# Patient Record
Sex: Male | Born: 1945 | Race: White | Hispanic: No | Marital: Married | Smoking: Never smoker
Health system: Southern US, Community
[De-identification: ages and names within clinical notes are randomized; demographics above are authoritative.]

## PROBLEM LIST (undated history)

## (undated) DIAGNOSIS — N189 Chronic kidney disease, unspecified: Secondary | ICD-10-CM

## (undated) DIAGNOSIS — I82409 Acute embolism and thrombosis of unspecified deep veins of unspecified lower extremity: Secondary | ICD-10-CM

## (undated) DIAGNOSIS — I10 Essential (primary) hypertension: Secondary | ICD-10-CM

## (undated) DIAGNOSIS — I4891 Unspecified atrial fibrillation: Secondary | ICD-10-CM

## (undated) DIAGNOSIS — C801 Malignant (primary) neoplasm, unspecified: Secondary | ICD-10-CM

## (undated) DIAGNOSIS — R809 Proteinuria, unspecified: Secondary | ICD-10-CM

## (undated) DIAGNOSIS — Z86711 Personal history of pulmonary embolism: Secondary | ICD-10-CM

## (undated) DIAGNOSIS — M1990 Unspecified osteoarthritis, unspecified site: Secondary | ICD-10-CM

## (undated) DIAGNOSIS — G4730 Sleep apnea, unspecified: Secondary | ICD-10-CM

## (undated) DIAGNOSIS — J189 Pneumonia, unspecified organism: Secondary | ICD-10-CM

## (undated) HISTORY — PX: MENISCUS REPAIR: SHX5179

## (undated) HISTORY — DX: Chronic kidney disease, unspecified: N18.9

## (undated) HISTORY — DX: Proteinuria, unspecified: R80.9

## (undated) HISTORY — DX: Personal history of pulmonary embolism: Z86.711

## (undated) HISTORY — DX: Unspecified atrial fibrillation: I48.91

## (undated) HISTORY — PX: CATARACT EXTRACTION: SUR2

## (undated) HISTORY — DX: Acute embolism and thrombosis of unspecified deep veins of unspecified lower extremity: I82.409

## (undated) HISTORY — PX: OTHER SURGICAL HISTORY: SHX169

## (undated) HISTORY — PX: ROTATOR CUFF REPAIR: SHX139

---

## 2007-01-20 ENCOUNTER — Encounter: Admission: RE | Admit: 2007-01-20 | Discharge: 2007-01-20 | Payer: Self-pay | Admitting: Family Medicine

## 2007-01-25 ENCOUNTER — Encounter: Admission: RE | Admit: 2007-01-25 | Discharge: 2007-01-25 | Payer: Self-pay | Admitting: Family Medicine

## 2007-03-01 ENCOUNTER — Encounter: Admission: RE | Admit: 2007-03-01 | Discharge: 2007-03-01 | Payer: Self-pay | Admitting: Family Medicine

## 2007-12-17 ENCOUNTER — Encounter: Admission: RE | Admit: 2007-12-17 | Discharge: 2007-12-17 | Payer: Self-pay | Admitting: Family Medicine

## 2014-08-28 DIAGNOSIS — B356 Tinea cruris: Secondary | ICD-10-CM | POA: Diagnosis not present

## 2014-08-28 DIAGNOSIS — Z6835 Body mass index (BMI) 35.0-35.9, adult: Secondary | ICD-10-CM | POA: Diagnosis not present

## 2014-08-28 DIAGNOSIS — R1310 Dysphagia, unspecified: Secondary | ICD-10-CM | POA: Diagnosis not present

## 2014-08-28 DIAGNOSIS — Z0001 Encounter for general adult medical examination with abnormal findings: Secondary | ICD-10-CM | POA: Diagnosis not present

## 2014-08-28 DIAGNOSIS — E669 Obesity, unspecified: Secondary | ICD-10-CM | POA: Diagnosis not present

## 2014-08-28 DIAGNOSIS — M109 Gout, unspecified: Secondary | ICD-10-CM | POA: Diagnosis not present

## 2014-08-28 DIAGNOSIS — L989 Disorder of the skin and subcutaneous tissue, unspecified: Secondary | ICD-10-CM | POA: Diagnosis not present

## 2014-08-28 DIAGNOSIS — R7301 Impaired fasting glucose: Secondary | ICD-10-CM | POA: Diagnosis not present

## 2014-08-29 ENCOUNTER — Other Ambulatory Visit: Payer: Self-pay | Admitting: Family Medicine

## 2014-08-29 DIAGNOSIS — R1310 Dysphagia, unspecified: Secondary | ICD-10-CM

## 2014-08-31 ENCOUNTER — Ambulatory Visit
Admission: RE | Admit: 2014-08-31 | Discharge: 2014-08-31 | Disposition: A | Discharge status: Home / Self Care | Payer: Commercial Managed Care - HMO | Source: Ambulatory Visit | Attending: Family Medicine | Admitting: Family Medicine

## 2014-08-31 DIAGNOSIS — R1310 Dysphagia, unspecified: Secondary | ICD-10-CM | POA: Diagnosis not present

## 2014-09-28 DIAGNOSIS — I10 Essential (primary) hypertension: Secondary | ICD-10-CM | POA: Diagnosis not present

## 2014-09-28 DIAGNOSIS — C4441 Basal cell carcinoma of skin of scalp and neck: Secondary | ICD-10-CM | POA: Diagnosis not present

## 2015-02-05 DIAGNOSIS — R7301 Impaired fasting glucose: Secondary | ICD-10-CM | POA: Diagnosis not present

## 2015-02-05 DIAGNOSIS — Z6834 Body mass index (BMI) 34.0-34.9, adult: Secondary | ICD-10-CM | POA: Diagnosis not present

## 2015-02-05 DIAGNOSIS — I10 Essential (primary) hypertension: Secondary | ICD-10-CM | POA: Diagnosis not present

## 2015-02-05 DIAGNOSIS — E669 Obesity, unspecified: Secondary | ICD-10-CM | POA: Diagnosis not present

## 2015-02-05 DIAGNOSIS — M109 Gout, unspecified: Secondary | ICD-10-CM | POA: Diagnosis not present

## 2015-04-22 DIAGNOSIS — H1033 Unspecified acute conjunctivitis, bilateral: Secondary | ICD-10-CM | POA: Diagnosis not present

## 2015-10-03 DIAGNOSIS — E669 Obesity, unspecified: Secondary | ICD-10-CM | POA: Diagnosis not present

## 2015-10-03 DIAGNOSIS — G4700 Insomnia, unspecified: Secondary | ICD-10-CM | POA: Diagnosis not present

## 2015-10-03 DIAGNOSIS — Z0000 Encounter for general adult medical examination without abnormal findings: Secondary | ICD-10-CM | POA: Diagnosis not present

## 2015-10-03 DIAGNOSIS — R7301 Impaired fasting glucose: Secondary | ICD-10-CM | POA: Diagnosis not present

## 2015-10-03 DIAGNOSIS — M109 Gout, unspecified: Secondary | ICD-10-CM | POA: Diagnosis not present

## 2015-10-03 DIAGNOSIS — Z6834 Body mass index (BMI) 34.0-34.9, adult: Secondary | ICD-10-CM | POA: Diagnosis not present

## 2015-10-03 DIAGNOSIS — Z125 Encounter for screening for malignant neoplasm of prostate: Secondary | ICD-10-CM | POA: Diagnosis not present

## 2015-10-03 DIAGNOSIS — Z1211 Encounter for screening for malignant neoplasm of colon: Secondary | ICD-10-CM | POA: Diagnosis not present

## 2015-10-03 DIAGNOSIS — I10 Essential (primary) hypertension: Secondary | ICD-10-CM | POA: Diagnosis not present

## 2015-12-04 DIAGNOSIS — Z125 Encounter for screening for malignant neoplasm of prostate: Secondary | ICD-10-CM | POA: Diagnosis not present

## 2016-05-06 DIAGNOSIS — G4700 Insomnia, unspecified: Secondary | ICD-10-CM | POA: Diagnosis not present

## 2016-05-06 DIAGNOSIS — R7301 Impaired fasting glucose: Secondary | ICD-10-CM | POA: Diagnosis not present

## 2016-05-06 DIAGNOSIS — R0681 Apnea, not elsewhere classified: Secondary | ICD-10-CM | POA: Diagnosis not present

## 2016-05-06 DIAGNOSIS — E7800 Pure hypercholesterolemia, unspecified: Secondary | ICD-10-CM | POA: Diagnosis not present

## 2016-05-06 DIAGNOSIS — M109 Gout, unspecified: Secondary | ICD-10-CM | POA: Diagnosis not present

## 2016-05-06 DIAGNOSIS — I10 Essential (primary) hypertension: Secondary | ICD-10-CM | POA: Diagnosis not present

## 2016-05-06 DIAGNOSIS — E669 Obesity, unspecified: Secondary | ICD-10-CM | POA: Diagnosis not present

## 2016-05-06 DIAGNOSIS — Z6834 Body mass index (BMI) 34.0-34.9, adult: Secondary | ICD-10-CM | POA: Diagnosis not present

## 2016-07-22 DIAGNOSIS — G4700 Insomnia, unspecified: Secondary | ICD-10-CM | POA: Diagnosis not present

## 2016-07-22 DIAGNOSIS — R0681 Apnea, not elsewhere classified: Secondary | ICD-10-CM | POA: Diagnosis not present

## 2016-08-04 DIAGNOSIS — G4733 Obstructive sleep apnea (adult) (pediatric): Secondary | ICD-10-CM | POA: Diagnosis not present

## 2016-08-05 DIAGNOSIS — G4733 Obstructive sleep apnea (adult) (pediatric): Secondary | ICD-10-CM | POA: Diagnosis not present

## 2016-08-27 DIAGNOSIS — G4733 Obstructive sleep apnea (adult) (pediatric): Secondary | ICD-10-CM | POA: Diagnosis not present

## 2016-09-27 DIAGNOSIS — G4733 Obstructive sleep apnea (adult) (pediatric): Secondary | ICD-10-CM | POA: Diagnosis not present

## 2016-10-25 DIAGNOSIS — G4733 Obstructive sleep apnea (adult) (pediatric): Secondary | ICD-10-CM | POA: Diagnosis not present

## 2016-11-17 DIAGNOSIS — R0902 Hypoxemia: Secondary | ICD-10-CM | POA: Diagnosis not present

## 2016-11-17 DIAGNOSIS — G4733 Obstructive sleep apnea (adult) (pediatric): Secondary | ICD-10-CM | POA: Diagnosis not present

## 2016-11-25 DIAGNOSIS — G4733 Obstructive sleep apnea (adult) (pediatric): Secondary | ICD-10-CM | POA: Diagnosis not present

## 2016-12-08 DIAGNOSIS — Z1159 Encounter for screening for other viral diseases: Secondary | ICD-10-CM | POA: Diagnosis not present

## 2016-12-08 DIAGNOSIS — Z0000 Encounter for general adult medical examination without abnormal findings: Secondary | ICD-10-CM | POA: Diagnosis not present

## 2016-12-08 DIAGNOSIS — G4700 Insomnia, unspecified: Secondary | ICD-10-CM | POA: Diagnosis not present

## 2016-12-08 DIAGNOSIS — Z125 Encounter for screening for malignant neoplasm of prostate: Secondary | ICD-10-CM | POA: Diagnosis not present

## 2016-12-08 DIAGNOSIS — M109 Gout, unspecified: Secondary | ICD-10-CM | POA: Diagnosis not present

## 2016-12-08 DIAGNOSIS — R7301 Impaired fasting glucose: Secondary | ICD-10-CM | POA: Diagnosis not present

## 2016-12-08 DIAGNOSIS — I10 Essential (primary) hypertension: Secondary | ICD-10-CM | POA: Diagnosis not present

## 2016-12-08 DIAGNOSIS — G4733 Obstructive sleep apnea (adult) (pediatric): Secondary | ICD-10-CM | POA: Diagnosis not present

## 2016-12-08 DIAGNOSIS — E669 Obesity, unspecified: Secondary | ICD-10-CM | POA: Diagnosis not present

## 2016-12-08 DIAGNOSIS — L570 Actinic keratosis: Secondary | ICD-10-CM | POA: Diagnosis not present

## 2016-12-25 DIAGNOSIS — G4733 Obstructive sleep apnea (adult) (pediatric): Secondary | ICD-10-CM | POA: Diagnosis not present

## 2017-01-25 DIAGNOSIS — G4733 Obstructive sleep apnea (adult) (pediatric): Secondary | ICD-10-CM | POA: Diagnosis not present

## 2017-02-02 DIAGNOSIS — K5730 Diverticulosis of large intestine without perforation or abscess without bleeding: Secondary | ICD-10-CM | POA: Diagnosis not present

## 2017-02-02 DIAGNOSIS — D175 Benign lipomatous neoplasm of intra-abdominal organs: Secondary | ICD-10-CM | POA: Diagnosis not present

## 2017-02-02 DIAGNOSIS — Z1211 Encounter for screening for malignant neoplasm of colon: Secondary | ICD-10-CM | POA: Diagnosis not present

## 2017-02-24 DIAGNOSIS — G4733 Obstructive sleep apnea (adult) (pediatric): Secondary | ICD-10-CM | POA: Diagnosis not present

## 2017-02-27 DIAGNOSIS — G4733 Obstructive sleep apnea (adult) (pediatric): Secondary | ICD-10-CM | POA: Diagnosis not present

## 2017-03-27 DIAGNOSIS — G4733 Obstructive sleep apnea (adult) (pediatric): Secondary | ICD-10-CM | POA: Diagnosis not present

## 2017-04-27 DIAGNOSIS — G4733 Obstructive sleep apnea (adult) (pediatric): Secondary | ICD-10-CM | POA: Diagnosis not present

## 2017-05-05 DIAGNOSIS — H04123 Dry eye syndrome of bilateral lacrimal glands: Secondary | ICD-10-CM | POA: Diagnosis not present

## 2017-05-05 DIAGNOSIS — H25013 Cortical age-related cataract, bilateral: Secondary | ICD-10-CM | POA: Diagnosis not present

## 2017-05-05 DIAGNOSIS — H47092 Other disorders of optic nerve, not elsewhere classified, left eye: Secondary | ICD-10-CM | POA: Diagnosis not present

## 2017-05-05 DIAGNOSIS — H11423 Conjunctival edema, bilateral: Secondary | ICD-10-CM | POA: Diagnosis not present

## 2017-05-05 DIAGNOSIS — H353131 Nonexudative age-related macular degeneration, bilateral, early dry stage: Secondary | ICD-10-CM | POA: Diagnosis not present

## 2017-05-05 DIAGNOSIS — H18413 Arcus senilis, bilateral: Secondary | ICD-10-CM | POA: Diagnosis not present

## 2017-05-05 DIAGNOSIS — H11041 Peripheral pterygium, stationary, right eye: Secondary | ICD-10-CM | POA: Diagnosis not present

## 2017-05-05 DIAGNOSIS — H40013 Open angle with borderline findings, low risk, bilateral: Secondary | ICD-10-CM | POA: Diagnosis not present

## 2017-05-05 DIAGNOSIS — H11823 Conjunctivochalasis, bilateral: Secondary | ICD-10-CM | POA: Diagnosis not present

## 2017-05-27 DIAGNOSIS — G4733 Obstructive sleep apnea (adult) (pediatric): Secondary | ICD-10-CM | POA: Diagnosis not present

## 2017-06-09 DIAGNOSIS — H401424 Capsular glaucoma with pseudoexfoliation of lens, left eye, indeterminate stage: Secondary | ICD-10-CM | POA: Diagnosis not present

## 2017-06-09 DIAGNOSIS — H43812 Vitreous degeneration, left eye: Secondary | ICD-10-CM | POA: Diagnosis not present

## 2017-06-09 DIAGNOSIS — H2512 Age-related nuclear cataract, left eye: Secondary | ICD-10-CM | POA: Diagnosis not present

## 2017-06-09 DIAGNOSIS — H353121 Nonexudative age-related macular degeneration, left eye, early dry stage: Secondary | ICD-10-CM | POA: Diagnosis not present

## 2017-06-09 DIAGNOSIS — H2513 Age-related nuclear cataract, bilateral: Secondary | ICD-10-CM | POA: Diagnosis not present

## 2017-06-09 DIAGNOSIS — H2511 Age-related nuclear cataract, right eye: Secondary | ICD-10-CM | POA: Diagnosis not present

## 2017-06-15 DIAGNOSIS — R7301 Impaired fasting glucose: Secondary | ICD-10-CM | POA: Diagnosis not present

## 2017-06-15 DIAGNOSIS — I10 Essential (primary) hypertension: Secondary | ICD-10-CM | POA: Diagnosis not present

## 2017-06-15 DIAGNOSIS — G4733 Obstructive sleep apnea (adult) (pediatric): Secondary | ICD-10-CM | POA: Diagnosis not present

## 2017-06-15 DIAGNOSIS — M109 Gout, unspecified: Secondary | ICD-10-CM | POA: Diagnosis not present

## 2017-06-15 DIAGNOSIS — Z6835 Body mass index (BMI) 35.0-35.9, adult: Secondary | ICD-10-CM | POA: Diagnosis not present

## 2017-06-18 DIAGNOSIS — H2512 Age-related nuclear cataract, left eye: Secondary | ICD-10-CM | POA: Diagnosis not present

## 2017-06-27 DIAGNOSIS — G4733 Obstructive sleep apnea (adult) (pediatric): Secondary | ICD-10-CM | POA: Diagnosis not present

## 2017-08-25 DIAGNOSIS — H2511 Age-related nuclear cataract, right eye: Secondary | ICD-10-CM | POA: Diagnosis not present

## 2017-08-27 DIAGNOSIS — H2511 Age-related nuclear cataract, right eye: Secondary | ICD-10-CM | POA: Diagnosis not present

## 2017-08-28 DIAGNOSIS — G4733 Obstructive sleep apnea (adult) (pediatric): Secondary | ICD-10-CM | POA: Diagnosis not present

## 2017-11-19 DIAGNOSIS — M549 Dorsalgia, unspecified: Secondary | ICD-10-CM | POA: Diagnosis not present

## 2017-11-23 DIAGNOSIS — G4733 Obstructive sleep apnea (adult) (pediatric): Secondary | ICD-10-CM | POA: Diagnosis not present

## 2017-12-01 DIAGNOSIS — Z961 Presence of intraocular lens: Secondary | ICD-10-CM | POA: Diagnosis not present

## 2017-12-04 DIAGNOSIS — G4733 Obstructive sleep apnea (adult) (pediatric): Secondary | ICD-10-CM | POA: Diagnosis not present

## 2018-01-04 DIAGNOSIS — M109 Gout, unspecified: Secondary | ICD-10-CM | POA: Diagnosis not present

## 2018-01-04 DIAGNOSIS — Z0000 Encounter for general adult medical examination without abnormal findings: Secondary | ICD-10-CM | POA: Diagnosis not present

## 2018-01-04 DIAGNOSIS — G4733 Obstructive sleep apnea (adult) (pediatric): Secondary | ICD-10-CM | POA: Diagnosis not present

## 2018-01-04 DIAGNOSIS — E669 Obesity, unspecified: Secondary | ICD-10-CM | POA: Diagnosis not present

## 2018-01-04 DIAGNOSIS — Z125 Encounter for screening for malignant neoplasm of prostate: Secondary | ICD-10-CM | POA: Diagnosis not present

## 2018-01-04 DIAGNOSIS — Z6834 Body mass index (BMI) 34.0-34.9, adult: Secondary | ICD-10-CM | POA: Diagnosis not present

## 2018-01-04 DIAGNOSIS — R7301 Impaired fasting glucose: Secondary | ICD-10-CM | POA: Diagnosis not present

## 2018-01-04 DIAGNOSIS — I10 Essential (primary) hypertension: Secondary | ICD-10-CM | POA: Diagnosis not present

## 2018-07-09 DIAGNOSIS — R7301 Impaired fasting glucose: Secondary | ICD-10-CM | POA: Diagnosis not present

## 2018-07-09 DIAGNOSIS — I10 Essential (primary) hypertension: Secondary | ICD-10-CM | POA: Diagnosis not present

## 2018-07-09 DIAGNOSIS — M109 Gout, unspecified: Secondary | ICD-10-CM | POA: Diagnosis not present

## 2018-07-09 DIAGNOSIS — D235 Other benign neoplasm of skin of trunk: Secondary | ICD-10-CM | POA: Diagnosis not present

## 2018-07-09 DIAGNOSIS — E669 Obesity, unspecified: Secondary | ICD-10-CM | POA: Diagnosis not present

## 2018-07-09 DIAGNOSIS — Z6835 Body mass index (BMI) 35.0-35.9, adult: Secondary | ICD-10-CM | POA: Diagnosis not present

## 2018-11-29 DIAGNOSIS — G4733 Obstructive sleep apnea (adult) (pediatric): Secondary | ICD-10-CM | POA: Diagnosis not present

## 2019-01-20 DIAGNOSIS — G4733 Obstructive sleep apnea (adult) (pediatric): Secondary | ICD-10-CM | POA: Diagnosis not present

## 2019-02-03 DIAGNOSIS — M109 Gout, unspecified: Secondary | ICD-10-CM | POA: Diagnosis not present

## 2019-02-03 DIAGNOSIS — I10 Essential (primary) hypertension: Secondary | ICD-10-CM | POA: Diagnosis not present

## 2019-02-03 DIAGNOSIS — R7301 Impaired fasting glucose: Secondary | ICD-10-CM | POA: Diagnosis not present

## 2019-02-03 DIAGNOSIS — Z125 Encounter for screening for malignant neoplasm of prostate: Secondary | ICD-10-CM | POA: Diagnosis not present

## 2019-02-07 DIAGNOSIS — G4733 Obstructive sleep apnea (adult) (pediatric): Secondary | ICD-10-CM | POA: Diagnosis not present

## 2019-02-07 DIAGNOSIS — M549 Dorsalgia, unspecified: Secondary | ICD-10-CM | POA: Diagnosis not present

## 2019-02-07 DIAGNOSIS — M109 Gout, unspecified: Secondary | ICD-10-CM | POA: Diagnosis not present

## 2019-02-07 DIAGNOSIS — I10 Essential (primary) hypertension: Secondary | ICD-10-CM | POA: Diagnosis not present

## 2019-02-07 DIAGNOSIS — N179 Acute kidney failure, unspecified: Secondary | ICD-10-CM | POA: Diagnosis not present

## 2019-02-07 DIAGNOSIS — Z0000 Encounter for general adult medical examination without abnormal findings: Secondary | ICD-10-CM | POA: Diagnosis not present

## 2019-02-07 DIAGNOSIS — Z125 Encounter for screening for malignant neoplasm of prostate: Secondary | ICD-10-CM | POA: Diagnosis not present

## 2019-02-07 DIAGNOSIS — R7301 Impaired fasting glucose: Secondary | ICD-10-CM | POA: Diagnosis not present

## 2019-03-15 DIAGNOSIS — D2262 Melanocytic nevi of left upper limb, including shoulder: Secondary | ICD-10-CM | POA: Diagnosis not present

## 2019-03-15 DIAGNOSIS — D225 Melanocytic nevi of trunk: Secondary | ICD-10-CM | POA: Diagnosis not present

## 2019-03-15 DIAGNOSIS — D2261 Melanocytic nevi of right upper limb, including shoulder: Secondary | ICD-10-CM | POA: Diagnosis not present

## 2019-03-15 DIAGNOSIS — L821 Other seborrheic keratosis: Secondary | ICD-10-CM | POA: Diagnosis not present

## 2019-03-15 DIAGNOSIS — D1801 Hemangioma of skin and subcutaneous tissue: Secondary | ICD-10-CM | POA: Diagnosis not present

## 2019-04-11 DIAGNOSIS — M109 Gout, unspecified: Secondary | ICD-10-CM | POA: Diagnosis not present

## 2019-04-11 DIAGNOSIS — I10 Essential (primary) hypertension: Secondary | ICD-10-CM | POA: Diagnosis not present

## 2019-04-25 DIAGNOSIS — G4733 Obstructive sleep apnea (adult) (pediatric): Secondary | ICD-10-CM | POA: Diagnosis not present

## 2019-07-26 DIAGNOSIS — G4733 Obstructive sleep apnea (adult) (pediatric): Secondary | ICD-10-CM | POA: Diagnosis not present

## 2019-08-04 DIAGNOSIS — R7301 Impaired fasting glucose: Secondary | ICD-10-CM | POA: Diagnosis not present

## 2019-08-04 DIAGNOSIS — M109 Gout, unspecified: Secondary | ICD-10-CM | POA: Diagnosis not present

## 2019-08-08 DIAGNOSIS — R7301 Impaired fasting glucose: Secondary | ICD-10-CM | POA: Diagnosis not present

## 2019-08-08 DIAGNOSIS — Z6834 Body mass index (BMI) 34.0-34.9, adult: Secondary | ICD-10-CM | POA: Diagnosis not present

## 2019-08-08 DIAGNOSIS — M109 Gout, unspecified: Secondary | ICD-10-CM | POA: Diagnosis not present

## 2019-08-08 DIAGNOSIS — I10 Essential (primary) hypertension: Secondary | ICD-10-CM | POA: Diagnosis not present

## 2019-08-08 DIAGNOSIS — E669 Obesity, unspecified: Secondary | ICD-10-CM | POA: Diagnosis not present

## 2019-08-08 DIAGNOSIS — Z7189 Other specified counseling: Secondary | ICD-10-CM | POA: Diagnosis not present

## 2019-10-25 DIAGNOSIS — G4733 Obstructive sleep apnea (adult) (pediatric): Secondary | ICD-10-CM | POA: Diagnosis not present

## 2019-11-30 DIAGNOSIS — G4733 Obstructive sleep apnea (adult) (pediatric): Secondary | ICD-10-CM | POA: Diagnosis not present

## 2019-12-05 DIAGNOSIS — G4733 Obstructive sleep apnea (adult) (pediatric): Secondary | ICD-10-CM | POA: Diagnosis not present

## 2020-02-09 DIAGNOSIS — M109 Gout, unspecified: Secondary | ICD-10-CM | POA: Diagnosis not present

## 2020-02-09 DIAGNOSIS — I10 Essential (primary) hypertension: Secondary | ICD-10-CM | POA: Diagnosis not present

## 2020-02-09 DIAGNOSIS — R7309 Other abnormal glucose: Secondary | ICD-10-CM | POA: Diagnosis not present

## 2020-02-09 DIAGNOSIS — E669 Obesity, unspecified: Secondary | ICD-10-CM | POA: Diagnosis not present

## 2020-02-09 DIAGNOSIS — Z125 Encounter for screening for malignant neoplasm of prostate: Secondary | ICD-10-CM | POA: Diagnosis not present

## 2020-02-09 DIAGNOSIS — N529 Male erectile dysfunction, unspecified: Secondary | ICD-10-CM | POA: Diagnosis not present

## 2020-02-09 DIAGNOSIS — Z0000 Encounter for general adult medical examination without abnormal findings: Secondary | ICD-10-CM | POA: Diagnosis not present

## 2020-02-09 DIAGNOSIS — G4733 Obstructive sleep apnea (adult) (pediatric): Secondary | ICD-10-CM | POA: Diagnosis not present

## 2020-11-29 DIAGNOSIS — G4733 Obstructive sleep apnea (adult) (pediatric): Secondary | ICD-10-CM | POA: Diagnosis not present

## 2020-11-29 DIAGNOSIS — G4700 Insomnia, unspecified: Secondary | ICD-10-CM | POA: Diagnosis not present

## 2020-11-29 DIAGNOSIS — I10 Essential (primary) hypertension: Secondary | ICD-10-CM | POA: Diagnosis not present

## 2020-11-29 DIAGNOSIS — E669 Obesity, unspecified: Secondary | ICD-10-CM | POA: Diagnosis not present

## 2020-11-29 DIAGNOSIS — R7301 Impaired fasting glucose: Secondary | ICD-10-CM | POA: Diagnosis not present

## 2020-12-06 DIAGNOSIS — G4733 Obstructive sleep apnea (adult) (pediatric): Secondary | ICD-10-CM | POA: Diagnosis not present

## 2020-12-06 DIAGNOSIS — C4491 Basal cell carcinoma of skin, unspecified: Secondary | ICD-10-CM | POA: Diagnosis not present

## 2020-12-19 DIAGNOSIS — L84 Corns and callosities: Secondary | ICD-10-CM | POA: Diagnosis not present

## 2020-12-19 DIAGNOSIS — L820 Inflamed seborrheic keratosis: Secondary | ICD-10-CM | POA: Diagnosis not present

## 2020-12-19 DIAGNOSIS — D1801 Hemangioma of skin and subcutaneous tissue: Secondary | ICD-10-CM | POA: Diagnosis not present

## 2020-12-19 DIAGNOSIS — D2261 Melanocytic nevi of right upper limb, including shoulder: Secondary | ICD-10-CM | POA: Diagnosis not present

## 2020-12-19 DIAGNOSIS — L821 Other seborrheic keratosis: Secondary | ICD-10-CM | POA: Diagnosis not present

## 2020-12-19 DIAGNOSIS — C44319 Basal cell carcinoma of skin of other parts of face: Secondary | ICD-10-CM | POA: Diagnosis not present

## 2020-12-19 DIAGNOSIS — D225 Melanocytic nevi of trunk: Secondary | ICD-10-CM | POA: Diagnosis not present

## 2020-12-19 DIAGNOSIS — L814 Other melanin hyperpigmentation: Secondary | ICD-10-CM | POA: Diagnosis not present

## 2020-12-19 DIAGNOSIS — C44311 Basal cell carcinoma of skin of nose: Secondary | ICD-10-CM | POA: Diagnosis not present

## 2020-12-27 DIAGNOSIS — G4733 Obstructive sleep apnea (adult) (pediatric): Secondary | ICD-10-CM | POA: Diagnosis not present

## 2021-01-07 DIAGNOSIS — C44319 Basal cell carcinoma of skin of other parts of face: Secondary | ICD-10-CM | POA: Diagnosis not present

## 2021-01-07 DIAGNOSIS — C44311 Basal cell carcinoma of skin of nose: Secondary | ICD-10-CM | POA: Diagnosis not present

## 2021-01-07 DIAGNOSIS — Z85828 Personal history of other malignant neoplasm of skin: Secondary | ICD-10-CM | POA: Diagnosis not present

## 2021-01-14 DIAGNOSIS — C44319 Basal cell carcinoma of skin of other parts of face: Secondary | ICD-10-CM | POA: Diagnosis not present

## 2021-01-14 DIAGNOSIS — Z85828 Personal history of other malignant neoplasm of skin: Secondary | ICD-10-CM | POA: Diagnosis not present

## 2021-02-01 DIAGNOSIS — G4733 Obstructive sleep apnea (adult) (pediatric): Secondary | ICD-10-CM | POA: Diagnosis not present

## 2021-03-31 DIAGNOSIS — G500 Trigeminal neuralgia: Secondary | ICD-10-CM | POA: Diagnosis not present

## 2021-03-31 DIAGNOSIS — G501 Atypical facial pain: Secondary | ICD-10-CM | POA: Diagnosis not present

## 2021-05-31 DIAGNOSIS — M5136 Other intervertebral disc degeneration, lumbar region: Secondary | ICD-10-CM | POA: Diagnosis not present

## 2021-06-26 DIAGNOSIS — G4733 Obstructive sleep apnea (adult) (pediatric): Secondary | ICD-10-CM | POA: Diagnosis not present

## 2021-06-26 DIAGNOSIS — E669 Obesity, unspecified: Secondary | ICD-10-CM | POA: Diagnosis not present

## 2021-06-26 DIAGNOSIS — N529 Male erectile dysfunction, unspecified: Secondary | ICD-10-CM | POA: Diagnosis not present

## 2021-06-26 DIAGNOSIS — M109 Gout, unspecified: Secondary | ICD-10-CM | POA: Diagnosis not present

## 2021-06-26 DIAGNOSIS — I10 Essential (primary) hypertension: Secondary | ICD-10-CM | POA: Diagnosis not present

## 2021-06-26 DIAGNOSIS — Z0000 Encounter for general adult medical examination without abnormal findings: Secondary | ICD-10-CM | POA: Diagnosis not present

## 2021-06-26 DIAGNOSIS — G4700 Insomnia, unspecified: Secondary | ICD-10-CM | POA: Diagnosis not present

## 2021-06-26 DIAGNOSIS — M5136 Other intervertebral disc degeneration, lumbar region: Secondary | ICD-10-CM | POA: Diagnosis not present

## 2021-06-26 DIAGNOSIS — R7303 Prediabetes: Secondary | ICD-10-CM | POA: Diagnosis not present

## 2021-12-12 DIAGNOSIS — G4733 Obstructive sleep apnea (adult) (pediatric): Secondary | ICD-10-CM | POA: Diagnosis not present

## 2021-12-24 ENCOUNTER — Emergency Department (HOSPITAL_COMMUNITY): Payer: Medicare HMO

## 2021-12-24 ENCOUNTER — Encounter (HOSPITAL_COMMUNITY): Payer: Self-pay

## 2021-12-24 ENCOUNTER — Inpatient Hospital Stay (HOSPITAL_COMMUNITY)
Admission: EM | Admit: 2021-12-24 | Discharge: 2021-12-28 | DRG: 175 | Disposition: A | Discharge status: Home / Self Care | Payer: Medicare HMO | Source: Ambulatory Visit | Attending: Internal Medicine | Admitting: Internal Medicine

## 2021-12-24 ENCOUNTER — Other Ambulatory Visit: Payer: Self-pay

## 2021-12-24 DIAGNOSIS — R55 Syncope and collapse: Secondary | ICD-10-CM | POA: Diagnosis not present

## 2021-12-24 DIAGNOSIS — I248 Other forms of acute ischemic heart disease: Secondary | ICD-10-CM | POA: Diagnosis present

## 2021-12-24 DIAGNOSIS — Z86711 Personal history of pulmonary embolism: Secondary | ICD-10-CM | POA: Diagnosis present

## 2021-12-24 DIAGNOSIS — I10 Essential (primary) hypertension: Secondary | ICD-10-CM | POA: Diagnosis present

## 2021-12-24 DIAGNOSIS — C7931 Secondary malignant neoplasm of brain: Secondary | ICD-10-CM | POA: Diagnosis not present

## 2021-12-24 DIAGNOSIS — K5730 Diverticulosis of large intestine without perforation or abscess without bleeding: Secondary | ICD-10-CM | POA: Diagnosis not present

## 2021-12-24 DIAGNOSIS — I2692 Saddle embolus of pulmonary artery without acute cor pulmonale: Secondary | ICD-10-CM

## 2021-12-24 DIAGNOSIS — I2609 Other pulmonary embolism with acute cor pulmonale: Secondary | ICD-10-CM | POA: Diagnosis not present

## 2021-12-24 DIAGNOSIS — Q630 Accessory kidney: Secondary | ICD-10-CM | POA: Diagnosis not present

## 2021-12-24 DIAGNOSIS — I2602 Saddle embolus of pulmonary artery with acute cor pulmonale: Secondary | ICD-10-CM | POA: Diagnosis not present

## 2021-12-24 DIAGNOSIS — N2889 Other specified disorders of kidney and ureter: Secondary | ICD-10-CM | POA: Diagnosis not present

## 2021-12-24 DIAGNOSIS — Z6834 Body mass index (BMI) 34.0-34.9, adult: Secondary | ICD-10-CM | POA: Diagnosis not present

## 2021-12-24 DIAGNOSIS — I700 Atherosclerosis of aorta: Secondary | ICD-10-CM | POA: Diagnosis not present

## 2021-12-24 DIAGNOSIS — Z20822 Contact with and (suspected) exposure to covid-19: Secondary | ICD-10-CM | POA: Diagnosis present

## 2021-12-24 DIAGNOSIS — M109 Gout, unspecified: Secondary | ICD-10-CM | POA: Diagnosis present

## 2021-12-24 DIAGNOSIS — R0602 Shortness of breath: Secondary | ICD-10-CM

## 2021-12-24 DIAGNOSIS — I517 Cardiomegaly: Secondary | ICD-10-CM | POA: Diagnosis not present

## 2021-12-24 DIAGNOSIS — R001 Bradycardia, unspecified: Secondary | ICD-10-CM | POA: Diagnosis present

## 2021-12-24 DIAGNOSIS — I214 Non-ST elevation (NSTEMI) myocardial infarction: Secondary | ICD-10-CM | POA: Diagnosis not present

## 2021-12-24 DIAGNOSIS — I639 Cerebral infarction, unspecified: Secondary | ICD-10-CM | POA: Diagnosis not present

## 2021-12-24 DIAGNOSIS — G4733 Obstructive sleep apnea (adult) (pediatric): Secondary | ICD-10-CM | POA: Diagnosis present

## 2021-12-24 DIAGNOSIS — E669 Obesity, unspecified: Secondary | ICD-10-CM | POA: Diagnosis present

## 2021-12-24 DIAGNOSIS — R509 Fever, unspecified: Secondary | ICD-10-CM | POA: Diagnosis not present

## 2021-12-24 DIAGNOSIS — Z880 Allergy status to penicillin: Secondary | ICD-10-CM

## 2021-12-24 DIAGNOSIS — I2699 Other pulmonary embolism without acute cor pulmonale: Secondary | ICD-10-CM | POA: Diagnosis not present

## 2021-12-24 HISTORY — DX: Malignant (primary) neoplasm, unspecified: C80.1

## 2021-12-24 HISTORY — DX: Sleep apnea, unspecified: G47.30

## 2021-12-24 LAB — COMPREHENSIVE METABOLIC PANEL
ALT: 29 U/L (ref 0–44)
AST: 31 U/L (ref 15–41)
Albumin: 4 g/dL (ref 3.5–5.0)
Alkaline Phosphatase: 65 U/L (ref 38–126)
Anion gap: 5 (ref 5–15)
BUN: 20 mg/dL (ref 8–23)
CO2: 26 mmol/L (ref 22–32)
Calcium: 9.2 mg/dL (ref 8.9–10.3)
Chloride: 107 mmol/L (ref 98–111)
Creatinine, Ser: 1.33 mg/dL — ABNORMAL HIGH (ref 0.61–1.24)
GFR, Estimated: 56 mL/min — ABNORMAL LOW (ref >60)
Glucose, Bld: 113 mg/dL — ABNORMAL HIGH (ref 70–99)
Potassium: 5.1 mmol/L (ref 3.5–5.1)
Sodium: 138 mmol/L (ref 135–145)
Total Bilirubin: 1.4 mg/dL — ABNORMAL HIGH (ref 0.3–1.2)
Total Protein: 7.5 g/dL (ref 6.5–8.1)

## 2021-12-24 LAB — CBC
HCT: 49.9 % (ref 39.0–52.0)
Hemoglobin: 16.6 g/dL (ref 13.0–17.0)
MCH: 30.8 pg (ref 26.0–34.0)
MCHC: 33.3 g/dL (ref 30.0–36.0)
MCV: 92.6 fL (ref 80.0–100.0)
Platelets: 177 10*3/uL (ref 150–400)
RBC: 5.39 MIL/uL (ref 4.22–5.81)
RDW: 13.4 % (ref 11.5–15.5)
WBC: 8.7 10*3/uL (ref 4.0–10.5)
nRBC: 0 % (ref 0.0–0.2)

## 2021-12-24 LAB — TROPONIN I (HIGH SENSITIVITY)
Troponin I (High Sensitivity): 788 ng/L (ref <18)
Troponin I (High Sensitivity): 670 ng/L (ref <18)

## 2021-12-24 LAB — BRAIN NATRIURETIC PEPTIDE: B Natriuretic Peptide: 350.7 pg/mL — ABNORMAL HIGH (ref 0.0–100.0)

## 2021-12-24 MED ORDER — IOHEXOL 300 MG/ML  SOLN
50.0000 mL | Freq: Once | INTRAMUSCULAR | Status: AC | PRN
  Administered 2021-12-25: 10 mL via INTRAVENOUS

## 2021-12-24 MED ORDER — DOCUSATE SODIUM 100 MG PO CAPS: 100.0000 mg | ORAL_CAPSULE | Freq: Two times a day (BID) | ORAL | Status: DC | PRN

## 2021-12-24 MED ORDER — MIDAZOLAM HCL 2 MG/2ML IJ SOLN
INTRAMUSCULAR | Status: AC
  Filled 2021-12-24: qty 4

## 2021-12-24 MED ORDER — FENTANYL CITRATE (PF) 100 MCG/2ML IJ SOLN
INTRAMUSCULAR | Status: AC
  Filled 2021-12-24: qty 4

## 2021-12-24 MED ORDER — SODIUM CHLORIDE 0.9% FLUSH
3.0000 mL | Freq: Two times a day (BID) | INTRAVENOUS | Status: DC
  Administered 2021-12-25 – 2021-12-28 (×8): 3 mL via INTRAVENOUS

## 2021-12-24 MED ORDER — HEPARIN (PORCINE) 25000 UT/250ML-% IV SOLN
1650.0000 [IU]/h | INTRAVENOUS | Status: DC
  Administered 2021-12-24 – 2021-12-25 (×2): 1600 [IU]/h via INTRAVENOUS
  Administered 2021-12-25 – 2021-12-27 (×4): 1650 [IU]/h via INTRAVENOUS
  Filled 2021-12-24 (×6): qty 250

## 2021-12-24 MED ORDER — ACETAMINOPHEN 325 MG PO TABS: 650.0000 mg | ORAL_TABLET | ORAL | Status: DC | PRN

## 2021-12-24 MED ORDER — LIDOCAINE HCL 1 % IJ SOLN
INTRAMUSCULAR | Status: AC
Start: 2021-12-24 — End: 2021-12-25
  Filled 2021-12-24: qty 20

## 2021-12-24 MED ORDER — POLYETHYLENE GLYCOL 3350 17 G PO PACK: 17.0000 g | PACK | Freq: Every day | ORAL | Status: DC | PRN

## 2021-12-24 MED ORDER — MIDAZOLAM HCL 2 MG/2ML IJ SOLN
INTRAMUSCULAR | Status: DC | PRN
Start: 2021-12-24 — End: 2021-12-26
  Administered 2021-12-24: .5 mg via INTRAVENOUS

## 2021-12-24 MED ORDER — SODIUM CHLORIDE 0.9% FLUSH: 3.0000 mL | INTRAVENOUS | Status: DC | PRN

## 2021-12-24 MED ORDER — HEPARIN BOLUS VIA INFUSION
5000.0000 [IU] | INTRAVENOUS | Status: AC
  Administered 2021-12-24: 5000 [IU] via INTRAVENOUS
  Filled 2021-12-24: qty 5000

## 2021-12-24 MED ORDER — DEXTROSE-NACL 5-0.45 % IV SOLN: INTRAVENOUS | Status: DC

## 2021-12-24 MED ORDER — FENTANYL CITRATE (PF) 100 MCG/2ML IJ SOLN
INTRAMUSCULAR | Status: DC | PRN
  Administered 2021-12-24: 25 ug via INTRAVENOUS

## 2021-12-24 MED ORDER — ONDANSETRON HCL 4 MG/2ML IJ SOLN: 4.0000 mg | Freq: Four times a day (QID) | INTRAMUSCULAR | Status: DC | PRN

## 2021-12-24 MED ORDER — IOHEXOL 350 MG/ML SOLN
100.0000 mL | Freq: Once | INTRAVENOUS | Status: AC | PRN
  Administered 2021-12-24: 100 mL via INTRAVENOUS

## 2021-12-24 MED ORDER — DEXTROSE 5 % AND 0.45 % NACL IV BOLUS
1000.0000 mL | Freq: Once | INTRAVENOUS | Status: AC
  Administered 2021-12-25: 1000 mL via INTRAVENOUS

## 2021-12-24 MED ORDER — SODIUM CHLORIDE 0.9 % IV SOLN: INTRAVENOUS | Status: DC

## 2021-12-24 MED ORDER — SODIUM CHLORIDE 0.9 % IV SOLN: 250.0000 mL | INTRAVENOUS | Status: DC | PRN

## 2021-12-24 MED ORDER — PANTOPRAZOLE SODIUM 40 MG PO TBEC
40.0000 mg | DELAYED_RELEASE_TABLET | Freq: Every day | ORAL | Status: DC
  Administered 2021-12-25 – 2021-12-28 (×4): 40 mg via ORAL
  Filled 2021-12-24 (×4): qty 1

## 2021-12-24 MED ORDER — SODIUM CHLORIDE 0.9 % IV SOLN
12.0000 mg | Freq: Once | INTRAVENOUS | Status: AC
  Administered 2021-12-25: 12 mg via INTRAVENOUS
  Filled 2021-12-24: qty 12

## 2021-12-24 MED ORDER — IOHEXOL 300 MG/ML  SOLN
75.0000 mL | Freq: Once | INTRAMUSCULAR | Status: AC | PRN
  Administered 2021-12-24: 75 mL via INTRAVENOUS

## 2021-12-24 NOTE — ED Triage Notes (Addendum)
Patient states that he has been having SOB last night. Patient states he uses a CPAP at night, but SOB was worse. Patient also reports that he was having dizziness while splitting wood yesterday and that only lasted on for a few minutes. ?Patient went to an UC today and was sent to the ED for frequent PVC's on their EKG that they obtained. ?

## 2021-12-24 NOTE — ED Notes (Signed)
Patient currently in IR. ?

## 2021-12-24 NOTE — Progress Notes (Signed)
ANTICOAGULATION CONSULT NOTE - Initial Consult ? ?Pharmacy Consult for IV heparin ?Indication: pulmonary embolus ? ?Allergies  ?Allergen Reactions  ? Penicillins   ?  Did it involve swelling of the face/tongue/throat, SOB, or low BP? Yes ?Did it involve sudden or severe rash/hives, skin peeling, or any reaction on the inside of your mouth or nose? Yes ?Did you need to seek medical attention at a hospital or doctor's office? No ?When did it last happen?    childhood   ?If all above answers are "NO", may proceed with cephalosporin use. ? ?  ? Prednisone   ?  Constipation  ? ? ?Patient Measurements: ?Height: 5' 10.5" (179.1 cm) ?Weight: 107 kg (236 lb) ?IBW/kg (Calculated) : 74.15 ?Heparin Dosing Weight: 97 kg ? ?Vital Signs: ?Temp: 97.8 ?F (36.6 ?C) (05/02 1434) ?Temp Source: Oral (05/02 1434) ?BP: 133/88 (05/02 1630) ?Pulse Rate: 40 (05/02 1630) ? ?Labs: ?Recent Labs  ?  12/24/21 ?1325  ?HGB 16.6  ?HCT 49.9  ?PLT 177  ?CREATININE 1.33*  ?TROPONINIHS 788*  ? ? ?Estimated Creatinine Clearance: 60.2 mL/min (A) (by C-G formula based on SCr of 1.33 mg/dL (H)). ? ? ?Medical History: ?Past Medical History:  ?Diagnosis Date  ? Cancer [facility])   ? Sleep apnea   ? ? ?Assessment: ?23 y/oM who presented to [facility] ED with acute shortness of breath and abdominal pain. High sensitivity troponin 788 > 670. CTa + for large saddle PE with evidence of right heart strain. Pharmacy consulted for IV heparin dosing for PE. Patient not on anticoagulants PTA. CBC WNL.  ? ?Goal of Therapy:  ?Heparin level 0.3-0.7 units/ml ?Monitor platelets by anticoagulation protocol: Yes ?  ?Plan:  ?Baseline aPTT, PT/INR ?Heparin 5000 units IV bolus x 1, then start heparin infusion at 1600 units/hr ?Heparin level 8 hours after initiation  ?Daily CBC, heparin level ?Monitor closely for s/sx of bleeding ? ? ?Lindell Spar, PharmD, BCPS ?Clinical Pharmacist ?12/24/2021,5:58 PM ? ? ?

## 2021-12-24 NOTE — Consult Note (Signed)
? ?Chief Complaint: ?SOB ? ?Referring Physician(s): ?Pulmonary/CC  ? ?Supervising Physician: Corrie Mckusick ? ?Patient Status: [facility] - ED ? ?History of Present Illness: ?Stanley Juarez is a 76 y.o. male presenting to the [facility] ED today with new onset SOB.  ? ?He tells me that he was chopping wood yesterday for a friend of his, a woman from his church, since a tree fell in the yard and she needed help clearing this.  He report near syncope, but did not pass out.  He denies ever having any episode like this previously.  He denies chest pain.  He says he has had some R>L lower extremity swelling recently, but denies pain or warmth.  He was evaluated in a local urgent care, and because of an arrhythmia, he was referred to the Ed.  ? ?CTA C/A/P was performed.  This demonstrates "saddle" type PE with a RV/LV ratio of 1.1.   ? ?Vital signs: ?BP: 133-176 / 65-89 ?HR: 40-92 ?Temp: afebrile ?RR: 15-21 ?O2: 93-97% RA ? ?Labs: ?Trop: 788 ?BNP: 350 ?H&H: 16.6/49.9 ?Platelet:177 ?Cr: 1.3 ?BUN: 20 ?GFR: 56 ?Tbili: 1.4 ? ?EKG: NSR.  Neg for BBB ? ?CTA CAP also shows right renal mass ~7cm, suspicious for RCC. There is a CL renal mass, 2-3cm.  ?Head CT + contrast: normal ? ?Simplified PESI: +1 (cancer) ? ?Past Medical History:  ?Diagnosis Date  ? Cancer [facility])   ? Sleep apnea   ? ? ?Past Surgical History:  ?Procedure Laterality Date  ? skin cancer removal    ? ? ?Allergies: ?Penicillins and Prednisone ? ?Medications: ?Prior to Admission medications   ?Medication Sig Start Date End Date Taking? Authorizing Provider  ?allopurinol (ZYLOPRIM) 300 MG tablet Take 150 mg by mouth daily. 12/04/21  Yes [provider]  ?aspirin EC 81 MG tablet Take 81 mg by mouth once a week. Swallow whole.   Yes [provider]  ?lisinopril (ZESTRIL) 10 MG tablet Take 10 mg by mouth daily. 12/04/21  Yes [provider]  ?traZODone (DESYREL) 100 MG tablet Take 100 mg by mouth at bedtime as needed for sleep. 12/03/21  Yes [provider]  ?  ? ?History reviewed. No pertinent family history. ? ?Social History  ? ?Socioeconomic History  ? Marital status: Married  ?  Spouse name: Not on file  ? Number of children: Not on file  ? Years of education: Not on file  ? Highest education level: Not on file  ?Occupational History  ? Not on file  ?Tobacco Use  ? Smoking status: Never  ? Smokeless tobacco: Never  ?Vaping Use  ? Vaping Use: Never used  ?Substance and Sexual Activity  ? Alcohol use: Never  ? Drug use: Never  ? Sexual activity: Not on file  ?Other Topics Concern  ? Not on file  ?Social History Narrative  ? Not on file  ? ?Social Determinants of Health  ? ?Financial Resource Strain: Not on file  ?Food Insecurity: Not on file  ?Transportation Needs: Not on file  ?Physical Activity: Not on file  ?Stress: Not on file  ?Social Connections: Not on file  ? ? ? ?Review of Systems: A 12 point ROS discussed and pertinent positives are indicated in the HPI above.  All other systems are negative. ? ?Review of Systems ? ?Vital Signs: ?BP (!) 154/65   Pulse 65   Temp 97.8 ?F (36.6 ?C) (Oral)   Resp 17   Ht 5' 10.5" (1.791 m)   Wt 107  kg   SpO2 94%   BMI 33.38 kg/m?  ? ?Physical Exam ?General: 76 yo male appearing stated age.  Well-developed, well-nourished.  No distress. ?HEENT: Atraumatic, normocephalic.  Conjugate gaze, extra-ocular motor intact. No scleral icterus or scleral injection. No lesions on external ears, nose, lips, or gums.  Oral mucosa moist, pink.  ?Neck: Symmetric with no goiter enlargement.  ?Chest/Lungs:  Symmetric chest with inspiration/expiration.  No labored breathing.  Clear to auscultation with no wheezes, rhonchi, or rales.  ?Heart:  RRR, with no third heart sounds appreciated. No JVD appreciated.  ?Abdomen:  Soft, obese, NT/ND, with + bowel sounds.   ?Genito-urinary: Deferred ?Neurologic: Alert & Oriented to person, place, and time.   Normal affect and insight.  Appropriate questions.  Moving all 4 extremities with  gross sensory intact.  ?Pulse Exam:  palpable right and left radial pulse. Palpable right popliteal artery & left popliteal artery.   ?Extremities: No wound.  Mild telangiectasias.  No pitting edema. No asymmetric warmth or swelling.  ? ?Imaging: ?DG Chest 2 View ? ?Result Date: 12/24/2021 ?CLINICAL DATA:  Shortness of breath.  Near-syncope. EXAM: CHEST - 2 VIEW COMPARISON:  Chest two views 11/21/2010 FINDINGS: Cardiac silhouette and mediastinal contours are within normal limits. The lungs are clear. Resolution of the prior bilateral costophrenic angle mild heterogeneous airspace opacities on prior remote 2012 radiographs. No pleural effusion or pneumothorax. Mild dextrocurvature of the thoracic spine. Mild multilevel degenerative disc changes of the thoracic spine. Severe left acromioclavicular joint space narrowing, unchanged. IMPRESSION: No active cardiopulmonary disease. Electronically Signed   By: Yvonne Kendall M.D.   On: 12/24/2021 12:57  ? ?CT HEAD W & WO CONTRAST (5MM) ? 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 RADIATION DOSE REDUCTION: This exam was performed according to the departmental dose-optimization program which includes automated exposure control, adjustment of the mA and/or kV according to patient size and/or use of iterative reconstruction technique. CONTRAST:  76m OMNIPAQUE IOHEXOL 300 MG/ML  SOLN COMPARISON:  None Available. FINDINGS: Brain: There is no mass, hemorrhage or extra-axial collection. The size and configuration of the ventricles and extra-axial CSF spaces are normal. The brain parenchyma is normal, without acute or chronic infarction. No abnormal enhancement. Vascular: No abnormal hyperdensity of the major intracranial arteries or dural venous sinuses. No intracranial atherosclerosis.  Skull: The visualized skull base, calvarium and extracranial soft tissues are normal. Sinuses/Orbits: No fluid levels or advanced mucosal thickening of the visualized paranasal sinuses. No mastoid or middle ear effusion. The orbits are normal. IMPRESSION: Normal head CT. Electronically Signed   By: KUlyses JarredM.D.   On: 12/24/2021 20:12  ? ?CT Angio Chest/Abd/Pel for Dissection W and/or Wo Contrast ? ?Result Date: 12/24/2021 ?CLINICAL DATA:  Acute chest pain radiating to lower abdomen since yesterday, short of breath, near syncope EXAM: CT ANGIOGRAPHY CHEST, ABDOMEN AND PELVIS TECHNIQUE: Non-contrast CT of the chest was initially obtained. Multidetector CT imaging through the chest, abdomen and pelvis was performed using the standard protocol during bolus administration of intravenous contrast. Multiplanar reconstructed images and MIPs were obtained and reviewed to evaluate the vascular anatomy. RADIATION DOSE REDUCTION: This exam was performed according to the departmental dose-optimization program which includes automated exposure control, adjustment of the mA and/or kV according to patient size and/or use of iterative reconstruction technique. CONTRAST:  1089mOMNIPAQUE  IOHEXOL 350 MG/ML SOLN COMPARISON:  12/17/2007 FINDINGS: CTA CHEST FINDINGS Cardiovascular: The thoracic aorta is unremarkable without aneurysm or dissection. There is adequate opacification of the pulmonary vasculature, with large saddle pulmonary embolus identified. There is significant clot burden, with a dilated RV/LV ratio measuring approximately 1.17 consistent with right heart strain. No pericardial effusion.  No significant atherosclerosis. Mediastinum/Nodes: No enlarged mediastinal, hilar, or axillary lymph nodes. Thyroid gland, trachea, and esophagus demonstrate no significant findings. Lungs/Pleura: No acute airspace disease, effusion, or pneumothorax. Central airways are widely patent. Musculoskeletal: No acute or destructive bony  lesions. Reconstructed images demonstrate no additional findings. Review of the MIP images confirms the above findings. CTA ABDOMEN AND PELVIS FINDINGS VASCULAR Aorta: Normal caliber aorta without aneurysm, dissection

## 2021-12-24 NOTE — Sedation Documentation (Signed)
PAP: 41/14 (24) ?

## 2021-12-24 NOTE — Progress Notes (Incomplete)
Coordination of care note - received c

## 2021-12-24 NOTE — ED Provider Notes (Signed)
?[facility] DEPT ?Provider Note ? ? ?CSN: [id number] ?Arrival date & time: 12/24/21  1159 ? ?  ? ?History ? ?Chief Complaint  ?Patient presents with  ? Shortness of Breath  ? ? ?Stanley Juarez is a 76 y.o. male presenting from home with acute shortness of breath and abdominal pain.  The patient reports that he was "hauling wood yesterday" and that he tossed a heavy log, and immediately afterwards began to feel short of breath.  He reports he was having discomfort in his lower chest that radiates towards his lower abdomen below the umbilicus, like a dull throb.  He had never had this feeling before.  The pain is gone away but he continued to feel short of breath all last night, difficulty sleeping, even with his CPAP in bed at night. ? ?He denies any history of MI or coronary disease or significant family history of CAD/MI (under 84).  He denies history of smoking.  He reports his blood pressure usually well controlled.  He has borderline high cholesterol.  He is not diabetic. ? ?He denies family history of aortic aneurysm. ? ?He denies any personal or familial history of DVT or PE or blood clot disorder. ? ?HPI ? ?  ? ?Home Medications ?Prior to Admission medications   ?Medication Sig Start Date End Date Taking? Authorizing Provider  ?allopurinol (ZYLOPRIM) 300 MG tablet Take 150 mg by mouth daily. 12/04/21  Yes [provider]  ?aspirin EC 81 MG tablet Take 81 mg by mouth once a week. Swallow whole.   Yes [provider]  ?lisinopril (ZESTRIL) 10 MG tablet Take 10 mg by mouth daily. 12/04/21  Yes [provider]  ?traZODone (DESYREL) 100 MG tablet Take 100 mg by mouth at bedtime as needed for sleep. 12/03/21  Yes [provider]  ?   ? ?Allergies    ?Penicillins and Prednisone   ? ?Review of Systems   ?Review of Systems ? ?Physical Exam ?Updated Vital Signs ?BP 135/74   Pulse 73   Temp 97.8 ?F (36.6 ?C) (Oral)   Resp 18   Ht 5' 10.5" (1.791 m)   Wt  107 kg   SpO2 94%   BMI 33.38 kg/m?  ?Physical Exam ?Constitutional:   ?   General: He is not in acute distress. ?HENT:  ?   Head: Normocephalic and atraumatic.  ?Eyes:  ?   Conjunctiva/sclera: Conjunctivae normal.  ?   Pupils: Pupils are equal, round, and reactive to light.  ?Cardiovascular:  ?   Rate and Rhythm: Normal rate and regular rhythm.  ?Pulmonary:  ?   Effort: Pulmonary effort is normal. No respiratory distress.  ?Abdominal:  ?   General: There is no distension.  ?   Tenderness: There is no abdominal tenderness.  ?Skin: ?   General: Skin is warm and dry.  ?Neurological:  ?   General: No focal deficit present.  ?   Mental Status: He is alert. Mental status is at baseline.  ?Psychiatric:     ?   Mood and Affect: Mood normal.     ?   Behavior: Behavior normal.  ? ? ?ED Results / Procedures / Treatments   ?Labs ?(all labs ordered are listed, but only abnormal results are displayed) ?Labs Reviewed  ?COMPREHENSIVE METABOLIC PANEL - Abnormal; Notable for the following components:  ?    Result Value  ? Glucose, Bld 113 (*)   ? Creatinine, Ser 1.33 (*)   ? Total Bilirubin  1.4 (*)   ? GFR, Estimated 56 (*)   ? All other components within normal limits  ?BRAIN NATRIURETIC PEPTIDE - Abnormal; Notable for the following components:  ? B Natriuretic Peptide 350.7 (*)   ? All other components within normal limits  ?TROPONIN I (HIGH SENSITIVITY) - Abnormal; Notable for the following components:  ? Troponin I (High Sensitivity) 788 (*)   ? All other components within normal limits  ?TROPONIN I (HIGH SENSITIVITY) - Abnormal; Notable for the following components:  ? Troponin I (High Sensitivity) 670 (*)   ? All other components within normal limits  ?RESP PANEL BY RT-PCR (FLU A&B, COVID) ARPGX2  ?CBC  ?APTT  ?PROTIME-INR  ?HEPARIN LEVEL (UNFRACTIONATED)  ?CBC  ?HEPARIN LEVEL (UNFRACTIONATED)  ?HEPARIN LEVEL (UNFRACTIONATED)  ?HEPARIN LEVEL (UNFRACTIONATED)  ?HEPARIN LEVEL (UNFRACTIONATED)  ?CBC  ?CBC  ?CBC  ?CBC   ?FIBRINOGEN  ?FIBRINOGEN  ?FIBRINOGEN  ?FIBRINOGEN  ? ? ?EKG ?EKG Interpretation ? ?Date/Time:  Tuesday Dec 24 2021 12:06:59 EDT ?Ventricular Rate:  82 ?PR Interval:  203 ?QRS Duration: 89 ?QT Interval:  352 ?QTC Calculation: 412 ?R Axis:   32 ?Text Interpretation: Sinus rhythm Multiple ventricular premature complexes No previous ECGs available Confirmed by Gareth Morgan [phone]) on 12/24/2021 4:03:59 PM ? ?Radiology ?DG Chest 2 View ? ?Result Date: 12/24/2021 ?CLINICAL DATA:  Shortness of breath.  Near-syncope. EXAM: CHEST - 2 VIEW COMPARISON:  Chest two views 11/21/2010 FINDINGS: Cardiac silhouette and mediastinal contours are within normal limits. The lungs are clear. Resolution of the prior bilateral costophrenic angle mild heterogeneous airspace opacities on prior remote 2012 radiographs. No pleural effusion or pneumothorax. Mild dextrocurvature of the thoracic spine. Mild multilevel degenerative disc changes of the thoracic spine. Severe left acromioclavicular joint space narrowing, unchanged. IMPRESSION: No active cardiopulmonary disease. Electronically Signed   By: Yvonne Kendall M.D.   On: 12/24/2021 12:57  ? ?CT HEAD W & WO CONTRAST (5MM) ? 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 RADIATION DOSE REDUCTION: This exam was performed according to the departmental dose-optimization program which includes automated exposure control, adjustment of the mA and/or kV according to patient size and/or use of iterative reconstruction technique. CONTRAST:  29m OMNIPAQUE IOHEXOL 300 MG/ML  SOLN COMPARISON:  None Available. FINDINGS: Brain: There is no mass, hemorrhage or extra-axial collection. The size and configuration of the ventricles and extra-axial CSF spaces are normal. The brain parenchyma is normal, without acute or  chronic infarction. No abnormal enhancement. Vascular: No abnormal hyperdensity of the major intracranial arteries or dural venous sinuses. No intracranial atherosclerosis. Skull: The visualized skull base, calvarium and extracranial soft tissues are normal. Sinuses/Orbits: No fluid levels or advanced mucosal thickening of the visualized paranasal sinuses. No mastoid or middle ear effusion. The orbits are normal. IMPRESSION: Normal head CT. Electronically Signed   By: KUlyses JarredM.D.   On: 12/24/2021 20:12  ? ?CT Angio Chest/Abd/Pel for Dissection W and/or Wo Contrast ? ?Result Date: 12/24/2021 ?CLINICAL DATA:  Acute chest pain radiating to lower abdomen since yesterday, short of breath, near syncope EXAM: CT ANGIOGRAPHY CHEST, ABDOMEN AND PELVIS TECHNIQUE: Non-contrast CT of the chest was initially obtained. Multidetector CT imaging through the chest, abdomen and pelvis was performed using the standard protocol during bolus administration of intravenous contrast. Multiplanar reconstructed images and MIPs were obtained and reviewed to evaluate  the vascular anatomy. RADIATION DOSE REDUCTION: This exam was performed according to the departmental dose-optimization program which includes automated exposure control, adjustment of the mA and/or kV according to patient size and/or use of iterative reconstruction technique. CONTRAST:  141m OMNIPAQUE IOHEXOL 350 MG/ML SOLN COMPARISON:  12/17/2007 FINDINGS: CTA CHEST FINDINGS Cardiovascular: The thoracic aorta is unremarkable without aneurysm or dissection. There is adequate opacification of the pulmonary vasculature, with large saddle pulmonary embolus identified. There is significant clot burden, with a dilated RV/LV ratio measuring approximately 1.17 consistent with right heart strain. No pericardial effusion.  No significant atherosclerosis. Mediastinum/Nodes: No enlarged mediastinal, hilar, or axillary lymph nodes. Thyroid gland, trachea, and esophagus demonstrate no  significant findings. Lungs/Pleura: No acute airspace disease, effusion, or pneumothorax. Central airways are widely patent. Musculoskeletal: No acute or destructive bony lesions. Reconstructed images demonstrate no add

## 2021-12-24 NOTE — H&P (Signed)
? ?NAME:  Stanley Juarez, MRN:  [id number], DOB:  1946-01-30, LOS: 0 ?ADMISSION DATE:  12/24/2021, CONSULTATION DATE:  12/24/21  ?REFERRING MD:  Langston Masker, CHIEF COMPLAINT:  sob   ? ?History of Present Illness:  ?76 yo man with hx HTN, OSA, gout, here with  ?SOB since 12/23/21 pm.   ?Dizzines ?Was moving wood at the time.  Discomfort in lower chest radiating towards abd.   ? ?Bradycardia to 40s intermittently in the ED, normal bp  ?O2 93-97 on RA  ? ?Started on Heparin in ED  ? ?IR consulted, plan for cath directed TPA given size of PE, R H strain, elevated trop.  ? 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   ?Pertinent  Medical History  ? ?OSA on CPAP ?HTN  ?Gout ?Intervertebral disc degeneration, lumbar ?Insomnia ?ED ? ? ?Significant Hospital Events: ?Including procedures, antibiotic start and stop dates in addition to other pertinent events   ? ? ?Interim History / Subjective:  ? ? ?Objective   ?Blood pressure (!) 154/65, pulse 65, temperature 97.8 ?F (36.6 ?C), temperature source Oral, resp. rate 17, height 5' 10.5" (1.791 m), weight 107 kg, SpO2 94 %. ?   ?   ?No intake or output data in the 24 hours ending 12/24/21 2206 ?Filed Weights  ? 12/24/21 1236  ?Weight: 107 kg  ? ? ?Examination: ?General: NAD pleasant   ?HENT: NCAT  ?Lungs: CTAB ?Cardiovascular: RRR no mgr  ?Abdomen: nt, nd, nbs  ?Extremities: no edema no erythema  ?Neuro: a and o x 3 ?GU:  ? ?Resolved Hospital Problem list   ? ?Assessment & Plan:  ?76 yo man with  ?Acute PE ?PESI score 114, class 4, high risk.   ?Started on heparin.  ?Hemodynamically stable, though had episodes of bradycardia recorded in ED.  ?Now on 2L .  ?Plan for catheter directed TPA.   ?INR/PT pending.  ?CT head negative, no contraindications to TPA>  ? ?2. HTN: hold oral meds for now.  ? ?3. Gout ? ?4. OSA - CPAP at night.  ? ?5. Renal Masses: will need urology consult when stable for work up.   ?  ? ?Best Practice (right click and "Reselect all SmartList Selections" daily)   ? ?Diet/type: clear liquids ?DVT prophylaxis: other ?GI prophylaxis: PPI ?Lines: N/A ?Foley:  N/A ?Code Status:  full code ?Last date of multidisciplinary goals of care discussion '[]'$  ? ?Labs   ?CBC: ?Recent Labs  ?Lab 12/24/21 ?1325  ?WBC 8.7  ?HGB 16.6  ?HCT 49.9  ?MCV 92.6  ?PLT 177  ? ? ?Basic Metabolic Panel: ?Recent Labs  ?Lab 12/24/21 ?1325  ?NA 138  ?K 5.1  ?CL 107  ?CO2 26  ?GLUCOSE 113*  ?BUN 20  ?CREATININE 1.33*  ?CALCIUM 9.2  ? ?GFR: ?Estimated Creatinine Clearance: 60.2 mL/min (A) (by C-G formula based on SCr of 1.33 mg/dL (H)). ?Recent Labs  ?Lab 12/24/21 ?1325  ?WBC 8.7  ? ? ?Liver Function Tests: ?Recent Labs  ?Lab 12/24/21 ?1325  ?AST 31  ?ALT 29  ?ALKPHOS 65  ?BILITOT 1.4*  ?PROT 7.5  ?ALBUMIN 4.0  ? ?No results for input(s): LIPASE, AMYLASE in the last 168 hours. ?No results for input(s): AMMONIA in the last 168 hours. ? ?ABG ?No results found for: PHART, PCO2ART, PO2ART, HCO3, TCO2, ACIDBASEDEF, O2SAT  ? ?Coagulation Profile: ?No results for input(s): INR, PROTIME in the last 168 hours. ? ?Cardiac Enzymes: ?No results for input(s): CKTOTAL, CKMB, CKMBINDEX, TROPONINI in the last 168  hours. ? ?HbA1C: ?No results found for: HGBA1C ? ?CBG: ?No results for input(s): GLUCAP in the last 168 hours. ? ?Review of Systems:   ?Review of Systems  ?Constitutional:  Negative for fever.  ?HENT:  Negative for hearing loss.   ?Eyes:  Negative for blurred vision.  ?Respiratory:  Positive for shortness of breath. Negative for cough and hemoptysis.   ?Cardiovascular:  Positive for chest pain.  ?Gastrointestinal:  Negative for heartburn.  ?Genitourinary:  Negative for dysuria.  ?Musculoskeletal:  Negative for myalgias.  ?Skin:  Negative for rash.  ?Neurological:  Positive for dizziness.  ?Endo/Heme/Allergies:  Does not bruise/bleed easily.  ? ? ?Past Medical History:  ?He,  has a past medical history of Cancer ([place]) and Sleep apnea.  ? ?Surgical History:  ? ?Past Surgical History:  ?Procedure Laterality Date  ? skin  cancer removal    ?  ? ?Social History:  ? reports that he has never smoked. He has never used smokeless tobacco. He reports that he does not drink alcohol and does not use drugs.  ? ?Family History:  ?His family history is not on file.  ? ?Allergies ?Allergies  ?Allergen Reactions  ? Penicillins   ?  Did it involve swelling of the face/tongue/throat, SOB, or low BP? Yes ?Did it involve sudden or severe rash/hives, skin peeling, or any reaction on the inside of your mouth or nose? Yes ?Did you need to seek medical attention at a hospital or doctor's office? No ?When did it last happen?    childhood   ?If all above answers are "NO", may proceed with cephalosporin use. ? ?  ? Prednisone   ?  Constipation  ?  ? ?Home Medications  ?Prior to Admission medications   ?Medication Sig Start Date End Date Taking? Authorizing Provider  ?allopurinol (ZYLOPRIM) 300 MG tablet Take 150 mg by mouth daily. 12/04/21  Yes [provider]  ?aspirin EC 81 MG tablet Take 81 mg by mouth once a week. Swallow whole.   Yes [provider]  ?lisinopril (ZESTRIL) 10 MG tablet Take 10 mg by mouth daily. 12/04/21  Yes [provider]  ?traZODone (DESYREL) 100 MG tablet Take 100 mg by mouth at bedtime as needed for sleep. 12/03/21  Yes [provider]  ?  ? ?Critical care time: 45 min ?  ? ? ? ? ? ?

## 2021-12-24 NOTE — ED Provider Triage Note (Signed)
Emergency Medicine Provider Triage Evaluation Note ? ?Stanley Juarez , a 76 y.o. male  was evaluated in triage.  Pt complains of shortness of breath and near syncope.  Yesterday patient was chopping wood.  He picked up a heavy piece of wood and threw it and felt like he was going to pass out.  He felt extremely short of breath and had to sit down.  He states that this lasted for 10 or 15 minutes.  He came in today because overnight he had severe difficulty breathing despite using his CPAP.  He used 2 pillows which helped increase his ability to breathe however he was still unable to sleep.  He noted bilateral pedal edema over the last couple of weeks.  He denies any weight gain. ? ?Review of Systems  ?Positive: Shortness of breath ?Negative: Loss of consciousness ? ?Physical Exam  ?BP (!) 144/84 (BP Location: Right Arm)   Pulse 84   Temp 97.6 ?F (36.4 ?C) (Oral)   Resp 18   Ht 5' 10.5" (1.791 m)   Wt 107 kg   SpO2 94%   BMI 33.38 kg/m?  ?Gen:   Awake, no distress   ?Resp:  Normal effort  ?MSK:   Moves extremities without difficulty  ?Other:  1+ pedal edema bilaterally ? ?Medical Decision Making  ?Medically screening exam initiated at 12:41 PM.  Appropriate orders placed.  CHIGOZIE BASALDUA was informed that the remainder of the evaluation will be completed by another provider, this initial triage assessment does not replace that evaluation, and the importance of remaining in the ED until their evaluation is complete. ? ?Appropriate orders placed ?  ?Margarita Mail, PA-C ?12/24/21 1245 ? ?

## 2021-12-24 NOTE — Procedures (Signed)
Interventional Radiology Procedure Note ? ?Procedure:  ?US guided access R CFV x 2 ?Placement of bilateral PA infusion catheter ?Measure PA pressure ? ?Findings: ?PA pressure: 41/14 (24) ? ?Complications: None ? ?Recommendations:  ?- Stable to ICU ?- Each catheter will receive '1mg'$  tPA per hour x 12 hrs, for total of 24 mg in 12 hours ?- Each sheath to receive 20cc NS per hour for [facility] ?- Right hip straight overnight ?- Q6 hr H&H ?- Q6 hr fibrinogen ?- Q 6hr heparin level, with hep ggt managed by pharmacy thank you ?- If HA or change in mental status, then page VIR on call ?- If fibrinogen < 150, page VIR on call ?  ? ?Signed, ? ?Dulcy Fanny. Earleen Newport, DO ? ? ?

## 2021-12-24 NOTE — ED Notes (Signed)
Trop 788.  ?

## 2021-12-24 NOTE — ED Notes (Signed)
Patient transported to CT 

## 2021-12-25 ENCOUNTER — Inpatient Hospital Stay (HOSPITAL_COMMUNITY): Payer: Medicare HMO

## 2021-12-25 DIAGNOSIS — I2602 Saddle embolus of pulmonary artery with acute cor pulmonale: Secondary | ICD-10-CM | POA: Diagnosis not present

## 2021-12-25 HISTORY — PX: IR INFUSION THROMBOL ARTERIAL INITIAL (MS): IMG5376

## 2021-12-25 HISTORY — PX: IR ANGIOGRAM PULMONARY BILATERAL SELECTIVE: IMG664

## 2021-12-25 HISTORY — PX: IR US GUIDE VASC ACCESS RIGHT: IMG2390

## 2021-12-25 HISTORY — PX: IR THROMB F/U EVAL ART/VEN FINAL DAY (MS): IMG5379

## 2021-12-25 LAB — FIBRINOGEN
Fibrinogen: 387 mg/dL (ref 210–475)
Fibrinogen: 341 mg/dL (ref 210–475)
Fibrinogen: 329 mg/dL (ref 210–475)
Fibrinogen: 413 mg/dL (ref 210–475)

## 2021-12-25 LAB — MAGNESIUM: Magnesium: 2 mg/dL (ref 1.7–2.4)

## 2021-12-25 LAB — HEPARIN LEVEL (UNFRACTIONATED)
Heparin Unfractionated: 0.32 IU/mL (ref 0.30–0.70)
Heparin Unfractionated: 0.35 IU/mL (ref 0.30–0.70)
Heparin Unfractionated: 0.3 IU/mL (ref 0.30–0.70)
Heparin Unfractionated: 0.3 IU/mL (ref 0.30–0.70)

## 2021-12-25 LAB — CBC
HCT: 46.5 % (ref 39.0–52.0)
HCT: 47.5 % (ref 39.0–52.0)
HCT: 46.3 % (ref 39.0–52.0)
HCT: 47.3 % (ref 39.0–52.0)
HCT: 48.9 % (ref 39.0–52.0)
Hemoglobin: 15.4 g/dL (ref 13.0–17.0)
Hemoglobin: 15.5 g/dL (ref 13.0–17.0)
Hemoglobin: 15.3 g/dL (ref 13.0–17.0)
Hemoglobin: 15.6 g/dL (ref 13.0–17.0)
Hemoglobin: 16.2 g/dL (ref 13.0–17.0)
MCH: 30.6 pg (ref 26.0–34.0)
MCH: 30.9 pg (ref 26.0–34.0)
MCH: 30.8 pg (ref 26.0–34.0)
MCH: 30.5 pg (ref 26.0–34.0)
MCH: 31 pg (ref 26.0–34.0)
MCHC: 32.6 g/dL (ref 30.0–36.0)
MCHC: 33.1 g/dL (ref 30.0–36.0)
MCHC: 33 g/dL (ref 30.0–36.0)
MCHC: 33 g/dL (ref 30.0–36.0)
MCHC: 33.1 g/dL (ref 30.0–36.0)
MCV: 93.2 fL (ref 80.0–100.0)
MCV: 93.7 fL (ref 80.0–100.0)
MCV: 93.2 fL (ref 80.0–100.0)
MCV: 92.4 fL (ref 80.0–100.0)
MCV: 93.5 fL (ref 80.0–100.0)
Platelets: 145 10*3/uL — ABNORMAL LOW (ref 150–400)
Platelets: 149 10*3/uL — ABNORMAL LOW (ref 150–400)
Platelets: 144 10*3/uL — ABNORMAL LOW (ref 150–400)
Platelets: 132 10*3/uL — ABNORMAL LOW (ref 150–400)
Platelets: 134 10*3/uL — ABNORMAL LOW (ref 150–400)
RBC: 4.99 MIL/uL (ref 4.22–5.81)
RBC: 5.07 MIL/uL (ref 4.22–5.81)
RBC: 4.97 MIL/uL (ref 4.22–5.81)
RBC: 5.12 MIL/uL (ref 4.22–5.81)
RBC: 5.23 MIL/uL (ref 4.22–5.81)
RDW: 13.4 % (ref 11.5–15.5)
RDW: 13.5 % (ref 11.5–15.5)
RDW: 13.3 % (ref 11.5–15.5)
RDW: 13.2 % (ref 11.5–15.5)
RDW: 13.2 % (ref 11.5–15.5)
WBC: 8.5 10*3/uL (ref 4.0–10.5)
WBC: 8.7 10*3/uL (ref 4.0–10.5)
WBC: 8.3 10*3/uL (ref 4.0–10.5)
WBC: 8.4 10*3/uL (ref 4.0–10.5)
WBC: 7.4 10*3/uL (ref 4.0–10.5)
nRBC: 0 % (ref 0.0–0.2)
nRBC: 0 % (ref 0.0–0.2)
nRBC: 0 % (ref 0.0–0.2)
nRBC: 0 % (ref 0.0–0.2)
nRBC: 0 % (ref 0.0–0.2)

## 2021-12-25 LAB — POTASSIUM: Potassium: 4.4 mmol/L (ref 3.5–5.1)

## 2021-12-25 LAB — RESP PANEL BY RT-PCR (FLU A&B, COVID) ARPGX2
Influenza A by PCR: NEGATIVE
Influenza B by PCR: NEGATIVE
SARS Coronavirus 2 by RT PCR: NEGATIVE

## 2021-12-25 LAB — APTT: aPTT: 90 seconds — ABNORMAL HIGH (ref 24–36)

## 2021-12-25 LAB — PROTIME-INR
INR: 1.2 (ref 0.8–1.2)
Prothrombin Time: 14.6 seconds (ref 11.4–15.2)

## 2021-12-25 LAB — MRSA NEXT GEN BY PCR, NASAL: MRSA by PCR Next Gen: NOT DETECTED

## 2021-12-25 MED ORDER — AMLODIPINE BESYLATE 5 MG PO TABS
5.0000 mg | ORAL_TABLET | Freq: Every day | ORAL | Status: DC
  Administered 2021-12-25 – 2021-12-28 (×4): 5 mg via ORAL
  Filled 2021-12-25 (×4): qty 1

## 2021-12-25 MED ORDER — CHLORHEXIDINE GLUCONATE CLOTH 2 % EX PADS
6.0000 | MEDICATED_PAD | Freq: Every day | CUTANEOUS | Status: DC
  Administered 2021-12-25 – 2021-12-28 (×4): 6 via TOPICAL

## 2021-12-25 NOTE — Progress Notes (Signed)
This nurse assessed the patient's right femoral bandage and observed that the dressing was had a pink-tinged leakage contained within the transparent dressing. IR was notified and a NP from IR came to see the patient at the bedside to assess. The bandage was still intact and there was no further leaking. This nurse will continue with current plan of care until the removal of this access by IR.  ?

## 2021-12-25 NOTE — Progress Notes (Addendum)
ANTICOAGULATION CONSULT NOTE - follow up ? ?Pharmacy Consult for IV heparin ?Indication: pulmonary embolus ? ?Allergies  ?Allergen Reactions  ? Penicillins   ?  Did it involve swelling of the face/tongue/throat, SOB, or low BP? Yes ?Did it involve sudden or severe rash/hives, skin peeling, or any reaction on the inside of your mouth or nose? Yes ?Did you need to seek medical attention at a hospital or doctor's office? No ?When did it last happen?    childhood   ?If all above answers are "NO", may proceed with cephalosporin use. ? ?  ? Prednisone   ?  Constipation  ? ? ?Patient Measurements: ?Height: '5\' 10"'$  (177.8 cm) ?Weight: 107.9 kg (237 lb 14 oz) ?IBW/kg (Calculated) : 73 ?Heparin Dosing Weight: 97 kg ? ?Vital Signs: ?Temp: 97.9 ?F (36.6 ?C) (05/03 3893) ?Temp Source: Oral (05/03 7342) ?BP: 159/97 (05/03 0700) ?Pulse Rate: 68 (05/03 0700) ? ?Labs: ?Recent Labs  ?  12/24/21 ?1325 12/24/21 ?1443 12/25/21 ?0256 12/25/21 ?0606  ?HGB 16.6  --  15.4  15.5 15.3  ?HCT 49.9  --  46.5  47.5 46.3  ?PLT 177  --  149*  145* 144*  ?APTT  --   --  90*  --   ?LABPROT  --   --  14.6  --   ?INR  --   --  1.2  --   ?HEPARINUNFRC  --   --  0.32 0.35  ?CREATININE 1.33*  --   --   --   ?TROPONINIHS 788* 670*  --   --   ? ? ? ?Estimated Creatinine Clearance: 60 mL/min (A) (by C-G formula based on SCr of 1.33 mg/dL (H)). ? ? ?Medical History: ?Past Medical History:  ?Diagnosis Date  ? Cancer [facility])   ? Sleep apnea   ? ? ?Assessment: ?63 y/oM who presented to [facility] ED with acute shortness of breath and abdominal pain. High sensitivity troponin 788 > 670. CTa + for large saddle PE with evidence of right heart strain. Pharmacy consulted for IV heparin dosing for PE. Patient not on anticoagulants PTA. CBC WNL.  ? ?Significant Events ?- 5/3 @ 0005 --> catheter directed tPA instilled in IR ? ?12/25/2021 ?- 6 hour heparin level therapeutic at 0.35 this AM on heparin 1600 units/hr ?- Hgb 15.4, plts down to 144 ?- Fibrinogen 341 ?- No signs of  bleeding and no issues with heparin infusion per RN ? ?Goal of Therapy:  ?Heparin level 0.3-0.7 units/ml ?Monitor platelets by anticoagulation protocol: Yes ?  ?Plan:  ?Continue heparin infusion at 1600 units/hr ?Monitor heparin level, fibrinogen, and CBC every 6 hours x4 while catheter directed tPA is instilled per IR recs ?Notify MD for fibrinogen < 150 ?Can consider changing to daily HL and CBC monitoring after tPA completion ?Monitor closely for s/sx of bleeding ? ?Dimple Nanas, PharmD ?12/25/2021 8:17 AM ? ?PM UPDATE: ?- Heparin level therapeutic at 0.30, fibrinogen 329 ?- Lytic therapy completed, both R femoral sheaths removed around ~1245. No bleeding noted per RN ?- Continue heparin 1600 units/hr ?- F/u heparin level in 6 hours ? ?Dimple Nanas, PharmD ?12/25/2021 1:32 PM ? ?

## 2021-12-25 NOTE — Progress Notes (Signed)
ANTICOAGULATION CONSULT NOTE - follow up ? ?Pharmacy Consult for IV heparin ?Indication: pulmonary embolus ? ?Allergies  ?Allergen Reactions  ? Penicillins   ?  Did it involve swelling of the face/tongue/throat, SOB, or low BP? Yes ?Did it involve sudden or severe rash/hives, skin peeling, or any reaction on the inside of your mouth or nose? Yes ?Did you need to seek medical attention at a hospital or doctor's office? No ?When did it last happen?    childhood   ?If all above answers are "NO", may proceed with cephalosporin use. ? ?  ? Prednisone   ?  Constipation  ? ? ?Patient Measurements: ?Height: '5\' 10"'$  (177.8 cm) ?Weight: 107.9 kg (237 lb 14 oz) ?IBW/kg (Calculated) : 73 ?Heparin Dosing Weight: 97 kg ? ?Vital Signs: ?Temp: 97.6 ?F (36.4 ?C) (05/03 0100) ?Temp Source: Oral (05/03 0100) ?BP: 161/70 (05/03 0300) ?Pulse Rate: 84 (05/03 0300) ? ?Labs: ?Recent Labs  ?  12/24/21 ?1325 12/24/21 ?1443 12/25/21 ?0256  ?HGB 16.6  --  15.4  15.5  ?HCT 49.9  --  46.5  47.5  ?PLT 177  --  149*  145*  ?HEPARINUNFRC  --   --  0.32  ?CREATININE 1.33*  --   --   ?TROPONINIHS 788* 670*  --   ? ? ? ?Estimated Creatinine Clearance: 60 mL/min (A) (by C-G formula based on SCr of 1.33 mg/dL (H)). ? ? ?Medical History: ?Past Medical History:  ?Diagnosis Date  ? Cancer [facility])   ? Sleep apnea   ? ? ?Assessment: ?12 y/oM who presented to [facility] ED with acute shortness of breath and abdominal pain. High sensitivity troponin 788 > 670. CTa + for large saddle PE with evidence of right heart strain. Pharmacy consulted for IV heparin dosing for PE. Patient not on anticoagulants PTA. CBC WNL.  ? ?PT also received cath directed TPA given size of PE ? ?12/25/2021 ?HL 0.32 therapeutic on 1600 units/hr ?Hgb 15.4, plts down to 149 ?Per RN no bleeding ? ?Goal of Therapy:  ?Heparin level 0.3-0.7 units/ml ?Monitor platelets by anticoagulation protocol: Yes ?  ?Plan:  ?continue heparin infusion at 1600 units/hr ?Heparin level in 8 hours  ?Daily CBC, heparin  level ?Monitor closely for s/sx of bleeding ? ?Dolly Rias RPh ?12/25/2021, 3:48 AM\ ? ? ? ? ?

## 2021-12-25 NOTE — Sedation Documentation (Signed)
As per Dr. Earleen Newport, NS infusing at 42m/hr to the side port of the each sheaths (right and left side), access point right femoral ?

## 2021-12-25 NOTE — Progress Notes (Signed)
eLink Physician-Brief Progress Note ?Patient Name: Stanley Juarez ?DOB: Aug 29, 1945 ?MRN: [id number] ? ? ?Date of Service ? 12/25/2021  ?HPI/Events of Note ? Patient admitted with saddle PE with right heart strain, incidental finding of probable renal cell carcinoma on CT imaging, he had symptomatic bradycardia in the ED but is now hemodynamically stable.  ?eICU Interventions ? New Patient Evaluation.  ? ? ? ?  ? ?Kerry Kass Olevia Westervelt ?12/25/2021, 1:42 AM ?

## 2021-12-25 NOTE — H&P (Signed)
? ?NAME:  Stanley Juarez, MRN:  [id number], DOB:  04/13/1946, LOS: 1 ?ADMISSION DATE:  12/24/2021, CONSULTATION DATE:  12/24/21  ?REFERRING MD:  Langston Masker, CHIEF COMPLAINT:  sob   ? ?History of Present Illness:  ?76 yo man with hx HTN, OSA, gout, here with  ?SOB since 12/23/21 pm.   ?Dizzines ?Was moving wood at the time.  Discomfort in lower chest radiating towards abd.   ? ?Bradycardia to 40s intermittently in the ED, normal bp  ?O2 93-97 on RA  ? ?Started on Heparin in ED  ? ?IR consulted, plan for cath directed TPA given size of PE, R H strain, elevated trop.  ? 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   ?Pertinent  Medical History  ? ?OSA on CPAP ?HTN  ?Gout ?Intervertebral disc degeneration, lumbar ?Insomnia ?ED ? ? has a past medical history of Cancer ([place]) and Sleep apnea. ? ? has a past surgical history that includes skin cancer removal. ? ? ?Significant Hospital Events: ?Including procedures, antibiotic start and stop dates in addition to other pertinent events   ?12/24/2021 - PESI score 114, class 4, high risk.   BOVA 18, Stage 2 intermeidate risk -. EKOS at 11.50pm ? ?Interim History / Subjective:  ? ?12/25/21:  ongoing EKOS tPA x 24h. He is upset that he has to keep hip straight and lie down. Wants it out. Informed him and wife he is getting very high risk medication and infusion is for 24h . He thinks it is 12h. Says he cannnot handle the inconvenience. Wants to eat. Used BiPAP dream station last night. Did not like it ? ?BP 140sbp ->says is home baseline ?On room air ?Normal mental status ?No resp distres ?Normal heart rate ? ?Objective   ?Blood pressure (!) 159/97, pulse 68, temperature 97.9 ?F (36.6 ?C), temperature source Oral, resp. rate (!) 21, height '5\' 10"'$  (1.778 m), weight 107.9 kg, SpO2 95 %. ?   ?   ? ?Intake/Output Summary (Last 24 hours) at 12/25/2021 0757 ?Last data filed at 12/25/2021 0636 ?Gross per 24 hour  ?Intake 677.37 ml  ?Output 250 ml  ?Net 427.37 ml  ? ?Filed Weights  ? 12/24/21 1236  12/25/21 0100  ?Weight: 107 kg 107.9 kg  ? ? ?Examination: ?General Appearance:  Looks stable.  ?Head:  Normocephalic, without obvious abnormality, atraumatic ?Eyes:  PERRL - yes, conjunctiva/corneas - clear. Mallampatti class3-4     ?Ears:  Normal external ear canals, both ears ?Nose:  G tube - no ?Throat:  ETT TUBE - no , OG tube - no ?Neck:  Supple,  No enlargement/tenderness/nodules ?Lungs: Clear to auscultation bilaterally,  ?Heart:  S1 and S2 normal, no murmur, CVP - no.  Pressors - no ?Abdomen:  Soft, no masses, no organomegaly ?Genitalia / Rectal:  Not done ?Extremities:  Extremities- intact. Rt groind with sheath and EKOS ?Skin:  ntact in exposed areas .  ?Neurologic:  Sedation - none -> RASS - +1 . Moves all 4s - yes. CAM-ICU - neg . Orientation - x3+ ? ? ? ? ?Resolved Hospital Problem list   ? ?Assessment & Plan:  ?76 yo man with  ?Acute PE - submassive (CT head negative) ? - Started on heparin. -> EKOS 24h since 23.50 12/24/21 ?  ?12/25/21: BP/HR/resp/mental status stable. Off o2 ? ?Plan ? - continuje EKOS per protocol of IR (I have cautioned him and his wife to follow strict instructions and not to move and violate the integrity of catheter. Or  else he can have fatal bleed) ? ?2. HTN:  - home lisinoprol. Runs 140 sbp at home ? ?5/3 - sbp 140  ? ?Plan ? - no ace inhibitor ? - start norvasc ? ? ?3. Gout ? ?5/3 - not active ? ?Plan ? - home allopurinol ? ?4. OSA - CPAP at night.  ? ?5/3 - continue cPAP /biPPA at night ? ?5. Renal Masses: - new dx present on admit ? ?Plan ?-  will need urology consult when stable for work up.   ?  ? ?Best Practice (right click and "Reselect all SmartList Selections" daily)  ? ?Diet/type: c regular diet ?DVT prophylaxis: other ?GI prophylaxis: PPI ?Lines: N/A ?Foley:  N/A ?Code Status:  full code ?Last date of multidisciplinary goals of care discussion '[]'$  - he and wife updated at bddide ? ? ? ? ? ?[place]  ? ?The patient Stanley Juarez is critically ill with  multiple organ systems failure and requires high complexity decision making for assessment and support, frequent evaluation and titration of therapies, application of advanced monitoring technologies and extensive interpretation of multiple databases.  ? ?Critical Care Time devoted to patient care services described in this note is  35  Minutes. This time reflects time of care of this signee Dr Brand Males. This critical care time does not reflect procedure time, or teaching time or supervisory time of PA/NP/Med student/Med Resident etc but could involve care discussion time  ? ? ? ?Dr. Brand Males, M.D., F.C.C.P ?Pulmonary and Critical Care Medicine ?Medical Director - [facility] ICU ?Staff Physician, Irene ?[facility] ?Pager: [phone], If no answer or between  15:00h - 7:00h: call [phone] ? ?12/25/2021 ?7:57 AM ? ? ?LABS  ? ? ?PULMONARY ?No results for input(s): PHART, PCO2ART, PO2ART, HCO3, TCO2, O2SAT in the last 168 hours. ? ?Invalid input(s): PCO2, PO2 ? ?CBC ?Recent Labs  ?Lab 12/24/21 ?1325 12/25/21 ?0256 12/25/21 ?0606  ?HGB 16.6 15.4  15.5 15.3  ?HCT 49.9 46.5  47.5 46.3  ?WBC 8.7 8.5  8.7 8.3  ?PLT 177 149*  145* 144*  ? ? ?COAGULATION ?Recent Labs  ?Lab 12/25/21 ?0256  ?INR 1.2  ? ? ?CARDIAC ? No results for input(s): TROPONINI in the last 168 hours. ?No results for input(s): PROBNP in the last 168 hours. ? ? ?CHEMISTRY ?Recent Labs  ?Lab 12/24/21 ?1325 12/25/21 ?0256  ?NA 138  --   ?K 5.1 4.4  ?CL 107  --   ?CO2 26  --   ?GLUCOSE 113*  --   ?BUN 20  --   ?CREATININE 1.33*  --   ?CALCIUM 9.2  --   ?MG  --  2.0  ? ?Estimated Creatinine Clearance: 60 mL/min (A) (by C-G formula based on SCr of 1.33 mg/dL (H)). ? ? ?LIVER ?Recent Labs  ?Lab 12/24/21 ?1325 12/25/21 ?0256  ?AST 31  --   ?ALT 29  --   ?ALKPHOS 65  --   ?BILITOT 1.4*  --   ?PROT 7.5  --   ?ALBUMIN 4.0  --   ?INR  --  1.2  ? ? ? ?INFECTIOUS ?No results for input(s):  LATICACIDVEN, PROCALCITON in the last 168 hours. ? ? ?ENDOCRINE ?CBG (last 3)  ?No results for input(s): GLUCAP in the last 72 hours. ? ? ? ? ? ? ?IMAGING x48h  - image(s) personally visualized  -   highlighted in bold ?DG Chest 2  View ? ?Result Date: 12/24/2021 ?CLINICAL DATA:  Shortness of breath.  Near-syncope. EXAM: CHEST - 2 VIEW COMPARISON:  Chest two views 11/21/2010 FINDINGS: Cardiac silhouette and mediastinal contours are within normal limits. The lungs are clear. Resolution of the prior bilateral costophrenic angle mild heterogeneous airspace opacities on prior remote 2012 radiographs. No pleural effusion or pneumothorax. Mild dextrocurvature of the thoracic spine. Mild multilevel degenerative disc changes of the thoracic spine. Severe left acromioclavicular joint space narrowing, unchanged. IMPRESSION: No active cardiopulmonary disease. Electronically Signed   By: Yvonne Kendall M.D.   On: 12/24/2021 12:57  ? ?CT HEAD W & WO CONTRAST (5MM) ? 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 RADIATION DOSE REDUCTION: This exam was performed according to the departmental dose-optimization program which includes automated exposure control, adjustment of the mA and/or kV according to patient size and/or use of iterative reconstruction technique. CONTRAST:  57m OMNIPAQUE IOHEXOL 300 MG/ML  SOLN COMPARISON:  None Available. FINDINGS: Brain: There is no mass, hemorrhage or extra-axial collection. The size and configuration of the ventricles and extra-axial CSF spaces are normal. The brain parenchyma is normal, without acute or chronic infarction. No abnormal enhancement. Vascular: No abnormal hyperdensity of the major intracranial arteries or dural venous sinuses. No intracranial atherosclerosis. Skull: The visualized skull  base, calvarium and extracranial soft tissues are normal. Sinuses/Orbits: No fluid levels or advanced mucosal thickening of the visualized paranasal sinuses. No mastoid or middle ear effusion. The orbits are normal. IMPRES

## 2021-12-25 NOTE — Progress Notes (Signed)
Patient ID: Stanley Juarez, male   DOB: [birth year], 76 y.o.   MRN: [id number] ? ? ? ?Referring Physician(s): ?Ramaswamy,M ? ?Supervising Physician: Aletta Edouard ? ?Patient Status:  [facility] - In-pt ? ?Chief Complaint: ? ?Dyspnea, PE ? ?Subjective: ?Pt doing well; happy to have own CPAP now; denies fever,HA,CP, worsening dyspnea, cough, abd/back pain,N/V, or bleeding; does not like lying still in bed- stressed importance of bedrest while infusion caths in place; spouse in room ? ? ?Allergies: ?Penicillins and Prednisone ? ?Medications: ?Prior to Admission medications   ?Medication Sig Start Date End Date Taking? Authorizing Provider  ?allopurinol (ZYLOPRIM) 300 MG tablet Take 150 mg by mouth daily. 12/04/21  Yes [provider]  ?aspirin EC 81 MG tablet Take 81 mg by mouth once a week. Swallow whole.   Yes [provider]  ?lisinopril (ZESTRIL) 10 MG tablet Take 10 mg by mouth daily. 12/04/21  Yes [provider]  ?traZODone (DESYREL) 100 MG tablet Take 100 mg by mouth at bedtime as needed for sleep. 12/03/21  Yes [provider]  ? ? ? ?Vital Signs: ?BP (!) 148/100   Pulse 68   Temp 97.9 ?F (36.6 ?C) (Oral)   Resp (!) 21   Ht '5\' 10"'$  (1.778 m)   Wt 237 lb 14 oz (107.9 kg)   SpO2 95%   BMI 34.13 kg/m?  ? ?Physical Exam awake/alert; HR nl, occ ectopy, resp unlabored, infusion caths rt CFV intact, dressings clean and dry, site NT, no hematoma ? ?Imaging: ?DG Chest 2 View ? ?Result Date: 12/24/2021 ?CLINICAL DATA:  Shortness of breath.  Near-syncope. EXAM: CHEST - 2 VIEW COMPARISON:  Chest two views 11/21/2010 FINDINGS: Cardiac silhouette and mediastinal contours are within normal limits. The lungs are clear. Resolution of the prior bilateral costophrenic angle mild heterogeneous airspace opacities on prior remote 2012 radiographs. No pleural effusion or pneumothorax. Mild dextrocurvature of the thoracic spine. Mild multilevel degenerative disc changes of the thoracic spine. Severe left  acromioclavicular joint space narrowing, unchanged. IMPRESSION: No active cardiopulmonary disease. Electronically Signed   By: Yvonne Kendall M.D.   On: 12/24/2021 12:57  ? ?CT HEAD W & WO CONTRAST (5MM) ? 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 RADIATION DOSE REDUCTION: This exam was performed according to the departmental dose-optimization program which includes automated exposure control, adjustment of the mA and/or kV according to patient size and/or use of iterative reconstruction technique. CONTRAST:  45m OMNIPAQUE IOHEXOL 300 MG/ML  SOLN COMPARISON:  None Available. FINDINGS: Brain: There is no mass, hemorrhage or extra-axial collection. The size and configuration of the ventricles and extra-axial CSF spaces are normal. The brain parenchyma is normal, without acute or chronic infarction. No abnormal enhancement. Vascular: No abnormal hyperdensity of the major intracranial arteries or dural venous sinuses. No intracranial atherosclerosis. Skull: The visualized skull base, calvarium and extracranial soft tissues are normal. Sinuses/Orbits: No fluid levels or advanced mucosal thickening of the visualized paranasal sinuses. No mastoid or middle ear effusion. The orbits are normal. IMPRESSION: Normal head CT. Electronically Signed   By: KUlyses JarredM.D.   On: 12/24/2021 20:12  ? ?IR Angiogram Pulmonary Bilateral Selective ? ?Result Date: 12/25/2021 ?INDICATION: 76year old male with submassive pulmonary embolism presents for catheter directed thrombolysis EXAM: ULTRASOUND-GUIDED ACCESS RIGHT COMMON FEMORAL VEIN ULTRASOUND-GUIDED ACCESS RIGHT COMMON FEMORAL VEIN PULMONARY ANGIOGRAM  PLACEMENT OF BILATERAL LYTIC CATHETERS FOR INITIATION CATHETER DIRECTED THROMBOLYSIS COMPARISON:  CT 12/24/2021 MEDICATIONS: None.  ANESTHESIA/SEDATION: Moderate (conscious) sedation was employed during this procedure. A total of Versed 0.5 mg and Fentanyl 25 mcg was administered intravenously by the radiology nurse. Total intra-service moderate Sedation Time: 50 minutes. The patient's level of consciousness and vital signs were monitored continuously by radiology nursing throughout the procedure under my direct supervision. FLUOROSCOPY: Radiation Exposure Index (as provided by the fluoroscopic device): 846 mGy Kerma COMPLICATIONS: None TECHNIQUE: Informed consent was obtained from the patient and the patient's family following explanation of the procedure, risks, benefits and alternatives. Specific risks include bleeding, infection, contrast reaction, kidney injury, venous injury, life-threatening hemorrhage including brain hemorrhage, gastrointestinal hemorrhage, epistaxis, need for further surgery, need for further procedure, cardiopulmonary collapse, death. The patient understands, agrees and consents for the procedure. All questions were addressed. Patient is position supine position on the fluoroscopy table. Maximal barrier sterile technique utilized including caps, mask, sterile gowns, sterile gloves, large sterile drape, hand hygiene, and chlorhexidine prep. 1% lidocaine used for local anesthesia. Moderate sedation was provided. Ultrasound survey of the right inguinal region was performed with images stored and sent to PACs, confirming patency of the right common femoral vein. A single wall needle was used access the right common femoral vein under ultrasound. With excellent blood flow returned, 035 wire was passed through the needle, observed to enter the IVC under fluoroscopy. The needle was removed, and a standard 6 [place] vascular sheath was placed. The dilator was removed and the sheath was flushed. Single wall needle was again used access the right common femoral vein under ultrasound, adjacent to the initial puncture. With  excellent blood flow returned, 035 wire was passed through the needle, observed to enter the IVC under fluoroscopy. The needle was removed, and a standard 6 [place] vascular sheath was placed. The dilator was removed and the sheath was flushed. Combination of a angled pigtail catheter and a Bentson wire was then used to navigate to the right heart. Angled catheter used to select the main pulmonary artery. Wire was removed and angiogram was performed. Pressure measurement was obtained. Stiff Glidewire was then advanced through the pigtail catheter, reducing the curve and flipping into the right-sided pulmonary artery. The wire was straightened into the downgoing branches of the right pulmonary artery. Pigtail catheter was then removed and a 105 cm infusion length 90cm working length UniFuse catheter was placed. Small contrast infusion confirmed position. Catheter was flushed. The alternative sheath was then used for selection of the contralateral pulmonary artery. Pigtail catheter was advanced into the right heart and used to select the main pulmonary artery. Again the exchange stiff Glidewire was used to reduce the curved and removed the pigtail catheter. A vertebral shaped 100 cm diagnostic catheter was then used to select the left-sided pulmonary arteries. Once the catheter was positioned into the lower lobar branches, contrast confirmed location. A 15 cm infusion length 90cm working length UniFuse catheter was placed. Wire was removed, small amount of contrast confirmed position and catheter was flushed. The obturator wires were then placed through both the left and right catheters. Sheaths were secured in position.  Final image was stored. The patient tolerated the procedure well and remained hemodynamically stable throughout. No complications were encountered and no significant blood loss was encountered. FINDINGS: Ultrasound survey demonstrates patent right common femoral vein. Main pulmonary artery pressure  measures 41/14 (24). Final image demonstrates right-sided catheter directed into lower lobar branches and left sided  catheter directed into lower lobar branches. The rightward sheath transmits the left-sided catheter. The le

## 2021-12-25 NOTE — Progress Notes (Signed)
ANTICOAGULATION CONSULT NOTE - follow up ? ?Pharmacy Consult for IV heparin ?Indication: pulmonary embolus ? ?Allergies  ?Allergen Reactions  ? Penicillins   ?  Did it involve swelling of the face/tongue/throat, SOB, or low BP? Yes ?Did it involve sudden or severe rash/hives, skin peeling, or any reaction on the inside of your mouth or nose? Yes ?Did you need to seek medical attention at a hospital or doctor's office? No ?When did it last happen?    childhood   ?If all above answers are "NO", may proceed with cephalosporin use. ? ?  ? Prednisone   ?  Constipation  ? ? ?Patient Measurements: ?Height: '5\' 10"'$  (177.8 cm) ?Weight: 107.9 kg (237 lb 14 oz) ?IBW/kg (Calculated) : 73 ?Heparin Dosing Weight: 97 kg ? ?Vital Signs: ?Temp: 97.9 ?F (36.6 ?C) (05/03 1926) ?Temp Source: Oral (05/03 1926) ?BP: 130/78 (05/03 1900) ?Pulse Rate: 62 (05/03 1900) ? ?Labs: ?Recent Labs  ?  12/24/21 ?1325 12/24/21 ?1325 12/24/21 ?1443 12/25/21 ?0256 12/25/21 ?0606 12/25/21 ?1141 12/25/21 ?1738  ?HGB 16.6  --   --  15.4  15.5 15.3 15.6 16.2  ?HCT 49.9  --   --  46.5  47.5 46.3 47.3 48.9  ?PLT 177  --   --  149*  145* 144* 132* 134*  ?APTT  --   --   --  90*  --   --   --   ?LABPROT  --   --   --  14.6  --   --   --   ?INR  --   --   --  1.2  --   --   --   ?HEPARINUNFRC  --    < >  --  0.32 0.35 0.30 0.30  ?CREATININE 1.33*  --   --   --   --   --   --   ?TROPONINIHS 788*  --  670*  --   --   --   --   ? < > = values in this interval not displayed.  ? ? ? ?Estimated Creatinine Clearance: 60 mL/min (A) (by C-G formula based on SCr of 1.33 mg/dL (H)). ? ? ?Medical History: ?Past Medical History:  ?Diagnosis Date  ? Cancer [facility])   ? Sleep apnea   ? ? ?Assessment: ?14 y/oM who presented to [facility] ED with acute shortness of breath and abdominal pain. High sensitivity troponin 788 > 670. CTa + for large saddle PE with evidence of right heart strain. Pharmacy consulted for IV heparin dosing for PE. Patient not on anticoagulants PTA. CBC WNL.   ? ?Significant Events ?- 5/3 @ 0005 --> catheter directed tPA instilled in IR ? ?12/25/2021  PM Update: ?- Heparin level therapeutic at 0.30, fibrinogen 413 ?- Lytic therapy completed, both R femoral sheaths removed around ~1245.  ?- No bleeding noted per RN ? ? ?Goal of Therapy:  ?Heparin level 0.3-0.7 units/ml ?Monitor platelets by anticoagulation protocol: Yes ?  ?Plan:  ?Increase heparin infusion slightly to 1650 units/hr to maintain therapeutic heparin level ?Check heparin level 6 hr after rate increase ?Can consider changing to daily HL and CBC monitoring after tPA completion ?Monitor closely for s/sx of bleeding ? ?Leone Haven, PharmD ?12/25/2021 8:52 PM ? ? ? ?

## 2021-12-25 NOTE — TOC Progression Note (Signed)
Transition of Care (TOC) - Progression Note  ? ? ?Patient Details  ?Name: Stanley Juarez ?MRN: [id number] ?Date of Birth: Sep 03, 1945 ? ?Transition of Care (TOC) CM/SW Contact  ?Purcell Mouton, RN ?Phone Number: ?12/25/2021, 1:30 PM ? ?Clinical Narrative:    ?TOC will continue to follow for discharge needs.  ? ? ?Expected Discharge Plan: [place] ?Barriers to Discharge: No Barriers Identified ? ?Expected Discharge Plan and Services ?Expected Discharge Plan: [place] ?  ?  ?Post Acute Care Choice: Home Health, Nursing Home ?Living arrangements for the past 2 months: [place] ?                ?  ?  ?  ?  ?  ?  ?  ?  ?  ?  ? ? ?Social Determinants of Health (SDOH) Interventions ?  ? ?Readmission Risk Interventions ?   ? View : No data to display.  ?  ?  ?  ? ? ?

## 2021-12-25 NOTE — Procedures (Signed)
Interventional Radiology Procedure Note ? ?Procedure: Pulmonary artery pressure measurements and completion of pulmonary arterial thrombolytic therapy ? ?Complications: None ? ?Findings: ?PA pressures now approximately 36/13 mm Hg, mean 21 (41/14/24 prior to lytic therapy). Despite only modest decrease, patient feels symptomatically significantly improved and is hemodynamically stable. ? ?Lytic therapy discontinued. Both right femoral venous sheaths removed and pressure held. 2 hour bedrest. ? ?Stanley Juarez, M.D ?Pager:  [phone] ? ?  ?

## 2021-12-25 NOTE — Progress Notes (Signed)
PT transported to ICU with IR tech and this RN. Bedside report given to [place], RN and site assessed at bedside. All questions answered prior to this RN's departure from ICU.  ? ?Pt left IR at 00:18 ?

## 2021-12-26 ENCOUNTER — Telehealth: Payer: Self-pay | Admitting: Internal Medicine

## 2021-12-26 ENCOUNTER — Inpatient Hospital Stay (HOSPITAL_COMMUNITY): Payer: Medicare HMO

## 2021-12-26 DIAGNOSIS — I2602 Saddle embolus of pulmonary artery with acute cor pulmonale: Secondary | ICD-10-CM | POA: Diagnosis not present

## 2021-12-26 DIAGNOSIS — I2699 Other pulmonary embolism without acute cor pulmonale: Secondary | ICD-10-CM | POA: Diagnosis not present

## 2021-12-26 LAB — COMPREHENSIVE METABOLIC PANEL
ALT: 21 U/L (ref 0–44)
ALT: 19 U/L (ref 0–44)
AST: 20 U/L (ref 15–41)
AST: 17 U/L (ref 15–41)
Albumin: 3.4 g/dL — ABNORMAL LOW (ref 3.5–5.0)
Albumin: 3.2 g/dL — ABNORMAL LOW (ref 3.5–5.0)
Alkaline Phosphatase: 63 U/L (ref 38–126)
Alkaline Phosphatase: 60 U/L (ref 38–126)
Anion gap: 8 (ref 5–15)
Anion gap: 5 (ref 5–15)
BUN: 18 mg/dL (ref 8–23)
BUN: 16 mg/dL (ref 8–23)
CO2: 23 mmol/L (ref 22–32)
CO2: 22 mmol/L (ref 22–32)
Calcium: 8.6 mg/dL — ABNORMAL LOW (ref 8.9–10.3)
Calcium: 8.3 mg/dL — ABNORMAL LOW (ref 8.9–10.3)
Chloride: 107 mmol/L (ref 98–111)
Chloride: 111 mmol/L (ref 98–111)
Creatinine, Ser: 1.11 mg/dL (ref 0.61–1.24)
Creatinine, Ser: 1.09 mg/dL (ref 0.61–1.24)
GFR, Estimated: >60 mL/min (ref >60)
GFR, Estimated: >60 mL/min (ref >60)
Glucose, Bld: 115 mg/dL — ABNORMAL HIGH (ref 70–99)
Glucose, Bld: 121 mg/dL — ABNORMAL HIGH (ref 70–99)
Potassium: 4 mmol/L (ref 3.5–5.1)
Potassium: 3.8 mmol/L (ref 3.5–5.1)
Sodium: 138 mmol/L (ref 135–145)
Sodium: 138 mmol/L (ref 135–145)
Total Bilirubin: 0.9 mg/dL (ref 0.3–1.2)
Total Bilirubin: 0.7 mg/dL (ref 0.3–1.2)
Total Protein: 6.3 g/dL — ABNORMAL LOW (ref 6.5–8.1)
Total Protein: 6.2 g/dL — ABNORMAL LOW (ref 6.5–8.1)

## 2021-12-26 LAB — CALCIUM, IONIZED: Calcium, Ionized, Serum: 4.7 mg/dL (ref 4.5–5.6)

## 2021-12-26 LAB — CBC
HCT: 48.2 % (ref 39.0–52.0)
HCT: 45.5 % (ref 39.0–52.0)
Hemoglobin: 15.6 g/dL (ref 13.0–17.0)
Hemoglobin: 15.2 g/dL (ref 13.0–17.0)
MCH: 30.4 pg (ref 26.0–34.0)
MCH: 31.3 pg (ref 26.0–34.0)
MCHC: 32.4 g/dL (ref 30.0–36.0)
MCHC: 33.4 g/dL (ref 30.0–36.0)
MCV: 93.8 fL (ref 80.0–100.0)
MCV: 93.6 fL (ref 80.0–100.0)
Platelets: 126 10*3/uL — ABNORMAL LOW (ref 150–400)
Platelets: 127 10*3/uL — ABNORMAL LOW (ref 150–400)
RBC: 5.14 MIL/uL (ref 4.22–5.81)
RBC: 4.86 MIL/uL (ref 4.22–5.81)
RDW: 13.2 % (ref 11.5–15.5)
RDW: 13.2 % (ref 11.5–15.5)
WBC: 6.7 10*3/uL (ref 4.0–10.5)
WBC: 5.9 10*3/uL (ref 4.0–10.5)
nRBC: 0 % (ref 0.0–0.2)
nRBC: 0 % (ref 0.0–0.2)

## 2021-12-26 LAB — HEPARIN LEVEL (UNFRACTIONATED)
Heparin Unfractionated: 0.64 IU/mL (ref 0.30–0.70)
Heparin Unfractionated: 0.6 IU/mL (ref 0.30–0.70)

## 2021-12-26 NOTE — Progress Notes (Signed)
ANTICOAGULATION CONSULT NOTE - follow up ? ?Pharmacy Consult for IV heparin ?Indication: pulmonary embolus ? ?Allergies  ?Allergen Reactions  ? Penicillins   ?  Did it involve swelling of the face/tongue/throat, SOB, or low BP? Yes ?Did it involve sudden or severe rash/hives, skin peeling, or any reaction on the inside of your mouth or nose? Yes ?Did you need to seek medical attention at a hospital or doctor's office? No ?When did it last happen?    childhood   ?If all above answers are "NO", may proceed with cephalosporin use. ? ?  ? Prednisone   ?  Constipation  ? ? ?Patient Measurements: ?Height: '5\' 10"'$  (177.8 cm) ?Weight: 107.9 kg (237 lb 14 oz) ?IBW/kg (Calculated) : 73 ?Heparin Dosing Weight: 97 kg ? ?Vital Signs: ?Temp: 98.9 ?F (37.2 ?C) (05/04 0000) ?Temp Source: Axillary (05/04 0000) ?BP: 148/69 (05/04 0300) ?Pulse Rate: 68 (05/04 0300) ? ?Labs: ?Recent Labs  ?  12/24/21 ?1325 12/24/21 ?1443 12/25/21 ?0256 12/25/21 ?0606 12/25/21 ?1141 12/25/21 ?1738 12/26/21 ?0308  ?HGB 16.6  --  15.4  15.5   < > 15.6 16.2 15.6  ?HCT 49.9  --  46.5  47.5   < > 47.3 48.9 48.2  ?PLT 177  --  149*  145*   < > 132* 134* 126*  ?APTT  --   --  90*  --   --   --   --   ?LABPROT  --   --  14.6  --   --   --   --   ?INR  --   --  1.2  --   --   --   --   ?HEPARINUNFRC  --   --  0.32   < > 0.30 0.30 0.64  ?CREATININE 1.33*  --   --   --   --   --  1.11  ?TROPONINIHS 788* 670*  --   --   --   --   --   ? < > = values in this interval not displayed.  ? ? ? ?Estimated Creatinine Clearance: 71.8 mL/min (by C-G formula based on SCr of 1.11 mg/dL). ? ? ?Medical History: ?Past Medical History:  ?Diagnosis Date  ? Cancer [facility])   ? Sleep apnea   ? ? ?Assessment: ?65 y/oM who presented to [facility] ED with acute shortness of breath and abdominal pain. High sensitivity troponin 788 > 670. CTa + for large saddle PE with evidence of right heart strain. Pharmacy consulted for IV heparin dosing for PE. Patient not on anticoagulants PTA. CBC WNL.   ? ?Significant Events ?- 5/3 @ 0005 --> catheter directed tPA instilled in IR ?- Lytic therapy completed, both R femoral sheaths removed around ~1245.  ? ?12/26/2021   ?- Heparin level therapeutic at 0.64 ?- Hgb 15.6, plts down to 126 ?- No bleeding noted per RN ? ? ?Goal of Therapy:  ?Heparin level 0.3-0.7 units/ml ?Monitor platelets by anticoagulation protocol: Yes ?  ?Plan:  ?Continue heparin drip at 1650 units/hr ?Daily heparin level and CBC ?Monitor closely for s/sx of bleeding ? ?Dolly Rias RPh ?12/26/2021, 3:58 AM ? ? ? ?

## 2021-12-26 NOTE — Telephone Encounter (Signed)
Triage ? ?Pls give 2 week pulm followu with APP and 2-3 month fu with Dr Silas Flood for PE ?

## 2021-12-26 NOTE — Progress Notes (Signed)
? ?NAME:  Stanley Juarez, MRN:  [id number], DOB:  [birth year], LOS: 2 ?ADMISSION DATE:  12/24/2021, CONSULTATION DATE:  12/24/21  ?REFERRING MD:  Langston Masker, CHIEF COMPLAINT:  sob   ? ?BRIEF  ?76 yo man with hx HTN, OSA, gout, here with  ?SOB since 12/23/21 pm.   ?Dizzines ?Was moving wood at the time.  Discomfort in lower chest radiating towards abd.   ? ?Bradycardia to 40s intermittently in the ED, normal bp  ?O2 93-97 on RA  ? ?Started on Heparin in ED  ? ?IR consulted, plan for cath directed TPA given size of PE, R H strain, elevated trop.  ? 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   ?Pertinent  Medical History  ? ?OSA on CPAP ?HTN  ?Gout ?Intervertebral disc degeneration, lumbar ?Insomnia ?ED ? ? has a past medical history of Cancer ([place]) and Sleep apnea. ? ? has a past surgical history that includes skin cancer removal; IR US Guide Vasc Access Right (12/25/2021); IR INFUSION THROMBOL ARTERIAL INITIAL (MS) (12/25/2021); IR INFUSION THROMBOL ARTERIAL INITIAL (MS) (12/25/2021); IR US Guide Vasc Access Right (12/25/2021); IR Angiogram Pulmonary Bilateral Selective (12/25/2021); and IR THROMB F/U EVAL ART/VEN FINAL DAY (MS) (12/25/2021). ? ? ?Significant Hospital Events: ?Including procedures, antibiotic start and stop dates in addition to other pertinent events   ?12/24/2021 - PESI score 114, class 4, high risk.   BOVA 18, Stage 2 intermeidate risk -. EKOS at 11.50pm ?12/25/21:  ongoing EKOS tPA x 24h. He is upset that he has to keep hip straight and lie down. Wants it out. Informed him and wife he is getting very high risk medication and infusion is for 24h . He thinks it is 12h. Says he cannnot handle the inconvenience. Wants to eat. Used BiPAP dream station last night. Did not like it. BP 140sbp ->says is home baseline. On room air. Normal mental status. No resp distres. Normal heart rate ? ?Interim History / Subjective:  ? ?12/26/21: On IV heparin. Off EKOS. Feels wll. On room air. Wants to go home. ADvised him of his serious  illness and need to stay ? ? ?Objective   ?Blood pressure 118/72, pulse (!) 50, temperature 97.8 ?F (36.6 ?C), temperature source Axillary, resp. rate 16, height '5\' 10"'$  (1.778 m), weight 105.8 kg, SpO2 99 %. ?   ?   ? ?Intake/Output Summary (Last 24 hours) at 12/26/2021 0905 ?Last data filed at 12/26/2021 0800 ?Gross per 24 hour  ?Intake 416.58 ml  ?Output 950 ml  ?Net -533.42 ml  ? ?Filed Weights  ? 12/24/21 1236 12/25/21 0100 12/26/21 0438  ?Weight: 107 kg 107.9 kg 105.8 kg  ? ?General Appearance:  Looks well. Sitting in chair. Wife at bedside ?Head:  Normocephalic, without obvious abnormality, atraumatic ?Eyes:  PERRL - yes, conjunctiva/corneas - muddy     ?Ears:  Normal external ear canals, both ears ?Nose:  G tube - no ?Throat:  ETT TUBE - no , OG tube - no ?Neck:  Supple,  No enlargement/tenderness/nodules ?Lungs: Clear to auscultation bilaterally,  ?Heart:  S1 and S2 normal, no murmur, CVP - no.  Pressors - no ?Abdomen:  Soft, no masses, no organomegaly ?Genitalia / Rectal:  Not done ?Extremities:  Extremities- intact ?Skin:  ntact in exposed areas . Sacral area - not examined ?Neurologic:  Sedation - none -> RASS - +1 . Moves all 4s - yes. CAM-ICU - neg . Orientation - x3+ ? ? ? ? ?Resolved Hospital Problem list   ? ?  Assessment & Plan:  ?76 yo man with  ?Acute PE - submassive (CT head negative):  EKOS 24h since 23.50 12/24/21 - completed 12/25/21 ? ?12/26/21:  On IV heparin gtt. STable vitals Heparin level theraptucit ? ? ?Plan ? -IV heparin gtt for total 3-4 days -> then DOAC x 12-24h -> then home ? - monitor ?- track trop, bnp, d-dimer, for improvement ? ?2. HTN:  - home lisinoprol. Runs 140 sbp at home ? ?12/26/21 - SBP 127 after starting norvasc yesterday ? ?Plan ? - no ace inhibitor ? - cotninue norvasc ? ? ?3. Gout ? ?5/4 - not active ? ?Plan ? - home allopurinol ? ?4. OSA - CPAP at night.  ? ?543 - continue cPAP /biPPA at night ? ?5. Renal Masses: - new dx present on admit ? ?Plan ?-  will need urology consult  when stable for work up.   ?  ? ?Best Practice (right click and "Reselect all SmartList Selections" daily)  ? ?Diet/type: c regular diet ?DVT prophylaxis: other ?GI prophylaxis: PPI ?Lines: N/A ?Foley:  N/A ?Code Status:  full code ?Last date of multidisciplinary goals of care discussion '[]'$  - he and wife updated at bddide 12/26/21 ? ? ?Move to tele 12/26/2021 and give to [facility] as primary d/w Dr Louanne Belton ?CCM will arrange opd followup ? ? ?[place]  ? ? ? ?Dr. Brand Males, M.D., F.C.C.P ?Pulmonary and Critical Care Medicine ?Medical Director - [facility] ICU ?Staff Physician, [place] ?[place] [facility] ?Pager: [phone], If no answer or between  15:00h - 7:00h: call [phone] ? ?12/26/2021 ?9:05 AM ? ? ?LABS  ? ? ?PULMONARY ?No results for input(s): PHART, PCO2ART, PO2ART, HCO3, TCO2, O2SAT in the last 168 hours. ? ?Invalid input(s): PCO2, PO2 ? ?CBC ?Recent Labs  ?Lab 12/25/21 ?1141 12/25/21 ?1738 12/26/21 ?0308  ?HGB 15.6 16.2 15.6  ?HCT 47.3 48.9 48.2  ?WBC 8.4 7.4 6.7  ?PLT 132* 134* 126*  ? ? ?COAGULATION ?Recent Labs  ?Lab 12/25/21 ?0256  ?INR 1.2  ? ? ?CARDIAC ? No results for input(s): TROPONINI in the last 168 hours. ?No results for input(s): PROBNP in the last 168 hours. ? ? ?CHEMISTRY ?Recent Labs  ?Lab 12/24/21 ?1325 12/25/21 ?0256 12/26/21 ?0308  ?NA 138  --  138  ?K 5.1 4.4 4.0  ?CL 107  --  107  ?CO2 26  --  23  ?GLUCOSE 113*  --  115*  ?BUN 20  --  18  ?CREATININE 1.33*  --  1.11  ?CALCIUM 9.2  --  8.6*  ?MG  --  2.0  --   ? ?Estimated Creatinine Clearance: 71.1 mL/min (by C-G formula based on SCr of 1.11 mg/dL). ? ? ?LIVER ?Recent Labs  ?Lab 12/24/21 ?1325 12/25/21 ?0256 12/26/21 ?0308  ?AST 31  --  20  ?ALT 29  --  21  ?ALKPHOS 65  --  63  ?BILITOT 1.4*  --  0.9  ?PROT 7.5  --  6.3*  ?ALBUMIN 4.0  --  3.4*  ?INR  --  1.2  --   ? ? ? ?INFECTIOUS ?No results for input(s): LATICACIDVEN, PROCALCITON in the last 168 hours. ? ? ?ENDOCRINE ?CBG  (last 3)  ?No results for input(s): GLUCAP in the last 72 hours. ? ? ? ? ? ? ?IMAGING x48h  - image(s) personally visualized  -   highlighted in bold ?DG Chest 2 View ? ?Result Date:  12/24/2021 ?CLINICAL DATA:  Shortness of breath.  Near-syncope. EXAM: CHEST - 2 VIEW COMPARISON:  Chest two views 11/21/2010 FINDINGS: Cardiac silhouette and mediastinal contours are within normal limits. The lungs are clear. Resolution of the prior bilateral costophrenic angle mild heterogeneous airspace opacities on prior remote 2012 radiographs. No pleural effusion or pneumothorax. Mild dextrocurvature of the thoracic spine. Mild multilevel degenerative disc changes of the thoracic spine. Severe left acromioclavicular joint space narrowing, unchanged. IMPRESSION: No active cardiopulmonary disease. Electronically Signed   By: Yvonne Kendall M.D.   On: 12/24/2021 12:57  ? ?CT HEAD W & WO CONTRAST (5MM) ? 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 RADIATION DOSE REDUCTION: This exam was performed according to the departmental dose-optimization program which includes automated exposure control, adjustment of the mA and/or kV according to patient size and/or use of iterative reconstruction technique. CONTRAST:  77m OMNIPAQUE IOHEXOL 300 MG/ML  SOLN COMPARISON:  None Available. FINDINGS: Brain: There is no mass, hemorrhage or extra-axial collection. The size and configuration of the ventricles and extra-axial CSF spaces are normal. The brain parenchyma is normal, without acute or chronic infarction. No abnormal enhancement. Vascular: No abnormal hyperdensity of the major intracranial arteries or dural venous sinuses. No intracranial atherosclerosis. Skull: The visualized skull base, calvarium and extracranial soft tissues are normal.  Sinuses/Orbits: No fluid levels or advanced mucosal thickening of the visualized paranasal sinuses. No mastoid or middle ear effusion. The orbits are normal. IMPRESSION: Normal head CT. Electronically Signed   By: KLennette Bihari

## 2021-12-26 NOTE — Progress Notes (Signed)
Patient's wife found a tick on the right, upper, lateral back. Tick placed in specimen cup, redness noted, marked, and measured at 9cm x 2cm x 0cm. MD notified.  ? ?Annitta Jersey, RN ?12/26/2021 ?2:47 PM ? ?

## 2021-12-26 NOTE — Progress Notes (Signed)
ANTICOAGULATION CONSULT NOTE - follow up ? ?Pharmacy Consult for IV heparin ?Indication: pulmonary embolus ? ?Allergies  ?Allergen Reactions  ? Penicillins   ?  Did it involve swelling of the face/tongue/throat, SOB, or low BP? Yes ?Did it involve sudden or severe rash/hives, skin peeling, or any reaction on the inside of your mouth or nose? Yes ?Did you need to seek medical attention at a hospital or doctor's office? No ?When did it last happen?    childhood   ?If all above answers are "NO", may proceed with cephalosporin use. ? ?  ? Prednisone   ?  Constipation  ? ? ?Patient Measurements: ?Height: '5\' 10"'$  (177.8 cm) ?Weight: 105.8 kg (233 lb 4 oz) ?IBW/kg (Calculated) : 73 ?Heparin Dosing Weight: 97 kg ? ?Vital Signs: ?Temp: 98.7 ?F (37.1 ?C) (05/04 1225) ?Temp Source: Oral (05/04 1225) ?BP: 132/77 (05/04 1200) ?Pulse Rate: 61 (05/04 1200) ? ?Labs: ?Recent Labs  ?  12/24/21 ?1325 12/24/21 ?1443 12/25/21 ?0256 12/25/21 ?0606 12/25/21 ?1738 12/26/21 ?0308 12/26/21 ?1036 12/26/21 ?1343  ?HGB 16.6  --  15.4  15.5   < > 16.2 15.6 15.2  --   ?HCT 49.9  --  46.5  47.5   < > 48.9 48.2 45.5  --   ?PLT 177  --  149*  145*   < > 134* 126* 127*  --   ?APTT  --   --  90*  --   --   --   --   --   ?LABPROT  --   --  14.6  --   --   --   --   --   ?INR  --   --  1.2  --   --   --   --   --   ?HEPARINUNFRC  --   --  0.32   < > 0.30 0.64  --  0.60  ?CREATININE 1.33*  --   --   --   --  1.11 1.09  --   ?TROPONINIHS 788* 670*  --   --   --   --   --   --   ? < > = values in this interval not displayed.  ? ? ? ?Estimated Creatinine Clearance: 72.4 mL/min (by C-G formula based on SCr of 1.09 mg/dL). ? ? ?Medical History: ?Past Medical History:  ?Diagnosis Date  ? Cancer [facility])   ? Sleep apnea   ? ? ?Assessment: ?48 y/oM who presented to [facility] ED with acute shortness of breath and abdominal pain. High sensitivity troponin 788 > 670. CTa + for large saddle PE with evidence of right heart strain. Pharmacy consulted for IV heparin dosing  for PE. Patient not on anticoagulants PTA. CBC WNL.  ? ?Significant Events ?- 5/3 @ 0005 --> catheter directed tPA instilled in IR ?- Lytic therapy completed, both R femoral sheaths removed around ~1245.  ? ?12/26/2021   ?- Heparin level remains therapeutic at 0.6 on heparin at 1650 units/hr ?- Hgb 15.2, plts 127 ?- No bleeding noted per RN.  Bruising around femoral sites, but no oozing or bleeding.   ? ? ?Goal of Therapy:  ?Heparin level 0.3-0.7 units/ml ?Monitor platelets by anticoagulation protocol: Yes ?  ?Plan:  ?Continue heparin drip at 1650 units/hr ?Daily heparin level and CBC ?Monitor closely for s/sx of bleeding ? ?Gretta Arab PharmD, BCPS ?Clinical Pharmacist ?[facility] [phone] ?12/26/2021 2:38 PM ? ? ? ?

## 2021-12-26 NOTE — Progress Notes (Signed)
BLE venous duplex has been completed.  Preliminary findings given to Dr. Chase Caller via secure chat. ? ? ?Results can be found under chart review under CV PROC. ?12/26/2021 11:17 AM ?Deserae Jennings RVT, RDMS ? ?

## 2021-12-26 NOTE — Progress Notes (Signed)
Patient arrived to 5 East unit from ICU at 1730, patient alert and oriented x 4, vital signs within normal limits.  ?

## 2021-12-27 DIAGNOSIS — N2889 Other specified disorders of kidney and ureter: Secondary | ICD-10-CM

## 2021-12-27 DIAGNOSIS — R0602 Shortness of breath: Secondary | ICD-10-CM | POA: Diagnosis not present

## 2021-12-27 DIAGNOSIS — I2602 Saddle embolus of pulmonary artery with acute cor pulmonale: Secondary | ICD-10-CM | POA: Diagnosis not present

## 2021-12-27 LAB — MAGNESIUM: Magnesium: 2.1 mg/dL (ref 1.7–2.4)

## 2021-12-27 LAB — CBC
HCT: 44.1 % (ref 39.0–52.0)
Hemoglobin: 14.4 g/dL (ref 13.0–17.0)
MCH: 30.7 pg (ref 26.0–34.0)
MCHC: 32.7 g/dL (ref 30.0–36.0)
MCV: 94 fL (ref 80.0–100.0)
Platelets: 141 10*3/uL — ABNORMAL LOW (ref 150–400)
RBC: 4.69 MIL/uL (ref 4.22–5.81)
RDW: 13.2 % (ref 11.5–15.5)
WBC: 6 10*3/uL (ref 4.0–10.5)
nRBC: 0 % (ref 0.0–0.2)

## 2021-12-27 LAB — TROPONIN I (HIGH SENSITIVITY): Troponin I (High Sensitivity): 41 ng/L — ABNORMAL HIGH (ref <18)

## 2021-12-27 LAB — BRAIN NATRIURETIC PEPTIDE: B Natriuretic Peptide: 72.3 pg/mL (ref 0.0–100.0)

## 2021-12-27 LAB — HEPARIN LEVEL (UNFRACTIONATED): Heparin Unfractionated: 0.58 IU/mL (ref 0.30–0.70)

## 2021-12-27 LAB — D-DIMER, QUANTITATIVE: D-Dimer, Quant: 6.03 ug/mL-FEU — ABNORMAL HIGH (ref 0.00–0.50)

## 2021-12-27 NOTE — Telephone Encounter (Signed)
Patient is schedule for HFU on 01/09/2022 at 12pm with [facility]. Scheduled for new patient with [place] on 02/28/2022- appointment reminder mailed to patient. Nothing further needed.  ?

## 2021-12-27 NOTE — Progress Notes (Signed)
[facility] ?PROGRESS NOTE ? ? ? ?Progress Note  ?AMED DATTA  GEX:[id number] DOB: 19-Nov-1945 DOA: 12/24/2021 ?PCP: Mayra Neer, MD  ? ? ? ?Brief Narrative:  ? ?Stanley Juarez is an 76 y.o. male past medical history of hypertension obstructive sleep apnea comes in on 12/23/2021 for dizziness CT angio of the chest showed PE and bilateral renal masses about 7 cm on the right 2.5 cm on the left started on IV heparin pulmonary was consulted as well as IR patient received catheter directed tPA given the size of PE and right heart strain. ? ? ? ? ?Assessment/Plan:  ? ?Acute Pulmonary embolism ([place]) ?Submassive status post tPA. ?Continue IV heparin for an additional 24 hours. ?Can probably transition him to oral DOAC tomorrow morning. ? ?Essential hypertension: ?Blood pressure is trending about 119/75 to 144/87.  Continue to monitor closely. ?Continue to hold antihypertensive medication. ? ?History of gout: ?Continue home dose of apparently normal. ? ?Incidental bilateral renal mass: ?Need neurology follow-up as an outpatient for further work-up. ? ?Obstructive sleep apnea: ?continue CPAP at night. ? ?DVT prophylaxis: heparin ?Family Communication:none ?Status is: Inpatient ?Remains inpatient appropriate because: Transition him to an oral DOAC tomorrow. ? ? ? ?Code Status:  ? ?  ?Code Status Orders  ?(From admission, onward)  ?  ? ? ?  ? ?  Start     Ordered  ? 12/24/21 2222  Full code  Continuous       ? 12/24/21 2227  ? ?  ?  ? ?  ? ?Code Status History   ? ? This patient has a current code status but no historical code status.  ? ?  ? ? ? ? ?IV Access:  ? ?Peripheral IV ? ? ?Procedures and diagnostic studies:  ? ?VAS [place] LOWER EXTREMITY VENOUS (DVT) ? ?Result Date: 12/26/2021 ? Lower Venous DVT Study Patient Name:  Stanley Juarez  Date of Exam:   12/26/2021 Medical Rec #: [id number]       Accession #:    [id number] Date of Birth: 1946/06/12        Patient Gender: M Patient Age:   85 years Exam Location:  [facility] Procedure:      VAS [place] LOWER EXTREMITY VENOUS (DVT) Referring Phys: Brand Males --------------------------------------------------------------------------------  Indications: Pulmonary embolism.  Comparison Study: No previous exams Performing Technologist: Jody Hill RVT, RDMS  Examination Guidelines: A complete evaluation includes B-mode imaging, spectral Doppler, color Doppler, and power Doppler as needed of all accessible portions of each vessel. Bilateral testing is considered an integral part of a complete examination. Limited examinations for reoccurring indications may be performed as noted. The reflux portion of the exam is performed with the patient in reverse Trendelenburg.  +---------+---------------+---------+-----------+----------+-------------------+ RIGHT    CompressibilityPhasicitySpontaneityPropertiesThrombus Aging      +---------+---------------+---------+-----------+----------+-------------------+ CFV      Full           Yes      Yes                                      +---------+---------------+---------+-----------+----------+-------------------+ SFJ      Full                                                             +---------+---------------+---------+-----------+----------+-------------------+  FV Prox  Full           Yes      Yes                                      +---------+---------------+---------+-----------+----------+-------------------+ FV Mid   Partial        Yes      Yes                  Acute               +---------+---------------+---------+-----------+----------+-------------------+ FV DistalPartial        Yes      Yes                  Age Indeterminate   +---------+---------------+---------+-----------+----------+-------------------+ PFV      Full                                                             +---------+---------------+---------+-----------+----------+-------------------+ POP      Partial         Yes      Yes                  Acute               +---------+---------------+---------+-----------+----------+-------------------+ PTV      Full                                                             +---------+---------------+---------+-----------+----------+-------------------+ PERO     Full                                                             +---------+---------------+---------+-----------+----------+-------------------+ Gastroc  Partial        No       No                   Acute               +---------+---------------+---------+-----------+----------+-------------------+ SSV      Partial        No       No                   Acute @ origin Age                                                        Indeterminate @  prox/mid            +---------+---------------+---------+-----------+----------+-------------------+ Proximal CFV not visualized due to pressure dressing from procedure.  +---------+---------------+---------+-----------+----------+-------------------+ LEFT     CompressibilityPhasicitySpontaneityPropertiesThrombus Aging      +---------+---------------+---------+-----------+----------+-------------------+ CFV      Full           Yes      Yes                                      +---------+---------------+---------+-----------+----------+-------------------+ SFJ      Full                                                             +---------+---------------+---------+-----------+----------+-------------------+ FV Prox  Full           Yes      Yes                                      +---------+---------------+---------+-----------+----------+-------------------+ FV Mid   Full           Yes      Yes                                      +---------+---------------+---------+-----------+----------+-------------------+ FV DistalFull           Yes      Yes                                       +---------+---------------+---------+-----------+----------+-------------------+ PFV      Full                                                             +---------+---------------+---------+-----------+----------+-------------------+ POP      Full           Yes      Yes                                      +---------+---------------+---------+-----------+----------+-------------------+ PTV      Full                                         Not well visualized +---------+---------------+---------+-----------+----------+-------------------+ PERO     Full                                         Not well visualized +---------+---------------+---------+-----------+----------+-------------------+     Summary: BILATERAL: -No evidence of popliteal cyst, bilaterally. RIGHT: - Findings consistent with acute deep vein thrombosis involving the right  femoral vein, right popliteal vein, and right gastrocnemius veins. - Findings consistent with acute superficial vein thrombosis involving the right small saphenous vein. - Findings consistent with age indeterminate deep vein thrombosis involving the right femoral vein. - Findings consistent with age indeterminate superficial vein thrombosis involving the right small saphenous vein.  LEFT: - There is no evidence of deep vein thrombosis in the lower extremity. - There is no evidence of superficial venous thrombosis.  *See table(s) above for measurements and observations. Electronically signed by Monica Martinez MD on 12/26/2021 at 5:01:24 PM.    Final   ? ?IR THROMB F/U EVAL ART/VEN FINAL DAY (MS) ? ?Result Date: 12/25/2021 ?INDICATION: Status post initiation of bilateral pulmonary arterial catheter directed thrombolytic therapy for submassive bilateral pulmonary embolism with infusion beginning around midnight earlier today. EXAM: COMPLETION OF BILATERAL PULMONARY ARTERIAL THROMBOLYTIC THERAPY WITH PULMONARY ARTERY  PRESSURE MEASUREMENT COMPARISON:  Imaging earlier today. MEDICATIONS: None. ANESTHESIA/SEDATION: None FLUOROSCOPY: None COMPLICATIONS: None immediate. TECHNIQUE: Pressure measurements were obtained at the level of bilateral pulmonary arterial thrombolytic infusion catheters after removal of the central occlusion wires. The fusion catheters were then removed. Both right comm

## 2021-12-27 NOTE — Plan of Care (Signed)
  Problem: Education: Goal: Knowledge of General Education information will improve Description: Including pain rating scale, medication(s)/side effects and non-pharmacologic comfort measures Outcome: Progressing   Problem: Clinical Measurements: Goal: Ability to maintain clinical measurements within normal limits will improve Outcome: Progressing   

## 2021-12-27 NOTE — Progress Notes (Signed)
Pt has his home unit and prefer self placement.  ?

## 2021-12-27 NOTE — Progress Notes (Signed)
ANTICOAGULATION CONSULT NOTE - follow up ? ?Pharmacy Consult for IV heparin ?Indication: pulmonary embolus ? ?Allergies  ?Allergen Reactions  ? Penicillins   ?  Did it involve swelling of the face/tongue/throat, SOB, or low BP? Yes ?Did it involve sudden or severe rash/hives, skin peeling, or any reaction on the inside of your mouth or nose? Yes ?Did you need to seek medical attention at a hospital or doctor's office? No ?When did it last happen?    childhood   ?If all above answers are "NO", may proceed with cephalosporin use. ? ?  ? Prednisone   ?  Constipation  ? ? ?Patient Measurements: ?Height: '5\' 10"'$  (177.8 cm) ?Weight: 105.8 kg (233 lb 4 oz) ?IBW/kg (Calculated) : 73 ?Heparin Dosing Weight: 97 kg ? ?Vital Signs: ?Temp: 98.4 ?F (36.9 ?C) (05/04 2216) ?Temp Source: Oral (05/04 2216) ?BP: 144/87 (05/04 2216) ?Pulse Rate: 64 (05/04 2216) ? ?Labs: ?Recent Labs  ?  12/24/21 ?1325 12/24/21 ?1443 12/25/21 ?0256 12/25/21 ?0606 12/26/21 ?0308 12/26/21 ?1036 12/26/21 ?1343 12/27/21 ?6503  ?HGB 16.6  --  15.4  15.5   < > 15.6 15.2  --  14.4  ?HCT 49.9  --  46.5  47.5   < > 48.2 45.5  --  44.1  ?PLT 177  --  149*  145*   < > 126* 127*  --  141*  ?APTT  --   --  90*  --   --   --   --   --   ?LABPROT  --   --  14.6  --   --   --   --   --   ?INR  --   --  1.2  --   --   --   --   --   ?HEPARINUNFRC  --   --  0.32   < > 0.64  --  0.60 0.58  ?CREATININE 1.33*  --   --   --  1.11 1.09  --   --   ?TROPONINIHS 788* 670*  --   --   --   --   --  41*  ? < > = values in this interval not displayed.  ? ? ? ?Estimated Creatinine Clearance: 72.4 mL/min (by C-G formula based on SCr of 1.09 mg/dL). ? ? ?Medical History: ?Past Medical History:  ?Diagnosis Date  ? Cancer [facility])   ? Sleep apnea   ? ? ?Assessment: ?80 y/oM who presented to [facility] ED with acute shortness of breath and abdominal pain. High sensitivity troponin 788 > 670. CTa + for large saddle PE with evidence of right heart strain. Pharmacy consulted for IV heparin dosing for  PE. Patient not on anticoagulants PTA. CBC WNL.  ? ?Significant Events ?- 5/3 @ 0005 --> catheter directed tPA instilled in IR ?- Lytic therapy completed, both R femoral sheaths removed around ~1245.  ? ?12/27/2021   ?- Heparin level remains therapeutic at 0.58 on heparin at 1650 units/hr ?- Hgb 14.4, plts 141 ?- No bleeding noted per RN.  Bruising around femoral sites, but no oozing or bleeding.   ?5/4 Dopplers + for RLE acute DVT ? ?Goal of Therapy:  ?Heparin level 0.3-0.7 units/ml ?Monitor platelets by anticoagulation protocol: Yes ?  ?Plan:  ?Continue heparin drip at 1650 units/hr ?Daily heparin level and CBC ?Monitor closely for s/sx of bleeding ?F/u for transition to [place] ? ? ?Eudelia Bunch, Pharm.D ?12/27/2021 7:34 AM ? ? ? ?

## 2021-12-28 DIAGNOSIS — I2609 Other pulmonary embolism with acute cor pulmonale: Secondary | ICD-10-CM

## 2021-12-28 DIAGNOSIS — I2602 Saddle embolus of pulmonary artery with acute cor pulmonale: Secondary | ICD-10-CM | POA: Diagnosis not present

## 2021-12-28 DIAGNOSIS — N2889 Other specified disorders of kidney and ureter: Secondary | ICD-10-CM | POA: Diagnosis not present

## 2021-12-28 LAB — CBC
HCT: 44.5 % (ref 39.0–52.0)
Hemoglobin: 14.7 g/dL (ref 13.0–17.0)
MCH: 30.4 pg (ref 26.0–34.0)
MCHC: 33 g/dL (ref 30.0–36.0)
MCV: 91.9 fL (ref 80.0–100.0)
Platelets: 165 10*3/uL (ref 150–400)
RBC: 4.84 MIL/uL (ref 4.22–5.81)
RDW: 13.2 % (ref 11.5–15.5)
WBC: 6 10*3/uL (ref 4.0–10.5)
nRBC: 0 % (ref 0.0–0.2)

## 2021-12-28 LAB — HEPARIN LEVEL (UNFRACTIONATED): Heparin Unfractionated: 1.04 IU/mL — ABNORMAL HIGH (ref 0.30–0.70)

## 2021-12-28 MED ORDER — APIXABAN 5 MG PO TABS: 10.0000 mg | ORAL_TABLET | Freq: Two times a day (BID) | ORAL | Status: DC

## 2021-12-28 MED ORDER — RIVAROXABAN 20 MG PO TABS: 20.0000 mg | ORAL_TABLET | Freq: Every day | ORAL | 0 refills | Status: AC

## 2021-12-28 MED ORDER — APIXABAN 5 MG PO TABS: 5.0000 mg | ORAL_TABLET | Freq: Two times a day (BID) | ORAL | Status: DC

## 2021-12-28 MED ORDER — HEPARIN (PORCINE) 25000 UT/250ML-% IV SOLN
1400.0000 [IU]/h | INTRAVENOUS | Status: DC
  Filled 2021-12-28: qty 250

## 2021-12-28 MED ORDER — RIVAROXABAN 15 MG PO TABS
15.0000 mg | ORAL_TABLET | Freq: Two times a day (BID) | ORAL | Status: DC
  Administered 2021-12-28: 15 mg via ORAL
  Filled 2021-12-28: qty 1

## 2021-12-28 MED ORDER — RIVAROXABAN 15 MG PO TABS: 15.0000 mg | ORAL_TABLET | Freq: Two times a day (BID) | ORAL | 0 refills | Status: DC

## 2021-12-28 MED ORDER — RIVAROXABAN 20 MG PO TABS: 20.0000 mg | ORAL_TABLET | Freq: Every day | ORAL | Status: DC

## 2021-12-28 MED ORDER — AMLODIPINE BESYLATE 5 MG PO TABS: 5.0000 mg | ORAL_TABLET | Freq: Every day | ORAL | 0 refills | Status: DC

## 2021-12-28 NOTE — Discharge Summary (Addendum)
Physician Discharge Summary  ?LARRY ALCOCK MEQ:[id number] DOB: Jun 09, 1946 DOA: 12/24/2021 ? ?PCP: Mayra Neer, MD ? ?Admit date: 12/24/2021 ?Discharge date: 12/28/2021 ? ?Admitted From: Home ?Disposition:  Home ? ?Recommendations for Outpatient Follow-up:  ?Follow up with Urology in 8 weeks ?Please obtain BMP/CBC in one week ? ? ?Home Health:Yes ?Equipment/Devices:None ? ?Discharge Condition:Stable ?CODE STATUS:Full ?Diet recommendation: Heart Healthy ? ?Brief/Interim Summary: ?76 y.o. male past medical history of hypertension obstructive sleep apnea comes in on 12/23/2021 for dizziness CT angio of the chest showed PE and bilateral renal masses about 7 cm on the right 2.5 cm on the left started on IV heparin pulmonary was consulted as well as IR patient received catheter directed tPA given the size of PE and right heart strain. ? ?Discharge Diagnoses:  ?Principal Problem: ?  Pulmonary embolism ([place]) ?Active Problems: ?  Bilateral renal masses ? ?Acute pulmonary embolism: ?CT angio of the chest showed massive PE IR was consulted to perform diuretic thrombolytic therapy. ?PCCM admitted the patient started him on a heparin he was kept on heparin for 48 hours and remained stable he was changed to oral Xarelto which she will continue as an outpatient. ? ?Incidental bilateral renal mass: ?Seen on CT angio of the chest to rule out a PE and was an incidental finding. ?Patient will follow up with urology for further work-up. ? ?History of gout: ?No changes made to his medication. ? ?Results of sleep apnea: ?Continue CPAP at night. ? ?Elevated troponins: ?Likely demand ischemia in the setting of PE. ? ?Obesity  : ?counseling ? ?Discharge Instructions ? ?Discharge Instructions   ? ? Diet - low sodium heart healthy   Complete by: As directed ?  ? Increase activity slowly   Complete by: As directed ?  ? ?  ? ?Allergies as of 12/28/2021   ? ?   Reactions  ? Penicillins   ? Did it involve swelling of the face/tongue/throat, SOB, or low  BP? Yes ?Did it involve sudden or severe rash/hives, skin peeling, or any reaction on the inside of your mouth or nose? Yes ?Did you need to seek medical attention at a hospital or doctor's office? No ?When did it last happen?    childhood   ?If all above answers are "NO", may proceed with cephalosporin use.  ? Prednisone   ? Constipation  ? ?  ? ?  ?Medication List  ?  ? ?TAKE these medications   ? ?allopurinol 300 MG tablet ?Commonly known as: ZYLOPRIM ?Take 150 mg by mouth daily. ?  ?amLODipine 5 MG tablet ?Commonly known as: NORVASC ?Take 1 tablet (5 mg total) by mouth daily. ?  ?aspirin EC 81 MG tablet ?Take 81 mg by mouth once a week. Swallow whole. ?  ?lisinopril 10 MG tablet ?Commonly known as: ZESTRIL ?Take 10 mg by mouth daily. ?  ?Rivaroxaban 15 MG Tabs tablet ?Commonly known as: XARELTO ?Take 1 tablet (15 mg total) by mouth 2 (two) times daily with a meal. ?  ?rivaroxaban 20 MG Tabs tablet ?Commonly known as: XARELTO ?Take 1 tablet (20 mg total) by mouth daily with supper. ?Start taking on: Jan 18, 2022 ?  ?traZODone 100 MG tablet ?Commonly known as: DESYREL ?Take 100 mg by mouth at bedtime as needed for sleep. ?  ? ?  ? ? ?Allergies  ?Allergen Reactions  ? Penicillins   ?  Did it involve swelling of the face/tongue/throat, SOB, or low BP? Yes ?Did it involve sudden or severe rash/hives, skin  peeling, or any reaction on the inside of your mouth or nose? Yes ?Did you need to seek medical attention at a hospital or doctor's office? No ?When did it last happen?    childhood   ?If all above answers are "NO", may proceed with cephalosporin use. ? ?  ? Prednisone   ?  Constipation  ? ? ?Consultations: ?Pulmonary and critical care ?Interventional radiology ? ? ?Procedures/Studies: ?DG Chest 2 View ? ?Result Date: 12/24/2021 ?CLINICAL DATA:  Shortness of breath.  Near-syncope. EXAM: CHEST - 2 VIEW COMPARISON:  Chest two views 11/21/2010 FINDINGS: Cardiac silhouette and mediastinal contours are within normal  limits. The lungs are clear. Resolution of the prior bilateral costophrenic angle mild heterogeneous airspace opacities on prior remote 2012 radiographs. No pleural effusion or pneumothorax. Mild dextrocurvature of the thoracic spine. Mild multilevel degenerative disc changes of the thoracic spine. Severe left acromioclavicular joint space narrowing, unchanged. IMPRESSION: No active cardiopulmonary disease. Electronically Signed   By: Yvonne Kendall M.D.   On: 12/24/2021 12:57  ? ?CT HEAD W & WO CONTRAST (5MM) ? 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 RADIATION DOSE REDUCTION: This exam was performed according to the departmental dose-optimization program which includes automated exposure control, adjustment of the mA and/or kV according to patient size and/or use of iterative reconstruction technique. CONTRAST:  52m OMNIPAQUE IOHEXOL 300 MG/ML  SOLN COMPARISON:  None Available. FINDINGS: Brain: There is no mass, hemorrhage or extra-axial collection. The size and configuration of the ventricles and extra-axial CSF spaces are normal. The brain parenchyma is normal, without acute or chronic infarction. No abnormal enhancement. Vascular: No abnormal hyperdensity of the major intracranial arteries or dural venous sinuses. No intracranial atherosclerosis. Skull: The visualized skull base, calvarium and extracranial soft tissues are normal. Sinuses/Orbits: No fluid levels or advanced mucosal thickening of the visualized paranasal sinuses. No mastoid or middle ear effusion. The orbits are normal. IMPRESSION: Normal head CT. Electronically Signed   By: KUlyses JarredM.D.   On: 12/24/2021 20:12  ? ?IR Angiogram Pulmonary Bilateral Selective ? ?Result Date: 12/25/2021 ?INDICATION: 76year old male with submassive pulmonary  embolism presents for catheter directed thrombolysis EXAM: ULTRASOUND-GUIDED ACCESS RIGHT COMMON FEMORAL VEIN ULTRASOUND-GUIDED ACCESS RIGHT COMMON FEMORAL VEIN PULMONARY ANGIOGRAM PLACEMENT OF BILATERAL LYTIC CATHETERS FOR INITIATION CATHETER DIRECTED THROMBOLYSIS COMPARISON:  CT 12/24/2021 MEDICATIONS: None. ANESTHESIA/SEDATION: Moderate (conscious) sedation was employed during this procedure. A total of Versed 0.5 mg and Fentanyl 25 mcg was administered intravenously by the radiology nurse. Total intra-service moderate Sedation Time: 50 minutes. The patient's level of consciousness and vital signs were monitored continuously by radiology nursing throughout the procedure under my direct supervision. FLUOROSCOPY: Radiation Exposure Index (as provided by the fluoroscopic device): 1300mGy K[place] COMPLICATIONS: None TECHNIQUE: Informed consent was obtained from the patient and the patient's family following explanation of the procedure, risks, benefits and alternatives. Specific risks include bleeding, infection, contrast reaction, kidney injury, venous injury, life-threatening hemorrhage including brain hemorrhage, gastrointestinal hemorrhage, epistaxis, need for further surgery, need for further procedure, cardiopulmonary collapse, death. The patient understands, agrees and consents for the procedure. All questions were addressed. Patient is position supine position on the fluoroscopy table. Maximal barrier sterile technique utilized including caps, mask, sterile gowns, sterile gloves, large sterile drape, hand hygiene, and chlorhexidine prep. 1% lidocaine used for local anesthesia. Moderate sedation was  provided. Ultrasound survey of the right inguinal region was performed with images stored and sent to PACs, confirming patency of the right common femoral vein. A single wall needle was used access the right common femoral vein under ultrasound. With excellent blood flow returned, 035 wire was passed through the  needle, observed to enter the IVC under fluoroscopy. The needle was removed, and a standard 6 [place] vascular sheath was placed. The dilator was removed and the sheath was flushed. Single wall needle was again used acc

## 2021-12-28 NOTE — TOC CM/SW Note (Signed)
?  Transition of Care (TOC) Screening Note ? ? ?Patient Details  ?Name: Stanley Juarez ?Date of Birth: August 29, 1945 ? ? ?Transition of Care (TOC) CM/SW Contact:    ?Ross Ludwig, LCSW ?Phone Number: ?12/28/2021, 1:28 PM ? ? ? ?Transition of Care Department [facility]) has reviewed patient and no TOC needs have been identified at this time. We will continue to monitor patient advancement through interdisciplinary progression rounds. If new patient transition needs arise, please place a TOC consult. ?  ?

## 2021-12-28 NOTE — Evaluation (Signed)
Physical Therapy Evaluation ?Patient Details ?Name: Stanley Juarez ?MRN: [id number] ?DOB: 09-03-1945 ?Today's Date: 12/28/2021 ? ?History of Present Illness ? 76 y.o. 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  ?Clinical Impression ? Pt scheduled to d/c today.  He was able to ascend/descend flight of stairs and was educated on home safety and easing back into mobility and given HEP for exercises and lap walking at home.  Will sign off.   ?   ? ?Recommendations for follow up therapy are one component of a multi-disciplinary discharge planning process, led by the attending physician.  Recommendations may be updated based on patient status, additional functional criteria and insurance authorization. ? ?Follow Up Recommendations No PT follow up ? ?  ?Assistance Recommended at Discharge PRN  ?Patient can return home with the following ?   ? ?  ?Equipment Recommendations None recommended by PT  ?Recommendations for Other Services ?    ?  ?Functional Status Assessment Patient has had a recent decline in their functional status and demonstrates the ability to make significant improvements in function in a reasonable and predictable amount of time.  ? ?  ?Precautions / Restrictions Precautions ?Precautions: None ?Restrictions ?Weight Bearing Restrictions: No  ? ?  ? ?Mobility ? Bed Mobility ?Overal bed mobility: Independent ?  ?  ?  ?  ?  ?  ?  ?  ? ?Transfers ?Overall transfer level: Modified independent ?  ?  ?  ?  ?  ?  ?  ?  ?  ?  ? ?Ambulation/Gait ?Ambulation/Gait assistance: Supervision ?Gait Distance (Feet): 500 Feet ?Assistive device: None ?Gait Pattern/deviations: Step-through pattern ?Gait velocity: WNL ?  ?  ?General Gait Details: o2 98% on RA.  No LOB with sudden stop or head turns or head nods. ? ?Stairs ?Stairs: Yes ?Stairs assistance: Supervision ?Stair  Management: One rail Right, Step to pattern ?Number of Stairs: 10 ?General stair comments: demonstrated good safety ? ?Wheelchair Mobility ?  ? ?Modified Rankin (Stroke Patients Only) ?  ? ?  ? ?Balance Overall balance assessment: No apparent balance deficits (not formally assessed) ?  ?  ?  ?  ?  ?  ?  ?  ?  ?  ?  ?  ?  ?  ?  ?  ?  ?  ?   ? ? ? ?Pertinent Vitals/Pain Pain Assessment ?Pain Assessment: No/denies pain  ? ? ?Home Living Family/patient expects to be discharged to:: Private residence ?Living Arrangements: Spouse/significant other ?Available Help at Discharge: Family ?Type of Home: House ?Home Access: Stairs to enter ?  ?Entrance Stairs-Number of Steps: 2 ?  ?Home Layout: Two level ?Home Equipment: None ?   ?  ?Prior Function Prior Level of Function : Independent/Modified Independent ?  ?  ?  ?  ?  ?  ?  ?  ?  ? ? ?Hand Dominance  ?   ? ?  ?Extremity/Trunk Assessment  ? Upper Extremity Assessment ?Upper Extremity Assessment: Overall WFL for tasks assessed ?  ? ?Lower Extremity Assessment ?Lower Extremity Assessment: Overall WFL for tasks assessed ?  ? ?   ?Communication  ? Communication: No difficulties  ?Cognition Arousal/Alertness: Awake/alert ?Behavior During Therapy: [facility] for tasks assessed/performed ?Overall Cognitive Status: Within Functional Limits for tasks assessed ?  ?  ?  ?  ?  ?  ?  ?  ?  ?  ?  ?  ?  ?  ?  ?  ?  ?  ?  ? ?  ?  General Comments General comments (skin integrity, edema, etc.): instructed in seated LE HEP and discussed importance of not falling due to being on blood thinner ? ?  ?Exercises    ? ?Assessment/Plan  ?  ?PT Assessment Patient does not need any further PT services  ?PT Problem List   ? ?   ?  ?PT Treatment Interventions     ? ?PT Goals (Current goals can be found in the Care Plan section)  ?Acute Rehab PT Goals ?PT Goal Formulation: All assessment and education complete, DC therapy ? ?  ?Frequency   ?  ? ? ?Co-evaluation   ?  ?  ?  ?  ? ? ?  ?AM-PAC PT "6 Clicks" Mobility   ?Outcome Measure Help needed turning from your back to your side while in a flat bed without using bedrails?: None ?Help needed moving from lying on your back to sitting on the side of a flat bed without using bedrails?: None ?Help needed moving to and from a bed to a chair (including a wheelchair)?: None ?Help needed standing up from a chair using your arms (e.g., wheelchair or bedside chair)?: None ?Help needed to walk in hospital room?: None ?Help needed climbing 3-5 steps with a railing? : None ?6 Click Score: 24 ? ?  ?End of Session Equipment Utilized During Treatment: Gait belt ?Activity Tolerance: Patient tolerated treatment well ?Patient left: in bed;with family/visitor present ?Nurse Communication: Mobility status ?PT Visit Diagnosis: Difficulty in walking, not elsewhere classified (R26.2) ?  ? ?Time: 2376-2831 ?PT Time Calculation (min) (ACUTE ONLY): 17 min ? ? ?Charges:   PT Evaluation ?$PT Eval Low Complexity: 1 Low ?  ?  ?   ? ? ?Santiago Glad L. Tamala Julian, PT ? ?12/28/2021 ? ? ?Galen Manila ?12/28/2021, 1:49 PM ? ?

## 2021-12-28 NOTE — Progress Notes (Signed)
Pt discharged to home with wife. Discharge instructions and medication education provided to pt.  

## 2021-12-28 NOTE — Progress Notes (Addendum)
ANTICOAGULATION CONSULT NOTE - follow up ? ?Pharmacy Consult for IV heparin ?Indication: pulmonary embolus ? ?Allergies  ?Allergen Reactions  ? Penicillins   ?  Did it involve swelling of the face/tongue/throat, SOB, or low BP? Yes ?Did it involve sudden or severe rash/hives, skin peeling, or any reaction on the inside of your mouth or nose? Yes ?Did you need to seek medical attention at a hospital or doctor's office? No ?When did it last happen?    childhood   ?If all above answers are "NO", may proceed with cephalosporin use. ? ?  ? Prednisone   ?  Constipation  ? ? ?Patient Measurements: ?Height: '5\' 10"'$  (177.8 cm) ?Weight: 107.4 kg (236 lb 12.4 oz) ?IBW/kg (Calculated) : 73 ?Heparin Dosing Weight: 97 kg ? ?Vital Signs: ?Temp: 98.1 ?F (36.7 ?C) (05/06 8325) ?Temp Source: Oral (05/06 0504) ?BP: 142/83 (05/06 0504) ?Pulse Rate: 65 (05/06 0504) ? ?Labs: ?Recent Labs  ?  12/26/21 ?0308 12/26/21 ?1036 12/26/21 ?1343 12/27/21 ?4982 12/28/21 ?6415  ?HGB 15.6 15.2  --  14.4 14.7  ?HCT 48.2 45.5  --  44.1 44.5  ?PLT 126* 127*  --  141* 165  ?HEPARINUNFRC 0.64  --  0.60 0.58 1.04*  ?CREATININE 1.11 1.09  --   --   --   ?TROPONINIHS  --   --   --  69*  --   ? ? ? ?Estimated Creatinine Clearance: 73 mL/min (by C-G formula based on SCr of 1.09 mg/dL). ? ? ?Medical History: ?Past Medical History:  ?Diagnosis Date  ? Cancer [facility])   ? Sleep apnea   ? ? ?Assessment: ?51 y/oM who [facility] ED with acute shortness of breath and abdominal pain. High sensitivity troponin 788 > 670. CTa + for large saddle PE with evidence of right heart strain. Pharmacy consulted for IV heparin dosing for PE. Patient not on anticoagulants PTA. CBC WNL.  ? ?Significant Events ?- 5/3 @ 0005 --> catheter directed tPA instilled in IR ?- Lytic therapy completed, both R femoral sheaths removed around ~1245.  ?- 5/4 Dopplers + for RLE acute DVT ? ?12/28/2021   ?- Heparin level supratherapeutic (1.04) 1650 units/hr - confirmed that level was drawn in  opposite hand that heparin is infusing ?- Hgb 14.7, plts 165 stable ?- No bleeding noted per RN.   ? ? ?Goal of Therapy:  ?Heparin level 0.3-0.7 units/ml ?Monitor platelets by anticoagulation protocol: Yes ?  ?Plan:  ?Decrease heparin drip to 1400 units/hr ?Recheck heparin level in 8hr ?Daily heparin level and CBC ?Monitor closely for s/sx of bleeding ?F/u for transition to [place] ? ? ?Peggyann Juba, PharmD, BCPS ?Pharmacy: [phone] ?12/28/2021 7:10 AM ? ?Addendum: ?D/C heparin ?Begin Eliquis '10mg'$  BID x 7 days, then '5mg'$  BID thereafter ?Will provide 30 day free coupon voucher prior to discharge. ? ?Peggyann Juba, PharmD, BCPS ?12/28/2021 9:00 AM ? ?Addendum #2: ?Change to Xarelto instead of Eliquis ?Xarelto '15mg'$  BID x 21 days, then '20mg'$  daily with supper thereafter ?Will provide 30 day free coupon voucher prior to discharge. ? ?Peggyann Juba, PharmD, BCPS ?12/28/2021 9:38 AM ? ? ? ? ?

## 2021-12-28 NOTE — Discharge Instructions (Signed)
Information on my medicine - XARELTO? (rivaroxaban) ? ?This medication education was reviewed with me or my healthcare representative as part of my discharge preparation.  ? ?WHY WAS XARELTO? PRESCRIBED FOR YOU? ?Xarelto? was prescribed to treat blood clots that may have been found in the veins of your legs (deep vein thrombosis) or in your lungs (pulmonary embolism) and to reduce the risk of them occurring again. ? ?What do you need to know about Xarelto?? ?The starting dose is one 15 mg tablet taken TWICE daily with food for the FIRST 21 DAYS then on 01/18/2022  the dose is changed to one 20 mg tablet taken ONCE A DAY with your evening meal. ? ?DO NOT stop taking Xarelto? without talking to the health care provider who prescribed the medication.  Refill your prescription for 20 mg tablets before you run out. ? ?After discharge, you should have regular check-up appointments with your healthcare provider that is prescribing your Xarelto?Marland Kitchen  In the future your dose may need to be changed if your kidney function changes by a significant amount. ? ?What do you do if you miss a dose? ?If you are taking Xarelto? TWICE DAILY and you miss a dose, take it as soon as you remember. You may take two 15 mg tablets (total 30 mg) at the same time then resume your regularly scheduled 15 mg twice daily the next day. ? ?If you are taking Xarelto? ONCE DAILY and you miss a dose, take it as soon as you remember on the same day then continue your regularly scheduled once daily regimen the next day. Do not take two doses of Xarelto? at the same time.  ? ?Important Safety Information ?Xarelto? is a blood thinner medicine that can cause bleeding. You should call your healthcare provider right away if you experience any of the following: ?Bleeding from an injury or your nose that does not stop. ?Unusual colored urine (red or dark brown) or unusual colored stools (red or black). ?Unusual bruising for unknown reasons. ?A serious fall or if you  hit your head (even if there is no bleeding). ? ?Some medicines may interact with Xarelto? and might increase your risk of bleeding while on Xarelto?Marland Kitchen To help avoid this, consult your healthcare provider or pharmacist prior to using any new prescription or non-prescription medications, including herbals, vitamins, non-steroidal anti-inflammatory drugs (NSAIDs) and supplements. ? ?This website has more information on Xarelto?: [email or web address].[email or web address]/.  ?

## 2021-12-30 ENCOUNTER — Other Ambulatory Visit: Payer: Medicare HMO

## 2021-12-30 ENCOUNTER — Ambulatory Visit (INDEPENDENT_AMBULATORY_CARE_PROVIDER_SITE_OTHER)
Admission: RE | Admit: 2021-12-30 | Discharge: 2021-12-30 | Disposition: A | Discharge status: Home / Self Care | Payer: Medicare HMO | Source: Ambulatory Visit | Attending: Primary Care | Admitting: Primary Care

## 2021-12-30 ENCOUNTER — Encounter: Payer: Self-pay | Admitting: Primary Care

## 2021-12-30 ENCOUNTER — Ambulatory Visit: Payer: Medicare HMO

## 2021-12-30 ENCOUNTER — Ambulatory Visit: Payer: Medicare HMO | Admitting: Primary Care

## 2021-12-30 ENCOUNTER — Telehealth: Payer: Self-pay | Admitting: Primary Care

## 2021-12-30 VITALS — BP 130/80 | HR 71 | Temp 97.9°F | Ht 70.0 in | Wt 233.4 lb

## 2021-12-30 DIAGNOSIS — I824Z1 Acute embolism and thrombosis of unspecified deep veins of right distal lower extremity: Secondary | ICD-10-CM | POA: Diagnosis not present

## 2021-12-30 DIAGNOSIS — I2602 Saddle embolus of pulmonary artery with acute cor pulmonale: Secondary | ICD-10-CM

## 2021-12-30 DIAGNOSIS — R0602 Shortness of breath: Secondary | ICD-10-CM

## 2021-12-30 DIAGNOSIS — R0600 Dyspnea, unspecified: Secondary | ICD-10-CM | POA: Diagnosis not present

## 2021-12-30 DIAGNOSIS — N2889 Other specified disorders of kidney and ureter: Secondary | ICD-10-CM | POA: Diagnosis not present

## 2021-12-30 DIAGNOSIS — M542 Cervicalgia: Secondary | ICD-10-CM

## 2021-12-30 MED ORDER — CYCLOBENZAPRINE HCL 5 MG PO TABS: 5.0000 mg | ORAL_TABLET | Freq: Three times a day (TID) | ORAL | 0 refills | Status: DC | PRN

## 2021-12-30 NOTE — Patient Instructions (Addendum)
Recommendations: ?- Elevate right leg when sitting, use heating pack and wear compression stockings to help reduce pain and swelling  ?- Try extra strength tylenol every 6 hours as needed for pain (do not take NSAIDS such as aleve, naproxen) ?- Ok to take flexeril (muscle relaxer) every 8 hours as needed for neck pain (do not exceed 5 days). Do not drive while taking and combine with alcohol. Do simple neck range of motion exercises. If pain does not improving please see PCP.  ? ?Orders: ?- Simple walk test today to assess for O2 need ?- CXR and labs (ordered) ? ?Rx: ?- Flexeril '5mg'$  q8 hours  ? ?Follow-up: ?- Keep apt in July with Dr. Silas Flood (ok to cancel ap on 2/26 with me/duplicate) ?

## 2021-12-30 NOTE — Progress Notes (Signed)
$'@Patient'K$  ID: Stanley Juarez, male    DOB: [birth year], 76 y.o.   MRN: [id number]  Chief Complaint  Patient presents with   Hospitalization Follow-up    Pt states he was doing better after recent hospitalization. States he still has some soreness in right leg, has a crick in his neck on left side that he cannot get to go away, is a little SOB, and also has noticed some swelling in right foot.    Referring provider: Mayra Neer, MD  HPI: 76 year old male, never smoked. PMH significant for pulmonary embolism, sleep apnea, renal masses.   12/30/2021 Patient presents today for hospital follow-up. He was admitted from 12/24/21-12/28/21 for acute submassive pulmonary embolism. He developed shortness of breath on 12/23/21 along with dizziness. He was moving wood at the time and felt discomfort in lower chest radiating towards his abdomen. CTA on the chest showed massive PE, IR consulted to perform diuretic thrombolytic therapy. He was started on hep gtt which was changed to oral Xarelto. He also had incidental bilateral renal masses on CT imaging, needs follow-up with urology outpatient. Venous dopplers showed RLE DVT right femoral vein. Complains of right leg swelling/pain and neck pain. He worked with physical therapy 2 days ago right before being discharged, states that he was told to move neck from side to side while walking. His neck started aching the following day. He has been walking more at home and is a litle more active then when he was in the hospital. He does recall hitting the side of his right leg prior to developing lower extremity blood clot. He is compliant with blood thinner. He has been wearing CPAP.   Allergies  Allergen Reactions   Penicillins     Did it involve swelling of the face/tongue/throat, SOB, or low BP? Yes Did it involve sudden or severe rash/hives, skin peeling, or any reaction on the inside of your mouth or nose? Yes Did you need to seek medical attention at a hospital  or doctor's office? No When did it last happen?    childhood   If all above answers are "NO", may proceed with cephalosporin use.     Prednisone     Constipation    Immunization History  Administered Date(s) Administered   Moderna Sars-Covid-2 Vaccination 10/07/2019, 11/02/2019   Pneumococcal Conjugate-13 08/03/2013   Pneumococcal Polysaccharide-23 08/28/2014   Td 08/25/1997   Tdap 07/26/2012   Zoster, Live 07/26/2012    Past Medical History:  Diagnosis Date   Cancer ([place])    Sleep apnea     Tobacco History: Social History   Tobacco Use  Smoking Status Never  Smokeless Tobacco Never   Counseling given: Not Answered   Outpatient Medications Prior to Visit  Medication Sig Dispense Refill   allopurinol (ZYLOPRIM) 300 MG tablet Take 150 mg by mouth daily.     amLODipine (NORVASC) 5 MG tablet Take 1 tablet (5 mg total) by mouth daily. 30 tablet 0   aspirin EC 81 MG tablet Take 81 mg by mouth once a week. Swallow whole.     lisinopril (ZESTRIL) 10 MG tablet Take 10 mg by mouth daily.     Rivaroxaban (XARELTO) 15 MG TABS tablet Take 1 tablet (15 mg total) by mouth 2 (two) times daily with a meal. 42 tablet 0   rivaroxaban (XARELTO) 20 MG TABS tablet Take 1 tablet (20 mg total) by mouth daily with supper. 30 tablet 0   traZODone (DESYREL) 100 MG tablet Take  100 mg by mouth at bedtime as needed for sleep.     No facility-administered medications prior to visit.   Review of Systems  Review of Systems  Constitutional: Negative.   HENT: Negative.    Respiratory: Negative.    Cardiovascular:  Positive for leg swelling.  Musculoskeletal:  Positive for neck pain.    Physical Exam  BP 130/80 (BP Location: Left Arm, Patient Position: Sitting, Cuff Size: Normal)   Pulse 71   Temp 97.9 F (36.6 C) (Oral)   Ht '5\' 10"'$  (1.778 m)   Wt 233 lb 6.4 oz (105.9 kg)   SpO2 97% Comment: RA  BMI 33.49 kg/m  Physical Exam Cardiovascular:     Rate and Rhythm: Regular rhythm.      Comments: +RLE/knee edema. Varicose veins Pulmonary:     Effort: Pulmonary effort is normal. No respiratory distress.     Breath sounds: Wheezing present. No rhonchi.     Comments: Rales at bases Musculoskeletal:        General: Swelling present.     Comments: Decreased lateral range of motion to neck. No ecchymosis or swelling.   Skin:    General: Skin is warm and dry.  Neurological:     General: No focal deficit present.     Mental Status: He is oriented to person, place, and time. Mental status is at baseline.  Psychiatric:        Mood and Affect: Mood normal.        Behavior: Behavior normal.        Thought Content: Thought content normal.        Judgment: Judgment normal.     Lab Results:  CBC    Component Value Date/Time   WBC 6.0 12/28/2021 0525   RBC 4.84 12/28/2021 0525   HGB 14.7 12/28/2021 0525   HCT 44.5 12/28/2021 0525   PLT 165 12/28/2021 0525   MCV 91.9 12/28/2021 0525   MCH 30.4 12/28/2021 0525   MCHC 33.0 12/28/2021 0525   RDW 13.2 12/28/2021 0525    BMET    Component Value Date/Time   NA 138 12/26/2021 1036   K 3.8 12/26/2021 1036   CL 111 12/26/2021 1036   CO2 22 12/26/2021 1036   GLUCOSE 121 (H) 12/26/2021 1036   BUN 16 12/26/2021 1036   CREATININE 1.09 12/26/2021 1036   CALCIUM 8.3 (L) 12/26/2021 1036   GFRNONAA >60 12/26/2021 1036    BNP    Component Value Date/Time   BNP 72.3 12/27/2021 0524    ProBNP No results found for: PROBNP  Imaging: DG Chest 2 View  Result Date: 12/30/2021 CLINICAL DATA:  Recently hospitalized for PE, dyspnea EXAM: CHEST - 2 VIEW COMPARISON:  Dec 24, 2021. FINDINGS: No confluent consolidation. Mild streaky left lateral basilar opacity. No visible pleural effusions or pneumothorax. Cardiomediastinal silhouette is similar to prior. No displaced fracture. IMPRESSION: Mild streaky left lateral basilar opacity, favor atelectasis. Electronically Signed   By: Margaretha Sheffield M.D.   On: 12/30/2021 15:50   DG Chest  2 View  Result Date: 12/24/2021 CLINICAL DATA:  Shortness of breath.  Near-syncope. EXAM: CHEST - 2 VIEW COMPARISON:  Chest two views 11/21/2010 FINDINGS: Cardiac silhouette and mediastinal contours are within normal limits. The lungs are clear. Resolution of the prior bilateral costophrenic angle mild heterogeneous airspace opacities on prior remote 2012 radiographs. No pleural effusion or pneumothorax. Mild dextrocurvature of the thoracic spine. Mild multilevel degenerative disc changes of the thoracic spine. Severe left acromioclavicular  joint space narrowing, unchanged. IMPRESSION: No active cardiopulmonary disease. Electronically Signed   By: Yvonne Kendall M.D.   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 RADIATION DOSE REDUCTION: This exam was performed according to the departmental dose-optimization program which includes automated exposure control, adjustment of the mA and/or kV according to patient size and/or use of iterative reconstruction technique. CONTRAST:  32m OMNIPAQUE IOHEXOL 300 MG/ML  SOLN COMPARISON:  None Available. FINDINGS: Brain: There is no mass, hemorrhage or extra-axial collection. The size and configuration of the ventricles and extra-axial CSF spaces are normal. The brain parenchyma is normal, without acute or chronic infarction. No abnormal enhancement. Vascular: No abnormal hyperdensity of the major intracranial arteries or dural venous sinuses. No intracranial atherosclerosis. Skull: The visualized skull base, calvarium and extracranial soft tissues are normal. Sinuses/Orbits: No fluid levels or advanced mucosal thickening of the visualized paranasal sinuses. No mastoid or middle ear effusion. The orbits are normal. IMPRESSION:  Normal head CT. Electronically Signed   By: KUlyses JarredM.D.   On: 12/24/2021 20:12   IR Angiogram Pulmonary Bilateral Selective  Result Date: 12/25/2021 INDICATION: 76year old male with submassive pulmonary embolism presents for catheter directed thrombolysis EXAM: ULTRASOUND-GUIDED ACCESS RIGHT COMMON FEMORAL VEIN ULTRASOUND-GUIDED ACCESS RIGHT COMMON FEMORAL VEIN PULMONARY ANGIOGRAM PLACEMENT OF BILATERAL LYTIC CATHETERS FOR INITIATION CATHETER DIRECTED THROMBOLYSIS COMPARISON:  CT 12/24/2021 MEDICATIONS: None. ANESTHESIA/SEDATION: Moderate (conscious) sedation was employed during this procedure. A total of Versed 0.5 mg and Fentanyl 25 mcg was administered intravenously by the radiology nurse. Total intra-service moderate Sedation Time: 50 minutes. The patient's level of consciousness and vital signs were monitored continuously by radiology nursing throughout the procedure under my direct supervision. FLUOROSCOPY: Radiation Exposure Index (as provided by the fluoroscopic device): 1175mGy Kerma COMPLICATIONS: None TECHNIQUE: Informed consent was obtained from the patient and the patient's family following explanation of the procedure, risks, benefits and alternatives. Specific risks include bleeding, infection, contrast reaction, kidney injury, venous injury, life-threatening hemorrhage including brain hemorrhage, gastrointestinal hemorrhage, epistaxis, need for further surgery, need for further procedure, cardiopulmonary collapse, death. The patient understands, agrees and consents for the procedure. All questions were addressed. Patient is position supine position on the fluoroscopy table. Maximal barrier sterile technique utilized including caps, mask, sterile gowns, sterile gloves, large sterile drape, hand hygiene, and chlorhexidine prep. 1% lidocaine used for local anesthesia. Moderate sedation was provided. Ultrasound survey of the right inguinal region was performed with images stored and sent to  PACs, confirming patency of the right common femoral vein. A single wall needle was used access the right common femoral vein under ultrasound. With excellent blood flow returned, 035 wire was passed through the needle, observed to enter the IVC under fluoroscopy. The needle was removed, and a standard 6 [place] sheath was placed. The dilator was removed and the sheath was flushed. Single wall needle was again used access the right common femoral vein under ultrasound, adjacent to the initial puncture. With excellent blood flow returned, 035 wire was passed through the needle, observed to enter the IVC under fluoroscopy. The needle was removed, and a standard 6 [place] sheath was placed. The dilator was removed and the sheath was flushed. Combination of a angled pigtail catheter and a Bentson wire was then  used to navigate to the right heart. Angled catheter used to select the main pulmonary artery. Wire was removed and angiogram was performed. Pressure measurement was obtained. Stiff Glidewire was then advanced through the pigtail catheter, reducing the curve and flipping into the right-sided pulmonary artery. The wire was straightened into the downgoing branches of the right pulmonary artery. Pigtail catheter was then removed and a 105 cm infusion length 90cm working length UniFuse catheter was placed. Small contrast infusion confirmed position. Catheter was flushed. The alternative sheath was then used for selection of the contralateral pulmonary artery. Pigtail catheter was advanced into the right heart and used to select the main pulmonary artery. Again the exchange stiff Glidewire was used to reduce the curved and removed the pigtail catheter. A vertebral shaped 100 cm diagnostic catheter was then used to select the left-sided pulmonary arteries. Once the catheter was positioned into the lower lobar branches, contrast confirmed location. A 15 cm infusion length 90cm working length UniFuse  catheter was placed. Wire was removed, small amount of contrast confirmed position and catheter was flushed. The obturator wires were then placed through both the left and right catheters. Sheaths were secured in position.  Final image was stored. The patient tolerated the procedure well and remained hemodynamically stable throughout. No complications were encountered and no significant blood loss was encountered. FINDINGS: Ultrasound survey demonstrates patent right common femoral vein. Main pulmonary artery pressure measures 41/14 (24). Final image demonstrates right-sided catheter directed into lower lobar branches and left sided catheter directed into lower lobar branches. The rightward sheath transmits the left-sided catheter. The leftward sheath transmits the right-sided pulmonary artery catheter. IMPRESSION: Status post US guided right CFV access and placement of left and right pulmonary artery lytic catheters for initiation of catheter directed thrombolysis. Signed, Dulcy Fanny. Earleen Newport, DO [facility] Radiology Electronically Signed   By: Corrie Mckusick D.O.   On: 12/25/2021 08:34   IR US Guide Vasc Access Right  Result Date: 12/25/2021 INDICATION: 76 year old male with submassive pulmonary embolism presents for catheter directed thrombolysis EXAM: ULTRASOUND-GUIDED ACCESS RIGHT COMMON FEMORAL VEIN ULTRASOUND-GUIDED ACCESS RIGHT COMMON FEMORAL VEIN PULMONARY ANGIOGRAM PLACEMENT OF BILATERAL LYTIC CATHETERS FOR INITIATION CATHETER DIRECTED THROMBOLYSIS COMPARISON:  CT 12/24/2021 MEDICATIONS: None. ANESTHESIA/SEDATION: Moderate (conscious) sedation was employed during this procedure. A total of Versed 0.5 mg and Fentanyl 25 mcg was administered intravenously by the radiology nurse. Total intra-service moderate Sedation Time: 50 minutes. The patient's level of consciousness and vital signs were monitored continuously by radiology nursing throughout the procedure  under my direct supervision. FLUOROSCOPY: Radiation Exposure Index (as provided by the fluoroscopic device): 916 mGy Kerma COMPLICATIONS: None TECHNIQUE: Informed consent was obtained from the patient and the patient's family following explanation of the procedure, risks, benefits and alternatives. Specific risks include bleeding, infection, contrast reaction, kidney injury, venous injury, life-threatening hemorrhage including brain hemorrhage, gastrointestinal hemorrhage, epistaxis, need for further surgery, need for further procedure, cardiopulmonary collapse, death. The patient understands, agrees and consents for the procedure. All questions were addressed. Patient is position supine position on the fluoroscopy table. Maximal barrier sterile technique utilized including caps, mask, sterile gowns, sterile gloves, large sterile drape, hand hygiene, and chlorhexidine prep. 1% lidocaine used for local anesthesia. Moderate sedation was provided. Ultrasound survey of the right inguinal region was performed with images stored and sent to PACs, confirming patency of the right common femoral vein. A single wall needle was used access the right common femoral vein under ultrasound. With excellent blood  flow returned, 035 wire was passed through the needle, observed to enter the IVC under fluoroscopy. The needle was removed, and a standard 6 [place] vascular sheath was placed. The dilator was removed and the sheath was flushed. Single wall needle was again used access the right common femoral vein under ultrasound, adjacent to the initial puncture. With excellent blood flow returned, 035 wire was passed through the needle, observed to enter the IVC under fluoroscopy. The needle was removed, and a standard 6 [place] vascular sheath was placed. The dilator was removed and the sheath was flushed. Combination of a angled pigtail catheter and a Bentson wire was then used to navigate to the right heart. Angled catheter used to  select the main pulmonary artery. Wire was removed and angiogram was performed. Pressure measurement was obtained. Stiff Glidewire was then advanced through the pigtail catheter, reducing the curve and flipping into the right-sided pulmonary artery. The wire was straightened into the downgoing branches of the right pulmonary artery. Pigtail catheter was then removed and a 105 cm infusion length 90cm working length UniFuse catheter was placed. Small contrast infusion confirmed position. Catheter was flushed. The alternative sheath was then used for selection of the contralateral pulmonary artery. Pigtail catheter was advanced into the right heart and used to select the main pulmonary artery. Again the exchange stiff Glidewire was used to reduce the curved and removed the pigtail catheter. A vertebral shaped 100 cm diagnostic catheter was then used to select the left-sided pulmonary arteries. Once the catheter was positioned into the lower lobar branches, contrast confirmed location. A 15 cm infusion length 90cm working length UniFuse catheter was placed. Wire was removed, small amount of contrast confirmed position and catheter was flushed. The obturator wires were then placed through both the left and right catheters. Sheaths were secured in position.  Final image was stored. The patient tolerated the procedure well and remained hemodynamically stable throughout. No complications were encountered and no significant blood loss was encountered. FINDINGS: Ultrasound survey demonstrates patent right common femoral vein. Main pulmonary artery pressure measures 41/14 (24). Final image demonstrates right-sided catheter directed into lower lobar branches and left sided catheter directed into lower lobar branches. The rightward sheath transmits the left-sided catheter. The leftward sheath transmits the right-sided pulmonary artery catheter. IMPRESSION: Status post US guided right CFV access and placement of left and right  pulmonary artery lytic catheters for initiation of catheter directed thrombolysis. Signed, Dulcy Fanny. Earleen Newport, DO [facility] Radiology Electronically Signed   By: Corrie Mckusick D.O.   On: 12/25/2021 08:34   IR US Guide Vasc Access Right  Result Date: 12/25/2021 INDICATION: 76 year old male with submassive pulmonary embolism presents for catheter directed thrombolysis EXAM: ULTRASOUND-GUIDED ACCESS RIGHT COMMON FEMORAL VEIN ULTRASOUND-GUIDED ACCESS RIGHT COMMON FEMORAL VEIN PULMONARY ANGIOGRAM PLACEMENT OF BILATERAL LYTIC CATHETERS FOR INITIATION CATHETER DIRECTED THROMBOLYSIS COMPARISON:  CT 12/24/2021 MEDICATIONS: None. ANESTHESIA/SEDATION: Moderate (conscious) sedation was employed during this procedure. A total of Versed 0.5 mg and Fentanyl 25 mcg was administered intravenously by the radiology nurse. Total intra-service moderate Sedation Time: 50 minutes. The patient's level of consciousness and vital signs were monitored continuously by radiology nursing throughout the procedure under my direct supervision. FLUOROSCOPY: Radiation Exposure Index (as provided by the fluoroscopic device): 696 mGy Kerma COMPLICATIONS: None TECHNIQUE: Informed consent was obtained from the patient and the patient's family following explanation of the procedure, risks, benefits and alternatives. Specific risks include bleeding, infection, contrast reaction, kidney injury, venous injury, life-threatening hemorrhage  including brain hemorrhage, gastrointestinal hemorrhage, epistaxis, need for further surgery, need for further procedure, cardiopulmonary collapse, death. The patient understands, agrees and consents for the procedure. All questions were addressed. Patient is position supine position on the fluoroscopy table. Maximal barrier sterile technique utilized including caps, mask, sterile gowns, sterile gloves, large sterile drape, hand hygiene, and chlorhexidine prep. 1% lidocaine  used for local anesthesia. Moderate sedation was provided. Ultrasound survey of the right inguinal region was performed with images stored and sent to PACs, confirming patency of the right common femoral vein. A single wall needle was used access the right common femoral vein under ultrasound. With excellent blood flow returned, 035 wire was passed through the needle, observed to enter the IVC under fluoroscopy. The needle was removed, and a standard 6 [place] vascular sheath was placed. The dilator was removed and the sheath was flushed. Single wall needle was again used access the right common femoral vein under ultrasound, adjacent to the initial puncture. With excellent blood flow returned, 035 wire was passed through the needle, observed to enter the IVC under fluoroscopy. The needle was removed, and a standard 6 [place] vascular sheath was placed. The dilator was removed and the sheath was flushed. Combination of a angled pigtail catheter and a Bentson wire was then used to navigate to the right heart. Angled catheter used to select the main pulmonary artery. Wire was removed and angiogram was performed. Pressure measurement was obtained. Stiff Glidewire was then advanced through the pigtail catheter, reducing the curve and flipping into the right-sided pulmonary artery. The wire was straightened into the downgoing branches of the right pulmonary artery. Pigtail catheter was then removed and a 105 cm infusion length 90cm working length UniFuse catheter was placed. Small contrast infusion confirmed position. Catheter was flushed. The alternative sheath was then used for selection of the contralateral pulmonary artery. Pigtail catheter was advanced into the right heart and used to select the main pulmonary artery. Again the exchange stiff Glidewire was used to reduce the curved and removed the pigtail catheter. A vertebral shaped 100 cm diagnostic catheter was then used to select the left-sided pulmonary arteries.  Once the catheter was positioned into the lower lobar branches, contrast confirmed location. A 15 cm infusion length 90cm working length UniFuse catheter was placed. Wire was removed, small amount of contrast confirmed position and catheter was flushed. The obturator wires were then placed through both the left and right catheters. Sheaths were secured in position.  Final image was stored. The patient tolerated the procedure well and remained hemodynamically stable throughout. No complications were encountered and no significant blood loss was encountered. FINDINGS: Ultrasound survey demonstrates patent right common femoral vein. Main pulmonary artery pressure measures 41/14 (24). Final image demonstrates right-sided catheter directed into lower lobar branches and left sided catheter directed into lower lobar branches. The rightward sheath transmits the left-sided catheter. The leftward sheath transmits the right-sided pulmonary artery catheter. IMPRESSION: Status post US guided right CFV access and placement of left and right pulmonary artery lytic catheters for initiation of catheter directed thrombolysis. Signed, Dulcy Fanny. Earleen Newport, DO [facility] Radiology Electronically Signed   By: Corrie Mckusick D.O.   On: 12/25/2021 08:34   CT Angio Chest/Abd/Pel for Dissection W and/or Wo Contrast  Result Date: 12/24/2021 CLINICAL DATA:  Acute chest pain radiating to lower abdomen since yesterday, short of breath, near syncope EXAM: CT ANGIOGRAPHY CHEST, ABDOMEN AND PELVIS TECHNIQUE: Non-contrast CT of the chest was initially obtained.  Multidetector CT imaging through the chest, abdomen and pelvis was performed using the standard protocol during bolus administration of intravenous contrast. Multiplanar reconstructed images and MIPs were obtained and reviewed to evaluate the vascular anatomy. RADIATION DOSE REDUCTION: This exam was performed according to the departmental  dose-optimization program which includes automated exposure control, adjustment of the mA and/or kV according to patient size and/or use of iterative reconstruction technique. CONTRAST:  120m OMNIPAQUE IOHEXOL 350 MG/ML SOLN COMPARISON:  12/17/2007 FINDINGS: CTA CHEST FINDINGS Cardiovascular: The thoracic aorta is unremarkable without aneurysm or dissection. There is adequate opacification of the pulmonary vasculature, with large saddle pulmonary embolus identified. There is significant clot burden, with a dilated RV/LV ratio measuring approximately 1.17 consistent with right heart strain. No pericardial effusion.  No significant atherosclerosis. Mediastinum/Nodes: No enlarged mediastinal, hilar, or axillary lymph nodes. Thyroid gland, trachea, and esophagus demonstrate no significant findings. Lungs/Pleura: No acute airspace disease, effusion, or pneumothorax. Central airways are widely patent. Musculoskeletal: No acute or destructive bony lesions. Reconstructed images demonstrate no additional findings. Review of the MIP images confirms the above findings. CTA ABDOMEN AND PELVIS FINDINGS VASCULAR Aorta: Normal caliber aorta without aneurysm, dissection, vasculitis or significant stenosis. Minimal atherosclerosis. Celiac: Patent without evidence of aneurysm, dissection, vasculitis or significant stenosis. SMA: Patent without evidence of aneurysm, dissection, vasculitis or significant stenosis. Renals: Duplicated left renal artery. Bilateral renal arteries are patent without evidence of aneurysm, dissection, vasculitis, fibromuscular dysplasia, or significant stenosis. IMA: Patent without evidence of aneurysm, dissection, vasculitis or significant stenosis. Inflow: Patent without evidence of aneurysm, dissection, vasculitis or significant stenosis. Minimal atherosclerosis. Veins: No obvious venous abnormality within the limitations of this arterial phase study. Review of the MIP images confirms the above findings.  NON-VASCULAR Hepatobiliary: No focal liver abnormality is seen. No gallstones, gallbladder wall thickening, or biliary dilatation. Pancreas: Unremarkable. No pancreatic ductal dilatation or surrounding inflammatory changes. Spleen: Normal in size without focal abnormality. Adrenals/Urinary Tract: There is a heterogeneous solid mass arising from the lower pole right kidney measuring 7.0 x 5.9 x 4.7 cm, consistent with renal cell carcinoma. No extension into the right renal pelvis or right renal vasculature. Significant neovascularization supplying the right renal mass from lower lobar branches of the right renal artery. In addition, there is a 2.4 by 2.5 x 2.2 cm solid mass within the mid left kidney, also suspicious for renal cell carcinoma. This does not involve the left renal hilum or left renal vasculature. The adrenals are unremarkable.  Bladder is grossly normal. Stomach/Bowel: No bowel obstruction or ileus. Diverticulosis of the descending and sigmoid colon without diverticulitis. Normal retrocecal appendix. No bowel wall thickening or inflammatory change. Lymphatic: No pathologic adenopathy within the abdomen or pelvis. Reproductive: Prostate is unremarkable. Other: No free fluid or free intraperitoneal gas. No abdominal wall hernia. Musculoskeletal: No acute or destructive bony lesions. Reconstructed images demonstrate no additional findings. Review of the MIP images confirms the above findings. IMPRESSION: Vascular: 1. Large saddle pulmonary embolus with significant clot burden. Positive for acute PE with CT evidence of right heart strain (RV/LV Ratio = 1.17) consistent with at least submassive (intermediate risk) PE. The presence of right heart strain has been associated with an increased risk of morbidity and mortality. Please refer to the "PE Focused" order set in EPIC. 2. No evidence of thoracoabdominal aortic aneurysm or dissection. 3.  Aortic Atherosclerosis (ICD10-I70.0). Nonvascular: 1. Bilateral  renal masses consistent with renal cell carcinoma, measuring up to 7 cm on the right and 2.5 cm on the left.  2. Distal colonic diverticulosis without diverticulitis. Critical Value/emergent results were called by telephone at the time of interpretation on 12/24/2021 at 5:14 pm to provider MATTHEW TRIFAN , who verbally acknowledged these results. Electronically Signed   By: Randa Ngo M.D.   On: 12/24/2021 17:21   VAS [place] LOWER EXTREMITY VENOUS (DVT)  Result Date: 12/26/2021  Lower Venous DVT Study Patient Name:  GRADIE OHM  Date of Exam:   12/26/2021 Medical Rec #: [id number]       Accession #:    [id number] Date of Birth: Jan 01, 1946        Patient Gender: M Patient Age:   32 years Exam Location:  [facility] Procedure:      VAS [place] LOWER EXTREMITY VENOUS (DVT) Referring Phys: Brand Males --------------------------------------------------------------------------------  Indications: Pulmonary embolism.  Comparison Study: No previous exams Performing Technologist: Jody Hill RVT, RDMS  Examination Guidelines: A complete evaluation includes B-mode imaging, spectral Doppler, color Doppler, and power Doppler as needed of all accessible portions of each vessel. Bilateral testing is considered an integral part of a complete examination. Limited examinations for reoccurring indications may be performed as noted. The reflux portion of the exam is performed with the patient in reverse Trendelenburg.  +---------+---------------+---------+-----------+----------+-------------------+ RIGHT    CompressibilityPhasicitySpontaneityPropertiesThrombus Aging      +---------+---------------+---------+-----------+----------+-------------------+ CFV      Full           Yes      Yes                                      +---------+---------------+---------+-----------+----------+-------------------+ SFJ      Full                                                              +---------+---------------+---------+-----------+----------+-------------------+ FV Prox  Full           Yes      Yes                                      +---------+---------------+---------+-----------+----------+-------------------+ FV Mid   Partial        Yes      Yes                  Acute               +---------+---------------+---------+-----------+----------+-------------------+ FV DistalPartial        Yes      Yes                  Age Indeterminate   +---------+---------------+---------+-----------+----------+-------------------+ PFV      Full                                                             +---------+---------------+---------+-----------+----------+-------------------+ POP      Partial        Yes      Yes  Acute               +---------+---------------+---------+-----------+----------+-------------------+ PTV      Full                                                             +---------+---------------+---------+-----------+----------+-------------------+ PERO     Full                                                             +---------+---------------+---------+-----------+----------+-------------------+ Gastroc  Partial        No       No                   Acute               +---------+---------------+---------+-----------+----------+-------------------+ SSV      Partial        No       No                   Acute @ origin Age                                                        Indeterminate @                                                           prox/mid            +---------+---------------+---------+-----------+----------+-------------------+ Proximal CFV not visualized due to pressure dressing from procedure.  +---------+---------------+---------+-----------+----------+-------------------+ LEFT     CompressibilityPhasicitySpontaneityPropertiesThrombus Aging       +---------+---------------+---------+-----------+----------+-------------------+ CFV      Full           Yes      Yes                                      +---------+---------------+---------+-----------+----------+-------------------+ SFJ      Full                                                             +---------+---------------+---------+-----------+----------+-------------------+ FV Prox  Full           Yes      Yes                                      +---------+---------------+---------+-----------+----------+-------------------+ FV Mid   Full  Yes      Yes                                      +---------+---------------+---------+-----------+----------+-------------------+ FV DistalFull           Yes      Yes                                      +---------+---------------+---------+-----------+----------+-------------------+ PFV      Full                                                             +---------+---------------+---------+-----------+----------+-------------------+ POP      Full           Yes      Yes                                      +---------+---------------+---------+-----------+----------+-------------------+ PTV      Full                                         Not well visualized +---------+---------------+---------+-----------+----------+-------------------+ PERO     Full                                         Not well visualized +---------+---------------+---------+-----------+----------+-------------------+     Summary: BILATERAL: -No evidence of popliteal cyst, bilaterally. RIGHT: - Findings consistent with acute deep vein thrombosis involving the right femoral vein, right popliteal vein, and right gastrocnemius veins. - Findings consistent with acute superficial vein thrombosis involving the right small saphenous vein. - Findings consistent with age indeterminate deep vein thrombosis involving the right  femoral vein. - Findings consistent with age indeterminate superficial vein thrombosis involving the right small saphenous vein.  LEFT: - There is no evidence of deep vein thrombosis in the lower extremity. - There is no evidence of superficial venous thrombosis.  *See table(s) above for measurements and observations. Electronically signed by Monica Martinez MD on 12/26/2021 at 5:01:24 PM.    Final    IR INFUSION THROMBOL ARTERIAL INITIAL (MS)  Result Date: 12/25/2021 INDICATION: 76 year old male with submassive pulmonary embolism presents for catheter directed thrombolysis EXAM: ULTRASOUND-GUIDED ACCESS RIGHT COMMON FEMORAL VEIN ULTRASOUND-GUIDED ACCESS RIGHT COMMON FEMORAL VEIN PULMONARY ANGIOGRAM PLACEMENT OF BILATERAL LYTIC CATHETERS FOR INITIATION CATHETER DIRECTED THROMBOLYSIS COMPARISON:  CT 12/24/2021 MEDICATIONS: None. ANESTHESIA/SEDATION: Moderate (conscious) sedation was employed during this procedure. A total of Versed 0.5 mg and Fentanyl 25 mcg was administered intravenously by the radiology nurse. Total intra-service moderate Sedation Time: 50 minutes. The patient's level of consciousness and vital signs were monitored continuously by radiology nursing throughout the procedure under my direct supervision. FLUOROSCOPY: Radiation Exposure Index (as provided by the fluoroscopic device): 034 mGy Kerma COMPLICATIONS: None TECHNIQUE: Informed consent was obtained from the patient and the patient's family following explanation of the procedure, risks,  benefits and alternatives. Specific risks include bleeding, infection, contrast reaction, kidney injury, venous injury, life-threatening hemorrhage including brain hemorrhage, gastrointestinal hemorrhage, epistaxis, need for further surgery, need for further procedure, cardiopulmonary collapse, death. The patient understands, agrees and consents for the procedure. All questions were addressed. Patient is position supine position on the fluoroscopy table.  Maximal barrier sterile technique utilized including caps, mask, sterile gowns, sterile gloves, large sterile drape, hand hygiene, and chlorhexidine prep. 1% lidocaine used for local anesthesia. Moderate sedation was provided. Ultrasound survey of the right inguinal region was performed with images stored and sent to PACs, confirming patency of the right common femoral vein. A single wall needle was used access the right common femoral vein under ultrasound. With excellent blood flow returned, 035 wire was passed through the needle, observed to enter the IVC under fluoroscopy. The needle was removed, and a standard 6 [place] vascular sheath was placed. The dilator was removed and the sheath was flushed. Single wall needle was again used access the right common femoral vein under ultrasound, adjacent to the initial puncture. With excellent blood flow returned, 035 wire was passed through the needle, observed to enter the IVC under fluoroscopy. The needle was removed, and a standard 6 [place] vascular sheath was placed. The dilator was removed and the sheath was flushed. Combination of a angled pigtail catheter and a Bentson wire was then used to navigate to the right heart. Angled catheter used to select the main pulmonary artery. Wire was removed and angiogram was performed. Pressure measurement was obtained. Stiff Glidewire was then advanced through the pigtail catheter, reducing the curve and flipping into the right-sided pulmonary artery. The wire was straightened into the downgoing branches of the right pulmonary artery. Pigtail catheter was then removed and a 105 cm infusion length 90cm working length UniFuse catheter was placed. Small contrast infusion confirmed position. Catheter was flushed. The alternative sheath was then used for selection of the contralateral pulmonary artery. Pigtail catheter was advanced into the right heart and used to select the main pulmonary artery. Again the exchange stiff Glidewire  was used to reduce the curved and removed the pigtail catheter. A vertebral shaped 100 cm diagnostic catheter was then used to select the left-sided pulmonary arteries. Once the catheter was positioned into the lower lobar branches, contrast confirmed location. A 15 cm infusion length 90cm working length UniFuse catheter was placed. Wire was removed, small amount of contrast confirmed position and catheter was flushed. The obturator wires were then placed through both the left and right catheters. Sheaths were secured in position.  Final image was stored. The patient tolerated the procedure well and remained hemodynamically stable throughout. No complications were encountered and no significant blood loss was encountered. FINDINGS: Ultrasound survey demonstrates patent right common femoral vein. Main pulmonary artery pressure measures 41/14 (24). Final image demonstrates right-sided catheter directed into lower lobar branches and left sided catheter directed into lower lobar branches. The rightward sheath transmits the left-sided catheter. The leftward sheath transmits the right-sided pulmonary artery catheter. IMPRESSION: Status post US guided right CFV access and placement of left and right pulmonary artery lytic catheters for initiation of catheter directed thrombolysis. Signed, Dulcy Fanny. Earleen Newport, DO [facility] Radiology Electronically Signed   By: Corrie Mckusick D.O.   On: 12/25/2021 08:34   IR INFUSION THROMBOL ARTERIAL INITIAL (MS)  Result Date: 12/25/2021 INDICATION: 76 year old male with submassive pulmonary embolism presents for catheter directed thrombolysis EXAM: ULTRASOUND-GUIDED ACCESS RIGHT COMMON FEMORAL VEIN ULTRASOUND-GUIDED  ACCESS RIGHT COMMON FEMORAL VEIN PULMONARY ANGIOGRAM PLACEMENT OF BILATERAL LYTIC CATHETERS FOR INITIATION CATHETER DIRECTED THROMBOLYSIS COMPARISON:  CT 12/24/2021 MEDICATIONS: None. ANESTHESIA/SEDATION: Moderate (conscious)  sedation was employed during this procedure. A total of Versed 0.5 mg and Fentanyl 25 mcg was administered intravenously by the radiology nurse. Total intra-service moderate Sedation Time: 50 minutes. The patient's level of consciousness and vital signs were monitored continuously by radiology nursing throughout the procedure under my direct supervision. FLUOROSCOPY: Radiation Exposure Index (as provided by the fluoroscopic device): 578 mGy Kerma COMPLICATIONS: None TECHNIQUE: Informed consent was obtained from the patient and the patient's family following explanation of the procedure, risks, benefits and alternatives. Specific risks include bleeding, infection, contrast reaction, kidney injury, venous injury, life-threatening hemorrhage including brain hemorrhage, gastrointestinal hemorrhage, epistaxis, need for further surgery, need for further procedure, cardiopulmonary collapse, death. The patient understands, agrees and consents for the procedure. All questions were addressed. Patient is position supine position on the fluoroscopy table. Maximal barrier sterile technique utilized including caps, mask, sterile gowns, sterile gloves, large sterile drape, hand hygiene, and chlorhexidine prep. 1% lidocaine used for local anesthesia. Moderate sedation was provided. Ultrasound survey of the right inguinal region was performed with images stored and sent to PACs, confirming patency of the right common femoral vein. A single wall needle was used access the right common femoral vein under ultrasound. With excellent blood flow returned, 035 wire was passed through the needle, observed to enter the IVC under fluoroscopy. The needle was removed, and a standard 6 [place] vascular sheath was placed. The dilator was removed and the sheath was flushed. Single wall needle was again used access the right common femoral vein under ultrasound, adjacent to the initial puncture. With excellent blood flow returned, 035 wire was  passed through the needle, observed to enter the IVC under fluoroscopy. The needle was removed, and a standard 6 [place] vascular sheath was placed. The dilator was removed and the sheath was flushed. Combination of a angled pigtail catheter and a Bentson wire was then used to navigate to the right heart. Angled catheter used to select the main pulmonary artery. Wire was removed and angiogram was performed. Pressure measurement was obtained. Stiff Glidewire was then advanced through the pigtail catheter, reducing the curve and flipping into the right-sided pulmonary artery. The wire was straightened into the downgoing branches of the right pulmonary artery. Pigtail catheter was then removed and a 105 cm infusion length 90cm working length UniFuse catheter was placed. Small contrast infusion confirmed position. Catheter was flushed. The alternative sheath was then used for selection of the contralateral pulmonary artery. Pigtail catheter was advanced into the right heart and used to select the main pulmonary artery. Again the exchange stiff Glidewire was used to reduce the curved and removed the pigtail catheter. A vertebral shaped 100 cm diagnostic catheter was then used to select the left-sided pulmonary arteries. Once the catheter was positioned into the lower lobar branches, contrast confirmed location. A 15 cm infusion length 90cm working length UniFuse catheter was placed. Wire was removed, small amount of contrast confirmed position and catheter was flushed. The obturator wires were then placed through both the left and right catheters. Sheaths were secured in position.  Final image was stored. The patient tolerated the procedure well and remained hemodynamically stable throughout. No complications were encountered and no significant blood loss was encountered. FINDINGS: Ultrasound survey demonstrates patent right common femoral vein. Main pulmonary artery pressure measures 41/14 (24). Final image demonstrates  right-sided catheter directed  into lower lobar branches and left sided catheter directed into lower lobar branches. The rightward sheath transmits the left-sided catheter. The leftward sheath transmits the right-sided pulmonary artery catheter. IMPRESSION: Status post US guided right CFV access and placement of left and right pulmonary artery lytic catheters for initiation of catheter directed thrombolysis. Signed, Dulcy Fanny. Earleen Newport, DO [facility] Radiology Electronically Signed   By: Corrie Mckusick D.O.   On: 12/25/2021 08:34   IR THROMB F/U EVAL ART/VEN FINAL DAY (MS)  Result Date: 12/25/2021 INDICATION: Status post initiation of bilateral pulmonary arterial catheter directed thrombolytic therapy for submassive bilateral pulmonary embolism with infusion beginning around midnight earlier today. EXAM: COMPLETION OF BILATERAL PULMONARY ARTERIAL THROMBOLYTIC THERAPY WITH PULMONARY ARTERY PRESSURE MEASUREMENT COMPARISON:  Imaging earlier today. MEDICATIONS: None. ANESTHESIA/SEDATION: None FLUOROSCOPY: None COMPLICATIONS: None immediate. TECHNIQUE: Pressure measurements were obtained at the level of bilateral pulmonary arterial thrombolytic infusion catheters after removal of the central occlusion wires. The fusion catheters were then removed. Both right common femoral venous sheaths were then removed and hemostasis obtained with manual compression. FINDINGS: The right pulmonary arterial infusion catheter head visibly retracted at the time of the encounter with pressure waveform and measurements consistent with central venous pressure, likely at the level of the right atrium. Pressure measurements via the left pulmonary arterial infusion catheter were 36/13 mm Hg, mean 21 mm Hg. Pressure measurement before treatment was approximately 41/14 mm Hg, mean 24 mm Hg. Due to significant improvement in clinical symptoms, pulmonary arterial thrombolytic infusion was discontinued  after 12 hour bilateral infusion and both infusion catheters and venous sheaths removed. IMPRESSION: Completion of bilateral pulmonary arterial catheter directed thrombolytic therapy for submassive pulmonary embolism. There was decrease in measured pulmonary artery pressure after treatment and significant improvement in clinical symptoms. Bilateral infusion catheters and venous access sheaths were removed on completion. Electronically Signed   By: Aletta Edouard M.D.   On: 12/25/2021 16:20     Assessment & Plan:   Pulmonary embolism ([place]) -Admitted from 12/24/21-12/28/21 for acute submassive pulmonary embolism. Venous dopplers showed RLE DVT right femoral vein.  CTA on the chest showed massive PE, IR consulted to perform diuretic thrombolytic therapy. He was started on hep gtt which was changed to oral Xarelto. Continue Xarelto '15mg'$  twice daily. FU in 2-3 months with Dr. Silas Flood.    Bilateral renal masses - Incidental bilateral renal masses on CT imaging. Needs urology follow-up outpatient   Acute deep vein thrombosis (DVT) of right lower extremity ([facility]) - Venous dopplers showed RLE DVT right femoral vein. Patient has right leg swelling and pain. Encourage he elevate right leg when sitting, use heating pack and wear compression stockings to help reduce pain and swelling   Neck pain - Patient developed neck pain after working with physical therapy in patient. He has decreased lateral range of motion to his cervical spine. No ecchymosis or swelling. No trauma. Recommend he take extra strength tylenol every 6 hours as needed for pain (do not take NSAIDS such as aleve, naproxen). Rx flexeril every 8 hours as needed for neck pain (do not exceed 5 days). Do not drive while taking and combine with alcohol. Do simple neck range of motion exercises. If pain does not improving please see PCP.    Martyn Ehrich, NP 01/22/2022

## 2021-12-30 NOTE — Telephone Encounter (Signed)
Called and spoke with the pt  ?He is c/o swelling and pain rt leg where had DVT  ?He is taking xarelto  ?He states breathing seems worse today  ?Appt scheduled with Beth at 2 pm today  ?Advised to seek emergent care sooner if needed ?

## 2021-12-31 LAB — D-DIMER, QUANTITATIVE: D-Dimer, Quant: 1.38 mcg/mL FEU — ABNORMAL HIGH (ref <0.50)

## 2021-12-31 NOTE — Progress Notes (Signed)
D-dimer has significantly improved from previous. Does not need repeat CTA. Continue blood thinner as directed. CXR showed atelectasis, encourage deep breathing exercises 5-10x/ hour while awake. If symptoms worsen call office or return to ED.

## 2022-01-08 ENCOUNTER — Other Ambulatory Visit: Payer: Self-pay | Admitting: Interventional Radiology

## 2022-01-08 DIAGNOSIS — I2609 Other pulmonary embolism with acute cor pulmonale: Secondary | ICD-10-CM

## 2022-01-09 ENCOUNTER — Inpatient Hospital Stay: Payer: Medicare HMO | Admitting: Primary Care

## 2022-01-22 DIAGNOSIS — I82401 Acute embolism and thrombosis of unspecified deep veins of right lower extremity: Secondary | ICD-10-CM | POA: Insufficient documentation

## 2022-01-22 DIAGNOSIS — M542 Cervicalgia: Secondary | ICD-10-CM | POA: Insufficient documentation

## 2022-01-22 NOTE — Assessment & Plan Note (Signed)
-   Patient developed neck pain after working with physical therapy in patient. He has decreased lateral range of motion to his cervical spine. No ecchymosis or swelling. No trauma. Recommend he take extra strength tylenol every 6 hours as needed for pain (do not take NSAIDS such as aleve, naproxen). Rx flexeril every 8 hours as needed for neck pain (do not exceed 5 days). Do not drive while taking and combine with alcohol. Do simple neck range of motion exercises. If pain does not improving please see PCP.

## 2022-01-22 NOTE — Assessment & Plan Note (Addendum)
-  Admitted from 12/24/21-12/28/21 for acute submassive pulmonary embolism. Venous dopplers showed RLE DVT right femoral vein.  CTA on the chest showed massive PE, IR consulted to perform diuretic thrombolytic therapy. He was started on hep gtt which was changed to oral Xarelto. Continue Xarelto '15mg'$  twice daily. FU in 2-3 months with Dr. Silas Flood.

## 2022-01-22 NOTE — Assessment & Plan Note (Signed)
-   Incidental bilateral renal masses on CT imaging. Needs urology follow-up outpatient

## 2022-01-22 NOTE — Assessment & Plan Note (Signed)
-   Venous dopplers showed RLE DVT right femoral vein. Patient has right leg swelling and pain. Encourage he elevate right leg when sitting, use heating pack and wear compression stockings to help reduce pain and swelling

## 2022-01-29 DIAGNOSIS — G4733 Obstructive sleep apnea (adult) (pediatric): Secondary | ICD-10-CM | POA: Diagnosis not present

## 2022-01-29 DIAGNOSIS — I10 Essential (primary) hypertension: Secondary | ICD-10-CM | POA: Diagnosis not present

## 2022-01-29 DIAGNOSIS — I82401 Acute embolism and thrombosis of unspecified deep veins of right lower extremity: Secondary | ICD-10-CM | POA: Diagnosis not present

## 2022-01-29 DIAGNOSIS — N2889 Other specified disorders of kidney and ureter: Secondary | ICD-10-CM | POA: Diagnosis not present

## 2022-01-29 DIAGNOSIS — R7303 Prediabetes: Secondary | ICD-10-CM | POA: Diagnosis not present

## 2022-01-29 DIAGNOSIS — I2699 Other pulmonary embolism without acute cor pulmonale: Secondary | ICD-10-CM | POA: Diagnosis not present

## 2022-01-29 DIAGNOSIS — E669 Obesity, unspecified: Secondary | ICD-10-CM | POA: Diagnosis not present

## 2022-01-29 DIAGNOSIS — D6869 Other thrombophilia: Secondary | ICD-10-CM | POA: Diagnosis not present

## 2022-01-29 DIAGNOSIS — D692 Other nonthrombocytopenic purpura: Secondary | ICD-10-CM | POA: Diagnosis not present

## 2022-01-30 ENCOUNTER — Ambulatory Visit
Admission: RE | Admit: 2022-01-30 | Discharge: 2022-01-30 | Disposition: A | Discharge status: Home / Self Care | Payer: Medicare HMO | Source: Ambulatory Visit | Attending: Radiology | Admitting: Radiology

## 2022-01-30 ENCOUNTER — Encounter: Payer: Self-pay | Admitting: *Deleted

## 2022-01-30 ENCOUNTER — Ambulatory Visit
Admission: RE | Admit: 2022-01-30 | Discharge: 2022-01-30 | Disposition: A | Discharge status: Home / Self Care | Payer: Medicare HMO | Source: Ambulatory Visit | Attending: Interventional Radiology | Admitting: Interventional Radiology

## 2022-01-30 DIAGNOSIS — I82431 Acute embolism and thrombosis of right popliteal vein: Secondary | ICD-10-CM | POA: Diagnosis not present

## 2022-01-30 DIAGNOSIS — I2609 Other pulmonary embolism with acute cor pulmonale: Secondary | ICD-10-CM

## 2022-01-30 DIAGNOSIS — I2699 Other pulmonary embolism without acute cor pulmonale: Secondary | ICD-10-CM | POA: Diagnosis not present

## 2022-01-30 DIAGNOSIS — I872 Venous insufficiency (chronic) (peripheral): Secondary | ICD-10-CM | POA: Diagnosis not present

## 2022-01-30 DIAGNOSIS — D49512 Neoplasm of unspecified behavior of left kidney: Secondary | ICD-10-CM | POA: Diagnosis not present

## 2022-01-30 DIAGNOSIS — D49511 Neoplasm of unspecified behavior of right kidney: Secondary | ICD-10-CM | POA: Diagnosis not present

## 2022-01-30 HISTORY — PX: IR RADIOLOGIST EVAL & MGMT: IMG5224

## 2022-01-30 NOTE — Progress Notes (Signed)
Chief Complaint: RLE DVT, submassive PE  Referring Physician(s): Allred,Darrell K  History of Present Illness: Stanley Juarez is a 76 y.o. male presenting as a follow up to [place] [facility] today.   Stanley Juarez is here today with his wife for the interview.    He was admitted to [facility] 12/24/2021 through the ED with acute SOB, and diagnosis of PE.  He was chopping wood at the time when he became symptomatic.    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    He was diagnosed with a provoked DVT, submassive PE based on his PE burden, vital signs, and biomarkers.  He underwent catheter directed thrombolysis with bilateral lysis catheters, and was discharged 12/28/2021 having started anticoagulation.   He tells me today that he is taking xarelto, and that he has not had any problems.  Denies any episodes of bleeding, with no hemoptysis, no hematuria, or GI bleeding.    Venous duplex 12/26/21 showed + DVT (non-occlusive) of the right popliteal vein, gastroc vein, in addition to the SSV. Left was negative.   Repeat duplex today 01/30/22 shows: Negative left  Similar right popliteal DVT, occlusive today on the study.  Tibial veins are patent.   He says his breathing is improving and he does not feel limited with any activities.  He is walking several times daily at his home, and does not feel limited.  He did start a new blood pressure medication at the time of hospitalization (amlodipine).    He reports bilateral leg swelling, right > left.  He describes some heaviness, but no pain, no itching.  The swelling is better in the morning.  He does sleep on a bed flat, 1 pillow.  When he is watching TV, he elevates his legs on a recliner.  He has never had  a wound of the lower extremity.   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  Between the right iliac artery and  a spur from the lumbar spine.  In fact this was difficult to navigate during the placement of the catheters.    Past Medical History:  Diagnosis Date   Cancer [facility])    Sleep apnea     Past Surgical History:  Procedure Laterality Date   IR ANGIOGRAM PULMONARY BILATERAL SELECTIVE  12/25/2021   IR INFUSION THROMBOL ARTERIAL INITIAL (MS)  12/25/2021   IR INFUSION THROMBOL ARTERIAL INITIAL (MS)  12/25/2021   IR THROMB F/U EVAL ART/VEN FINAL DAY (MS)  12/25/2021   IR US GUIDE VASC ACCESS RIGHT  12/25/2021   IR US GUIDE VASC ACCESS RIGHT  12/25/2021   skin cancer removal      Allergies: Penicillins and Prednisone  Medications: Prior to Admission medications   Medication Sig Start Date End Date Taking? Authorizing Provider  allopurinol (ZYLOPRIM) 300 MG tablet Take 150 mg by mouth daily. 12/04/21   [provider]  amLODipine (NORVASC) 5 MG tablet Take 1 tablet (5 mg total) by mouth daily. 12/28/21   Charlynne Cousins, MD  aspirin EC 81 MG tablet Take 81 mg by mouth once a week. Swallow whole.    [provider]  cyclobenzaprine (FLEXERIL) 5 MG tablet Take 1 tablet (5 mg total) by mouth 3 (three) times daily as needed for muscle spasms. 12/30/21   Martyn Ehrich, NP  lisinopril (ZESTRIL)  10 MG tablet Take 10 mg by mouth daily. 12/04/21   [provider]  Rivaroxaban (XARELTO) 15 MG TABS tablet Take 1 tablet (15 mg total) by mouth 2 (two) times daily with a meal. 12/28/21   Charlynne Cousins, MD  rivaroxaban (XARELTO) 20 MG TABS tablet Take 1 tablet (20 mg total) by mouth daily with supper. 01/18/22   Charlynne Cousins, MD  traZODone (DESYREL) 100 MG tablet Take 100 mg by mouth at bedtime as needed for sleep. 12/03/21   [provider]     No family history on file.  Social History   Socioeconomic History   Marital status: Married    Spouse name: Not on file   Number of children: Not on file   Years of education: Not on file   Highest education level: Not  on file  Occupational History   Not on file  Tobacco Use   Smoking status: Never   Smokeless tobacco: Never  Vaping Use   Vaping Use: Never used  Substance and Sexual Activity   Alcohol use: Never   Drug use: Never   Sexual activity: Not on file  Other Topics Concern   Not on file  Social History Narrative   Not on file   Social Determinants of Health   Financial Resource Strain: Not on file  Food Insecurity: Not on file  Transportation Needs: Not on file  Physical Activity: Not on file  Stress: Not on file  Social Connections: Not on file    ECOG Status: 0 - Asymptomatic  Review of Systems: A 12 point ROS discussed and pertinent positives are indicated in the HPI above.  All other systems are negative.  Review of Systems  Vital Signs: There were no vitals taken for this visit.  Physical Exam General: 76 yo male appearing stated age.  Well-developed, well-nourished.  No distress. HEENT: Atraumatic, normocephalic.  Conjugate gaze, extra-ocular motor intact. No scleral icterus or scleral injection.   Oral mucosa moist, pink.  Neck: Symmetric with no goiter enlargement.  Chest/Lungs:  Symmetric chest with inspiration/expiration.  No labored breathing.  Clear to auscultation with no wheezes, rhonchi, or rales.  Heart:  RRR, with no third heart sounds appreciated. No JVD appreciated.  Abdomen:  Soft, NT/ND, with + bowel sounds.   Genito-urinary: Deferred Neurologic: Alert & Oriented to person, place, and time.   Normal affect and insight.  Appropriate questions.  Moving all 4 extremities with gross sensory intact.  Extremities:  Soft pitting edema of the right ankle > left ankle.  On the right extends slightly up the gaiter area but not the calf.   Bilateral telangiectasia/reticular veins.  - varicose veins.  Bilateral corona phlebectatica.   Circumference of the right calf: 43cm Circ of the left calf: 40cm. No wounds.  Mallampati Score:     Imaging: No results  found.  Labs:  CBC: Recent Labs    12/26/21 0308 12/26/21 1036 12/27/21 0524 12/28/21 0525  WBC 6.7 5.9 6.0 6.0  HGB 15.6 15.2 14.4 14.7  HCT 48.2 45.5 44.1 44.5  PLT 126* 127* 141* 165    COAGS: Recent Labs    12/25/21 0256  INR 1.2  APTT 90*    BMP: Recent Labs    12/24/21 1325 12/25/21 0256 12/26/21 0308 12/26/21 1036  NA 138  --  138 138  K 5.1 4.4 4.0 3.8  CL 107  --  107 111  CO2 26  --  23 22  GLUCOSE 113*  --  115* 121*  BUN 20  --  18 16  CALCIUM 9.2  --  8.6* 8.3*  CREATININE 1.33*  --  1.11 1.09  GFRNONAA 56*  --  >60 >60    LIVER FUNCTION TESTS: Recent Labs    12/24/21 1325 12/26/21 0308 12/26/21 1036  BILITOT 1.4* 0.9 0.7  AST '31 20 17  '$ ALT '29 21 19  '$ ALKPHOS 65 63 60  PROT 7.5 6.3* 6.2*  ALBUMIN 4.0 3.4* 3.2*    TUMOR MARKERS: No results for input(s): "AFPTM", "CEA", "CA199", "CHROMGRNA" in the last 8760 hours.  Assessment and Plan:  Stanley Juarez is a very pleasant 76 yo male SP treatment of submassive PE with catheter directed thrombolysis 12/24/21, also with bilateral chronic venous insufficiency (CVI).  He is doing well, and is back to his baseline activity.    His right CEAP: C-1,3,4c E - secondary A - deep P - occlusive (non-traditional may-thurner, right popliteal DVT)  Left CEAP: C-1,3,4c E-secondary A - n P - n  I had a lengthy discussion with Stanley Juarez and his wife today about VTE, both PE and DVT, and that he is on the correct medical treatment for his provoked (RCC) event.  We discussed the anatomy, pathology/pathophysiology of VTE including the entity of post-thrombotic syndrome and its symptoms and natural history.    At this time, he has fairly mild symptoms that I think can be well-controlled with compression therapy, which he is currently not using.    We discussed calf-high bilateral 46mHg as the best therapy for him.    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   At this time, I think continued medical care including compression stockings is the way to go. He agrees.    Plan: - 1 year follow up with Dr.Shemuel Harkleroad with office visit and venous duplex right lower extremity - Continue medical care - He knows he can call [facility] any concerns questions     Electronically Signed: JCorrie Mckusick6/03/2022, 9:53 AM   I spent a total of    40 Minutes in face to face in clinical consultation, greater than 50% of which was counseling/coordinating care for VTE, prior PE, bilateral CVI, with right May-thurner lesion

## 2022-01-31 ENCOUNTER — Telehealth: Payer: Self-pay | Admitting: Oncology

## 2022-01-31 NOTE — Telephone Encounter (Signed)
Scheduled appt per 6/9 referral. Pt is aware of appt date and time. Pt is aware to arrive 15 mins prior to appt time and to bring and updated insurance card. Pt is aware of appt location.   

## 2022-02-07 ENCOUNTER — Other Ambulatory Visit: Payer: Self-pay

## 2022-02-07 ENCOUNTER — Inpatient Hospital Stay: Payer: Medicare HMO | Attending: Oncology | Admitting: Oncology

## 2022-02-07 VITALS — BP 131/86 | HR 66 | Temp 97.9°F | Resp 17 | Ht 70.0 in | Wt 236.6 lb

## 2022-02-07 DIAGNOSIS — Z7901 Long term (current) use of anticoagulants: Secondary | ICD-10-CM | POA: Diagnosis not present

## 2022-02-07 DIAGNOSIS — I824Z1 Acute embolism and thrombosis of unspecified deep veins of right distal lower extremity: Secondary | ICD-10-CM

## 2022-02-07 NOTE — Progress Notes (Signed)
Reason for the request:    Venous thromboembolism  HPI: I was asked by Stanley Juarez to evaluate Stanley Juarez for the evaluation of fall venous thromboembolism.  He is a 76 year old man with history of hypertension and sleep apnea admitted on Dec 24, 2021 with dizziness and his work-up for included a CT scan of the chest which showed massive pulmonary embolism.  He required catheter directed thrombolysis completed by interventional radiology.  He subsequently was discharged on Dec 28, 2021 on Xarelto.  Lower extremity Doppler showed nonocclusive DVT of the right popliteal vein, gastric vein and the superior saphenous.  No thrombosis noted on the left side.  He also found an incidental findings of bilateral renal masses consistent with neoplasm.  He was noted to have 7 cm on the right and 2.5 cm on the left.  He was evaluated by Stanley Juarez for possible nephrectomy to be planned in the future.  Clinically, he reports feeling reasonably well without any major complaints.  He does report some dyspnea on exertion and fatigue.  No chest pain or palpitation.  He denies any complications related to Xarelto.  He does not report any headaches, blurry vision, syncope or seizures. Does not report any fevers, chills or sweats.  Does not report any cough, wheezing or hemoptysis.  Does not report any chest pain, palpitation, orthopnea or leg edema.  Does not report any nausea, vomiting or abdominal pain.  Does not report any constipation or diarrhea.  Does not report any skeletal complaints.    Does not report frequency, urgency or hematuria.  Does not report any skin rashes or lesions. Does not report any heat or cold intolerance.  Does not report any lymphadenopathy or petechiae.  Does not report any anxiety or depression.  Remaining review of systems is negative.     Past Medical History:  Diagnosis Date   Cancer [facility])    Sleep apnea   :   Past Surgical History:  Procedure Laterality Date   IR ANGIOGRAM PULMONARY  BILATERAL SELECTIVE  12/25/2021   IR INFUSION THROMBOL ARTERIAL INITIAL (MS)  12/25/2021   IR INFUSION THROMBOL ARTERIAL INITIAL (MS)  12/25/2021   IR RADIOLOGIST EVAL & MGMT  01/30/2022   IR THROMB F/U EVAL ART/VEN FINAL DAY (MS)  12/25/2021   IR US GUIDE VASC ACCESS RIGHT  12/25/2021   IR US GUIDE VASC ACCESS RIGHT  12/25/2021   skin cancer removal    :   Current Outpatient Medications:    allopurinol (ZYLOPRIM) 300 MG tablet, Take 150 mg by mouth daily., Disp: , Rfl:    amLODipine (NORVASC) 5 MG tablet, Take 1 tablet (5 mg total) by mouth daily., Disp: 30 tablet, Rfl: 0   aspirin EC 81 MG tablet, Take 81 mg by mouth once a week. Swallow whole., Disp: , Rfl:    cyclobenzaprine (FLEXERIL) 5 MG tablet, Take 1 tablet (5 mg total) by mouth 3 (three) times daily as needed for muscle spasms., Disp: 15 tablet, Rfl: 0   lisinopril (ZESTRIL) 10 MG tablet, Take 10 mg by mouth daily., Disp: , Rfl:    Rivaroxaban (XARELTO) 15 MG TABS tablet, Take 1 tablet (15 mg total) by mouth 2 (two) times daily with a meal., Disp: 42 tablet, Rfl: 0   rivaroxaban (XARELTO) 20 MG TABS tablet, Take 1 tablet (20 mg total) by mouth daily with supper., Disp: 30 tablet, Rfl: 0   traZODone (DESYREL) 100 MG tablet, Take 100 mg by mouth at bedtime as needed for  sleep., Disp: , Rfl: :   Allergies  Allergen Reactions   Penicillins     Did it involve swelling of the face/tongue/throat, SOB, or low BP? Yes Did it involve sudden or severe rash/hives, skin peeling, or any reaction on the inside of your mouth or nose? Yes Did you need to seek medical attention at a hospital or doctor's office? No When did it last happen?    childhood   If all above answers are "NO", may proceed with cephalosporin use.     Prednisone     Constipation  :  No family history on file.:   Social History   Socioeconomic History   Marital status: Married    Spouse name: Not on file   Number of children: Not on file   Years of education: Not on file    Highest education level: Not on file  Occupational History   Not on file  Tobacco Use   Smoking status: Never   Smokeless tobacco: Never  Vaping Use   Vaping Use: Never used  Substance and Sexual Activity   Alcohol use: Never   Drug use: Never   Sexual activity: Not on file  Other Topics Concern   Not on file  Social History Narrative   Not on file   Social Determinants of Health   Financial Resource Strain: Not on file  Food Insecurity: Not on file  Transportation Needs: Not on file  Physical Activity: Not on file  Stress: Not on file  Social Connections: Not on file  Intimate Partner Violence: Not on file  :  Pertinent items are noted in HPI.  Exam: Blood pressure 131/86, pulse 66, temperature 97.9 F (36.6 C), temperature source Temporal, resp. rate 17, height '5\' 10"'$  (1.778 m), weight 236 lb 9.6 oz (107.3 kg), SpO2 96 %. ECOG 1 General appearance: alert and cooperative appeared without distress. Head: atraumatic without any abnormalities. Eyes: conjunctivae/corneas clear. PERRL.  Sclera anicteric. Throat: lips, mucosa, and tongue normal; without oral thrush or ulcers. Resp: clear to auscultation bilaterally without rhonchi, wheezes or dullness to percussion. Cardio: regular rate and rhythm, S1, S2 normal, no murmur, click, rub or gallop GI: soft, non-tender; bowel sounds normal; no masses,  no organomegaly Skin: Skin color, texture, turgor normal. No rashes or lesions Lymph nodes: Cervical, supraclavicular, and axillary nodes normal. Neurologic: Grossly normal without any motor, sensory or deep tendon reflexes. Musculoskeletal: No joint deformity or effusion.    US Venous Img Lower Bilateral (DVT)  Result Date: 01/31/2022 CLINICAL DATA:  76 year old male with a history of VTE, PE EXAM: BILATERAL LOWER EXTREMITY VENOUS DOPPLER ULTRASOUND TECHNIQUE: Gray-scale sonography with graded compression, as well as color Doppler and duplex ultrasound were performed to  evaluate the lower extremity deep venous systems from the level of the common femoral vein and including the common femoral, femoral, profunda femoral, popliteal and calf veins including the posterior tibial, peroneal and gastrocnemius veins when visible. The superficial great saphenous vein was also interrogated. Spectral Doppler was utilized to evaluate flow at rest and with distal augmentation maneuvers in the common femoral, femoral and popliteal veins. COMPARISON:  In-hospital duplex first week of May FINDINGS: RIGHT LOWER EXTREMITY Common Femoral Vein: No evidence of thrombus. Normal compressibility, respiratory phasicity and response to augmentation. Saphenofemoral Junction: No evidence of thrombus. Normal compressibility and flow on color Doppler imaging. Profunda Femoral Vein: No evidence of thrombus. Normal compressibility and flow on color Doppler imaging. Femoral Vein: No evidence of thrombus. Normal compressibility, respiratory phasicity  and response to augmentation. Popliteal Vein: Occlusive DVT of the popliteal vein. Calf Veins: No evidence of thrombus. Normal compressibility and flow on color Doppler imaging. Superficial Great Saphenous Vein: No evidence of thrombus. Normal compressibility and flow on color Doppler imaging. Other Findings:  None. LEFT LOWER EXTREMITY Common Femoral Vein: No evidence of thrombus. Normal compressibility, respiratory phasicity and response to augmentation. Saphenofemoral Junction: No evidence of thrombus. Normal compressibility and flow on color Doppler imaging. Profunda Femoral Vein: No evidence of thrombus. Normal compressibility and flow on color Doppler imaging. Femoral Vein: No evidence of thrombus. Normal compressibility, respiratory phasicity and response to augmentation. Popliteal Vein: No evidence of thrombus. Normal compressibility, respiratory phasicity and response to augmentation. Calf Veins: No evidence of thrombus. Normal compressibility and flow on color  Doppler imaging. Superficial Great Saphenous Vein: No evidence of thrombus. Normal compressibility and flow on color Doppler imaging. Other Findings:  None. IMPRESSION: Directed duplex of the right lower extremity demonstrates persisting right popliteal vein DVT, occlusive on the current and compatible with the patient's history. Directed duplex left lower extremity negative for DVT. Electronically Signed   By: Corrie Mckusick D.O.   On: 01/31/2022 08:12   IR Radiologist Eval & Mgmt  Result Date: 01/30/2022 Please refer to notes tab for details about interventional procedure. (Op Note)   Assessment and Plan:   76 year old man with:  1.  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  He required catheter directed thrombolysis and currently on Xarelto.  The natural course and the treatment options of these findings were discussed at this time.  This episode appears to be provoked by malignancy due to to her compression of the venous structure.  The possibility of inherited or acquired thrombophilia were discussed but appears to be less likely.  He denies any other provoking symptoms at this time.  I recommended proceeding with a hypercoagulable panel to evaluate for possible inherited or acquired thrombophilia only if he has recurrent thrombosis in the future.  The duration of anticoagulation was discussed and given the severity of his pulmonary embolism and the possibility of this being unprovoked I recommend long-term anticoagulation for possible lifetime.  2.  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  Given his high risk pulmonary embolism interruption of anticoagulation is not recommended at this time.  Delaying nephrectomy as well could be detrimental if the delay is too long.  At this time, I have recommend continuing full dose anticoagulation for at least 3 to 6 months prior to  consideration for nephrectomy.  Given the size of the tumor I would favor proceeding with nephrectomy after full 3 months of anticoagulation which would be around August or 2023.  Prior to proceeding with nephrectomy I recommend evaluation and clearance from pulmonary medicine as well as consideration for an IVC filter for extra protection.  3.  Follow-up: I will arrange follow-up after his nephrectomy to discuss any additional treatment for malignancy or thrombosis standpoint.  60  minutes were dedicated to this visit. The time was spent on reviewing laboratory data, imaging studies, discussing treatment options, discussing differential diagnosis and answering questions regarding future plan.     A copy of this consult has been forwarded to the requesting physician.

## 2022-02-18 DIAGNOSIS — N2889 Other specified disorders of kidney and ureter: Secondary | ICD-10-CM | POA: Diagnosis not present

## 2022-02-18 DIAGNOSIS — R0602 Shortness of breath: Secondary | ICD-10-CM | POA: Diagnosis not present

## 2022-02-18 DIAGNOSIS — Z86711 Personal history of pulmonary embolism: Secondary | ICD-10-CM | POA: Diagnosis not present

## 2022-02-18 DIAGNOSIS — R0981 Nasal congestion: Secondary | ICD-10-CM | POA: Diagnosis not present

## 2022-02-28 ENCOUNTER — Ambulatory Visit: Payer: Medicare HMO | Admitting: Pulmonary Disease

## 2022-02-28 ENCOUNTER — Encounter: Payer: Self-pay | Admitting: Pulmonary Disease

## 2022-02-28 VITALS — BP 118/66 | HR 63 | Temp 97.8°F | Ht 70.0 in | Wt 235.0 lb

## 2022-02-28 DIAGNOSIS — I2602 Saddle embolus of pulmonary artery with acute cor pulmonale: Secondary | ICD-10-CM | POA: Diagnosis not present

## 2022-02-28 DIAGNOSIS — I2693 Single subsegmental pulmonary embolism without acute cor pulmonale: Secondary | ICD-10-CM

## 2022-02-28 DIAGNOSIS — N2889 Other specified disorders of kidney and ureter: Secondary | ICD-10-CM | POA: Diagnosis not present

## 2022-02-28 NOTE — Progress Notes (Signed)
$'@Patient'T$  ID: Stanley Juarez, male    DOB: Jan 19, 1946, 76 y.o.   MRN: [id number]  Chief Complaint  Patient presents with   Follow-up    Pt states his breathing is still the same. Pt states he became overheated on the 4th of July and felt drained.    Referring provider: Mayra Neer, MD  HPI:   76 y.o. man whom we are seeing for evaluation of provoked submassive pulmonary embolus.  Discharge summary 12/2021 reviewed.  Most recent pulmonary note Derl Barrow, NP reviewed.  Most recent hematology note reviewed.  Patient was splitting wood.  Toss long toward splitter about 15 feet away.  Had sudden onset of chest pressure/pain.  Substernal.  This seemed to resolve after a few moments.  He thought it was just a muscle strain.  Later that evening he developed significant dyspnea, really shortness of breath at rest.  Cannot sleep due to how short of breath he was.  This prompted presentation to the ED.  There CTA PE protocol was performed on my review interpretation shows significant clot burden, otherwise clear lungs.  RV to LV ratio was mildly increased.  BNP and troponin both elevated.  Consistent with submassive pulmonary embolus.  Unfortunate, no echocardiogram was done.  He was evaluated by IR and underwent catheter directed lytic therapy.  He was on heparin in the hospital and transition to [place].  He completed loading dose.  Continues on Xarelto 20 mg daily.  He denies any issues with bleeding.  No hematochezia or melena.  No hematuria.  No hematemesis or hemoptysis.  Reviewed lower extremity Dopplers 12/2021 that demonstrated significant right-sided DVT.  The Dopplers were repeated 01/2022 by hematology and showed resolution of most clot with residual popliteal clot.  The CTA PE protocol did incidentally find renal mass on the right with nodularity on the left.  This prompted urology referral.  Ongoing work-up.  High suspicion for cancer which has been shared with him.  Likely predisposing  factor to blood clot which was discussed today.  Plan for surgery after 3 months of anticoagulation.  Sounds like you need a right total nephrectomy.  PMH: Hypertension Surgical history: Skin cancer removal Family history:History reviewed. No pertinent family history. Social history: Never smoker, [place]  Questionaires / Pulmonary Flowsheets:   ACT:      No data to display          MMRC:     No data to display          Epworth:      No data to display          Tests:   FENO:  No results found for: "NITRICOXIDE"  PFT:     No data to display          WALK:     12/30/2021    3:17 PM  SIX MIN WALK  Supplimental Oxygen during Test? (L/min) No  Tech Comments: pt walked at an average pace completing all required laps denying any complaints of SOB.    Imaging: Personally reviewed and as per EMR discussion this note US Venous Img Lower Bilateral (DVT)  Result Date: 01/31/2022 CLINICAL DATA:  76 year old male with a history of VTE, PE EXAM: BILATERAL LOWER EXTREMITY VENOUS DOPPLER ULTRASOUND TECHNIQUE: Gray-scale sonography with graded compression, as well as color Doppler and duplex ultrasound were performed to evaluate the lower extremity deep venous systems from the level of the common femoral vein and including the common femoral, femoral,  profunda femoral, popliteal and calf veins including the posterior tibial, peroneal and gastrocnemius veins when visible. The superficial great saphenous vein was also interrogated. Spectral Doppler was utilized to evaluate flow at rest and with distal augmentation maneuvers in the common femoral, femoral and popliteal veins. COMPARISON:  In-hospital duplex first week of May FINDINGS: RIGHT LOWER EXTREMITY Common Femoral Vein: No evidence of thrombus. Normal compressibility, respiratory phasicity and response to augmentation. Saphenofemoral Junction: No evidence of thrombus. Normal compressibility and flow on color Doppler  imaging. Profunda Femoral Vein: No evidence of thrombus. Normal compressibility and flow on color Doppler imaging. Femoral Vein: No evidence of thrombus. Normal compressibility, respiratory phasicity and response to augmentation. Popliteal Vein: Occlusive DVT of the popliteal vein. Calf Veins: No evidence of thrombus. Normal compressibility and flow on color Doppler imaging. Superficial Great Saphenous Vein: No evidence of thrombus. Normal compressibility and flow on color Doppler imaging. Other Findings:  None. LEFT LOWER EXTREMITY Common Femoral Vein: No evidence of thrombus. Normal compressibility, respiratory phasicity and response to augmentation. Saphenofemoral Junction: No evidence of thrombus. Normal compressibility and flow on color Doppler imaging. Profunda Femoral Vein: No evidence of thrombus. Normal compressibility and flow on color Doppler imaging. Femoral Vein: No evidence of thrombus. Normal compressibility, respiratory phasicity and response to augmentation. Popliteal Vein: No evidence of thrombus. Normal compressibility, respiratory phasicity and response to augmentation. Calf Veins: No evidence of thrombus. Normal compressibility and flow on color Doppler imaging. Superficial Great Saphenous Vein: No evidence of thrombus. Normal compressibility and flow on color Doppler imaging. Other Findings:  None. IMPRESSION: Directed duplex of the right lower extremity demonstrates persisting right popliteal vein DVT, occlusive on the current and compatible with the patient's history. Directed duplex left lower extremity negative for DVT. Electronically Signed   By: Corrie Mckusick D.O.   On: 01/31/2022 08:12   IR Radiologist Eval & Mgmt  Result Date: 01/30/2022 Please refer to notes tab for details about interventional procedure. (Op Note)   Lab Results: Personally reviewed CBC    Component Value Date/Time   WBC 6.0 12/28/2021 0525   RBC 4.84 12/28/2021 0525   HGB 14.7 12/28/2021 0525   HCT 44.5  12/28/2021 0525   PLT 165 12/28/2021 0525   MCV 91.9 12/28/2021 0525   MCH 30.4 12/28/2021 0525   MCHC 33.0 12/28/2021 0525   RDW 13.2 12/28/2021 0525    BMET    Component Value Date/Time   NA 138 12/26/2021 1036   K 3.8 12/26/2021 1036   CL 111 12/26/2021 1036   CO2 22 12/26/2021 1036   GLUCOSE 121 (H) 12/26/2021 1036   BUN 16 12/26/2021 1036   CREATININE 1.09 12/26/2021 1036   CALCIUM 8.3 (L) 12/26/2021 1036   GFRNONAA >60 12/26/2021 1036    BNP    Component Value Date/Time   BNP 72.3 12/27/2021 0524    ProBNP No results found for: "PROBNP"  Specialty Problems   None   Allergies  Allergen Reactions   Penicillins     Did it involve swelling of the face/tongue/throat, SOB, or low BP? Yes Did it involve sudden or severe rash/hives, skin peeling, or any reaction on the inside of your mouth or nose? Yes Did you need to seek medical attention at a hospital or doctor's office? No When did it last happen?    childhood   If all above answers are "NO", may proceed with cephalosporin use.     Prednisone     Constipation    Immunization History  Administered Date(s) Administered   Moderna Sars-Covid-2 Vaccination 10/07/2019, 11/02/2019   Pneumococcal Conjugate-13 08/03/2013   Pneumococcal Polysaccharide-23 08/28/2014   Td 08/25/1997   Tdap 07/26/2012   Zoster, Live 07/26/2012    Past Medical History:  Diagnosis Date   Cancer ([place])    Sleep apnea     Tobacco History: Social History   Tobacco Use  Smoking Status Never  Smokeless Tobacco Never   Counseling given: Not Answered   Continue to not smoke  Outpatient Encounter Medications as of 02/28/2022  Medication Sig   allopurinol (ZYLOPRIM) 300 MG tablet Take 150 mg by mouth daily.   amLODipine (NORVASC) 5 MG tablet Take 1 tablet (5 mg total) by mouth daily.   lisinopril (ZESTRIL) 10 MG tablet Take 10 mg by mouth daily.   rivaroxaban (XARELTO) 20 MG TABS tablet Take 1 tablet (20 mg total) by mouth  daily with supper.   traZODone (DESYREL) 100 MG tablet Take 100 mg by mouth at bedtime as needed for sleep.   [DISCONTINUED] aspirin EC 81 MG tablet Take 81 mg by mouth once a week. Swallow whole.   [DISCONTINUED] cyclobenzaprine (FLEXERIL) 5 MG tablet Take 1 tablet (5 mg total) by mouth 3 (three) times daily as needed for muscle spasms.   [DISCONTINUED] Rivaroxaban (XARELTO) 15 MG TABS tablet Take 1 tablet (15 mg total) by mouth 2 (two) times daily with a meal.   No facility-administered encounter medications on file as of 02/28/2022.     Review of Systems  Review of Systems  No chest pain titration.  No orthopnea or PND.  Comprehensive review of systems otherwise negative. Physical Exam  BP 118/66 (BP Location: Right Arm, Patient Position: Sitting, Cuff Size: Normal)   Pulse 63   Temp 97.8 F (36.6 C) (Oral)   Ht '5\' 10"'$  (1.778 m)   Wt 235 lb (106.6 kg)   SpO2 97%   BMI 33.72 kg/m   Wt Readings from Last 5 Encounters:  02/28/22 235 lb (106.6 kg)  02/07/22 236 lb 9.6 oz (107.3 kg)  12/30/21 233 lb 6.4 oz (105.9 kg)  12/28/21 236 lb 12.4 oz (107.4 kg)    BMI Readings from Last 5 Encounters:  02/28/22 33.72 kg/m  02/07/22 33.95 kg/m  12/30/21 33.49 kg/m  12/28/21 33.97 kg/m     Physical Exam General: Well-appearing, no acute distress Eyes: EOMI, icterus Neck: Supple, no JVP Pulmonary: Clear, normal work of breathing Cardiovascular: Regular rhythm, warm Abdomen: Nondistended, bowel sounds present MSK: No synovitis, joint effusion Neuro: Normal gait, no weakness Psych: Normal mood, full affect   Assessment & Plan:   Provoked submassive pulmonary embolism: Provoked in the setting of likely malignancy undiagnosed.  Bilateral renal masses.  Troponin and BNP elevated.  Slightly increased RV to LV ratio on CT scan.  Unfortunately no echocardiogram was performed.  Underwent catheter directed lytics.  Symptoms improved.  Continue on Xarelto 20 mg daily.  Recommend  indefinite anticoagulation.  He expresses understanding.  We will obtain TTE next month to evaluate for RV dysfunction.  He is at risk for RV dysfunction in setting of known sleep apnea, unfortunately no preceding echocardiogram to compare to be more definitive in terms of ongoing PE that could be contributing if RV is found to be abnormal.  If any signs of RV dysfunction elevated pulmonary pressures, would pursue VQ scan.  Given improvement in symptoms, to the optimistic this will look okay.  Renal masses: Undergoing work-up by urology.  Plan for surgery sounds like September.  Wanted 3 months of anticoagulation for PE.  Really I am happy to assist with anticoagulation plan if needed via urology team.   Return in about 6 weeks (around 04/11/2022).   Lanier Clam, MD 02/28/2022   This appointment required 60 minutes of patient care (this includes precharting, chart review, review of results, face-to-face care, etc.).

## 2022-02-28 NOTE — Patient Instructions (Signed)
Nice to meet you  I agree we should continue the Xarelto indefinitely  I am glad you are feeling better  We will get echocardiogram (heart ultrasound) sometime next month with follow-up with me shortly thereafter to make sure that the heart is not being stressed.  If it is, we may need to make additional plans or additional images to make sure that the blood clot in the lungs have gotten better.  Most the times when your symptoms are better, the heart is somewhat better, and there is no need for additional testing.  But we will figure it out once we get the results.  Do not change travel plans for surgery plans for the ultrasound and follow-up with me.  Let us know if we need to reschedule or change and we will arrange it around these events.  Return to clinic in 6 weeks or sooner as needed with Dr. Silas Flood following echocardiogram to discuss results.

## 2022-03-06 DIAGNOSIS — D49511 Neoplasm of unspecified behavior of right kidney: Secondary | ICD-10-CM | POA: Diagnosis not present

## 2022-03-06 DIAGNOSIS — D49512 Neoplasm of unspecified behavior of left kidney: Secondary | ICD-10-CM | POA: Diagnosis not present

## 2022-03-18 ENCOUNTER — Other Ambulatory Visit: Payer: Self-pay | Admitting: Urology

## 2022-03-19 ENCOUNTER — Other Ambulatory Visit: Payer: Self-pay | Admitting: Urology

## 2022-03-28 DIAGNOSIS — K5732 Diverticulitis of large intestine without perforation or abscess without bleeding: Secondary | ICD-10-CM | POA: Diagnosis not present

## 2022-03-28 DIAGNOSIS — R1030 Lower abdominal pain, unspecified: Secondary | ICD-10-CM | POA: Diagnosis not present

## 2022-03-28 DIAGNOSIS — M79639 Pain in unspecified forearm: Secondary | ICD-10-CM | POA: Diagnosis not present

## 2022-03-28 DIAGNOSIS — R142 Eructation: Secondary | ICD-10-CM | POA: Diagnosis not present

## 2022-04-09 ENCOUNTER — Ambulatory Visit (HOSPITAL_COMMUNITY): Payer: Medicare HMO | Attending: Cardiology

## 2022-04-09 DIAGNOSIS — I2602 Saddle embolus of pulmonary artery with acute cor pulmonale: Secondary | ICD-10-CM

## 2022-04-09 LAB — ECHOCARDIOGRAM COMPLETE
Area-P 1/2: 3.77 cm2
S' Lateral: 3.4 cm

## 2022-04-17 ENCOUNTER — Encounter: Payer: Self-pay | Admitting: Pulmonary Disease

## 2022-04-17 ENCOUNTER — Ambulatory Visit: Payer: Medicare HMO | Admitting: Pulmonary Disease

## 2022-04-17 VITALS — BP 136/72 | HR 68 | Temp 98.4°F | Ht 70.0 in | Wt 236.4 lb

## 2022-04-17 DIAGNOSIS — N2889 Other specified disorders of kidney and ureter: Secondary | ICD-10-CM | POA: Diagnosis not present

## 2022-04-17 DIAGNOSIS — I2602 Saddle embolus of pulmonary artery with acute cor pulmonale: Secondary | ICD-10-CM | POA: Diagnosis not present

## 2022-04-17 NOTE — Progress Notes (Signed)
$'@Patient'Z$  ID: Stanley Juarez, male    DOB: 05/02/1946, 76 y.o.   MRN: [id number]  Chief Complaint  Patient presents with   Follow-up    Follow up for pulm embolism. Pt is on xarelto daily. Pt states no side effects noted thus far. Pt did have a echocardiogram on 8/16.     Referring provider: Mayra Neer, MD  HPI:   76 y.o. man whom we are seeing in follow up for evaluation of provoked submassive pulmonary embolus.    Doing well. Reports good adherence to Xarelto. Recent TTE 03/2022 shows normal RV size, function, estimated RVSP/PASP.  Tolerating Xarelto well.  Uses a bit when he scratches himself.  No issues with hematemesis, melena, hematochezia.  He has several questions about blood thinners and upcoming surgery.  Answered to the best my ability.  HPI at initial visit: Patient was splitting wood.  Toss long toward splitter about 15 feet away.  Had sudden onset of chest pressure/pain.  Substernal.  This seemed to resolve after a few moments.  He thought it was just a muscle strain.  Later that evening he developed significant dyspnea, really shortness of breath at rest.  Cannot sleep due to how short of breath he was.  This prompted presentation to the ED.  There CTA PE protocol was performed on my review interpretation shows significant clot burden, otherwise clear lungs.  RV to LV ratio was mildly increased.  BNP and troponin both elevated.  Consistent with submassive pulmonary embolus.  Unfortunate, no echocardiogram was done.  He was evaluated by IR and underwent catheter directed lytic therapy.  He was on heparin in the hospital and transition to [place].  He completed loading dose.  Continues on Xarelto 20 mg daily.  He denies any issues with bleeding.  No hematochezia or melena.  No hematuria.  No hematemesis or hemoptysis.  Reviewed lower extremity Dopplers 12/2021 that demonstrated significant right-sided DVT.  The Dopplers were repeated 01/2022 by hematology and showed resolution of  most clot with residual popliteal clot.  The CTA PE protocol did incidentally find renal mass on the right with nodularity on the left.  This prompted urology referral.  Ongoing work-up.  High suspicion for cancer which has been shared with him.  Likely predisposing factor to blood clot which was discussed today.  Plan for surgery after 3 months of anticoagulation.  Sounds like you need a right total nephrectomy.  PMH: Hypertension Surgical history: Skin cancer removal Family history:History reviewed. No pertinent family history. Social history: Never smoker, [place] in [place]  Questionaires / Pulmonary Flowsheets:   ACT:      No data to display          MMRC:     No data to display          Epworth:      No data to display          Tests:   FENO:  No results found for: "NITRICOXIDE"  PFT:     No data to display          WALK:     12/30/2021    3:17 PM  SIX MIN WALK  Supplimental Oxygen during Test? (L/min) No  Tech Comments: pt walked at an average pace completing all required laps denying any complaints of SOB.    Imaging: Personally reviewed and as per EMR discussion this note ECHOCARDIOGRAM COMPLETE  Result Date: 04/09/2022    ECHOCARDIOGRAM REPORT   Patient Name:  Stanley Juarez Date of Exam: 04/09/2022 Medical Rec #:  [id number]      Height:       70.0 in Accession #:    [id number]     Weight:       235.0 lb Date of Birth:  [birth year]       BSA:          2.235 m Patient Age:    62 years       BP:           130/82 mmHg Patient Gender: M              HR:           75 bpm. Exam Location:  [facility] Procedure: 2D Echo, 3D Echo, Cardiac Doppler, Color Doppler and Strain Analysis Indications:    I25.02 Pulmonary embolus  History:        Patient has no prior history of Echocardiogram examinations.                 Pulmonary embolus. DVT.  Sonographer:    Basilia Jumbo BS, RDCS Referring Phys: [place]  1. Left ventricular ejection  fraction, by estimation, is 60 to 65%. The left ventricle has normal function. The left ventricle has no regional wall motion abnormalities. Left ventricular diastolic parameters are consistent with Grade II diastolic dysfunction (pseudonormalization). The average left ventricular global longitudinal strain is -23.4 %. The global longitudinal strain is normal.  2. Right ventricular systolic function is normal. The right ventricular size is normal. Tricuspid regurgitation signal is inadequate for assessing PA pressure.  3. Right atrial size was mildly dilated.  4. The mitral valve is normal in structure. Trivial mitral valve regurgitation. No evidence of mitral stenosis.  5. The aortic valve is tricuspid. Aortic valve regurgitation is not visualized. No aortic stenosis is present.  6. Aortic dilatation noted. There is mild dilatation of the ascending aorta, measuring 40 mm.  7. The inferior vena cava is normal in size with greater than 50% respiratory variability, suggesting right atrial pressure of 3 mmHg. FINDINGS  Left Ventricle: Left ventricular ejection fraction, by estimation, is 60 to 65%. The left ventricle has normal function. The left ventricle has no regional wall motion abnormalities. The average left ventricular global longitudinal strain is -23.4 %. The global longitudinal strain is normal. The left ventricular internal cavity size was normal in size. There is no left ventricular hypertrophy. Left ventricular diastolic parameters are consistent with Grade II diastolic dysfunction (pseudonormalization). Right Ventricle: The right ventricular size is normal. No increase in right ventricular wall thickness. Right ventricular systolic function is normal. Tricuspid regurgitation signal is inadequate for assessing PA pressure. Left Atrium: Left atrial size was normal in size. Right Atrium: Right atrial size was mildly dilated. Pericardium: There is no evidence of pericardial effusion. Mitral Valve: The mitral  valve is normal in structure. Trivial mitral valve regurgitation. No evidence of mitral valve stenosis. Tricuspid Valve: The tricuspid valve is normal in structure. Tricuspid valve regurgitation is not demonstrated. Aortic Valve: The aortic valve is tricuspid. Aortic valve regurgitation is not visualized. No aortic stenosis is present. Pulmonic Valve: The pulmonic valve was normal in structure. Pulmonic valve regurgitation is trivial. Aorta: Aortic dilatation noted. There is mild dilatation of the ascending aorta, measuring 40 mm. Venous: The inferior vena cava is normal in size with greater than 50% respiratory variability, suggesting right atrial pressure of 3 mmHg. IAS/Shunts: No atrial level shunt detected by  color flow Doppler.  LEFT VENTRICLE PLAX 2D LVIDd:         5.00 cm   Diastology LVIDs:         3.40 cm   LV e' medial:    7.20 cm/s LV PW:         0.80 cm   LV E/e' medial:  10.2 LV IVS:        0.90 cm   LV e' lateral:   10.50 cm/s LVOT diam:     2.30 cm   LV E/e' lateral: 7.0 LV SV:         102 LV SV Index:   45        2D Longitudinal Strain LVOT Area:     4.15 cm  2D Strain GLS (A2C):   -29.5 %                          2D Strain GLS (A3C):   -20.5 %                          2D Strain GLS (A4C):   -20.4 %                          2D Strain GLS Avg:     -23.4 %                           3D Volume EF:                          3D EF:        67 %                          LV EDV:       163 ml                          LV ESV:       55 ml                          LV SV:        109 ml RIGHT VENTRICLE             IVC RV Basal diam:  3.40 cm     IVC diam: 1.80 cm RV S prime:     14.65 cm/s TAPSE (M-mode): 2.6 cm LEFT ATRIUM             Index        RIGHT ATRIUM           Index LA diam:        4.60 cm 2.06 cm/m   RA Pressure: 3.00 mmHg LA Vol (A2C):   64.0 ml 28.63 ml/m  RA Area:     20.70 cm LA Vol (A4C):   36.3 ml 16.24 ml/m  RA Volume:   60.80 ml  27.20 ml/m LA Biplane Vol: 49.4 ml 22.10 ml/m  AORTIC VALVE  LVOT Vmax:   137.33 cm/s LVOT Vmean:  87.300 cm/s LVOT VTI:    0.245 m  AORTA Ao Root diam: 3.70 cm Ao Asc diam:  4.00 cm MITRAL VALVE  TRICUSPID VALVE                            Estimated RAP:  3.00 mmHg MV Decel Time: 201 msec MV E velocity: 73.50 cm/s  SHUNTS MV A velocity: 47.40 cm/s  Systemic VTI:  0.24 m MV E/A ratio:  1.55        Systemic Diam: 2.30 cm Dalton McleanMD Electronically signed by Franki Monte Signature Date/Time: 04/09/2022/2:43:02 PM    Final     Lab Results: Personally reviewed CBC    Component Value Date/Time   WBC 6.0 12/28/2021 0525   RBC 4.84 12/28/2021 0525   HGB 14.7 12/28/2021 0525   HCT 44.5 12/28/2021 0525   PLT 165 12/28/2021 0525   MCV 91.9 12/28/2021 0525   MCH 30.4 12/28/2021 0525   MCHC 33.0 12/28/2021 0525   RDW 13.2 12/28/2021 0525    BMET    Component Value Date/Time   NA 138 12/26/2021 1036   K 3.8 12/26/2021 1036   CL 111 12/26/2021 1036   CO2 22 12/26/2021 1036   GLUCOSE 121 (H) 12/26/2021 1036   BUN 16 12/26/2021 1036   CREATININE 1.09 12/26/2021 1036   CALCIUM 8.3 (L) 12/26/2021 1036   GFRNONAA >60 12/26/2021 1036    BNP    Component Value Date/Time   BNP 72.3 12/27/2021 0524    ProBNP No results found for: "PROBNP"  Specialty Problems   None   Allergies  Allergen Reactions   Penicillins     Did it involve swelling of the face/tongue/throat, SOB, or low BP? Yes Did it involve sudden or severe rash/hives, skin peeling, or any reaction on the inside of your mouth or nose? Yes Did you need to seek medical attention at a hospital or doctor's office? No When did it last happen?    childhood   If all above answers are "NO", may proceed with cephalosporin use.     Prednisone     Constipation    Immunization History  Administered Date(s) Administered   Moderna Sars-Covid-2 Vaccination 10/07/2019, 11/02/2019   Pneumococcal Conjugate-13 08/03/2013   Pneumococcal Polysaccharide-23 08/28/2014   Td  08/25/1997   Tdap 07/26/2012   Zoster, Live 07/26/2012    Past Medical History:  Diagnosis Date   Cancer ([place])    Sleep apnea     Tobacco History: Social History   Tobacco Use  Smoking Status Never  Smokeless Tobacco Never   Counseling given: Not Answered   Continue to not smoke  Outpatient Encounter Medications as of 04/17/2022  Medication Sig   allopurinol (ZYLOPRIM) 300 MG tablet Take 150 mg by mouth daily.   amLODipine (NORVASC) 5 MG tablet Take 1 tablet (5 mg total) by mouth daily.   lisinopril (ZESTRIL) 10 MG tablet Take 10 mg by mouth daily.   rivaroxaban (XARELTO) 20 MG TABS tablet Take 1 tablet (20 mg total) by mouth daily with supper.   traZODone (DESYREL) 100 MG tablet Take 100 mg by mouth at bedtime as needed for sleep.   No facility-administered encounter medications on file as of 04/17/2022.     Review of Systems  Review of Systems  N/a Physical Exam  BP 136/72 (BP Location: Left Arm, Patient Position: Sitting, Cuff Size: Normal)   Pulse 68   Temp 98.4 F (36.9 C) (Oral)   Ht '5\' 10"'$  (1.778 m)   Wt 236 lb 6.4 oz (107.2 kg)   SpO2 95%   BMI 33.92  kg/m   Wt Readings from Last 5 Encounters:  04/17/22 236 lb 6.4 oz (107.2 kg)  02/28/22 235 lb (106.6 kg)  02/07/22 236 lb 9.6 oz (107.3 kg)  12/30/21 233 lb 6.4 oz (105.9 kg)  12/28/21 236 lb 12.4 oz (107.4 kg)    BMI Readings from Last 5 Encounters:  04/17/22 33.92 kg/m  02/28/22 33.72 kg/m  02/07/22 33.95 kg/m  12/30/21 33.49 kg/m  12/28/21 33.97 kg/m     Physical Exam General: Well-appearing, no acute distress Eyes: EOMI, icterus Neck: Supple, no JVP Pulmonary: Clear, normal work of breathing Cardiovascular: Regular rhythm, warm Abdomen: Nondistended, bowel sounds present MSK: No synovitis, joint effusion Neuro: Normal gait, no weakness Psych: Normal mood, full affect   Assessment & Plan:   Provoked submassive pulmonary embolism: Provoked in the setting of likely malignancy  undiagnosed.  Bilateral renal masses.  Troponin and BNP elevated.  Slightly increased RV to LV ratio on CT scan.  Unfortunately no echocardiogram was performed.  Underwent catheter directed lytics.  Symptoms improved.  Continue on Xarelto 20 mg daily.  Recommend indefinite anticoagulation.  TTE 03/2022, 3 months after event with normal RV size, function, estimated RVSP.  This is encouraging.  Length of anticoagulation pending resolution of underlying risk factor, presumed malignancy.  Renal masses: Undergoing work-up by urology.  Plan for surgery sounds September.  Do not think IVC filter is needed.  Recommend stopping Xarelto 48 hours prior to surgery.  If Lovenox bridge is desired I am happy to assist with this.  Would recommend resuming Xarelto shortly after surgery at urologist discretion, within the first day or 2.   Return in about 3 months (around 07/18/2022).   Lanier Clam, MD 04/17/2022

## 2022-04-17 NOTE — Patient Instructions (Signed)
Nice to see you again  The ultrasound demonstrated the blood clot does not have any significant ongoing stress to the heart.  This is good news.  I would continue the Xarelto for now.  My recommendation would be to stop it 48 hours prior to surgery.  If the surgery is scheduled on September 15, take your last dose the evening of September 12.  I do not think we need an IVC filter.  If needed, we could always bridge a short acting blood thinner in the 2 days prior to surgery.  If your urologist would prefer this please let me know and I can help coordinate.  I would recommend resuming the Xarelto a day or 2 after surgery at your urologist's discretion.  We will follow-up in clinic in a few weeks and discuss results of your surgery and try to help decide the length or amount of time you to stay on the blood thinner.  Return to clinic in 3 months or sooner if needed with Dr. Silas Flood

## 2022-04-23 ENCOUNTER — Other Ambulatory Visit (HOSPITAL_COMMUNITY): Payer: Self-pay

## 2022-04-25 DIAGNOSIS — N390 Urinary tract infection, site not specified: Secondary | ICD-10-CM | POA: Diagnosis not present

## 2022-04-25 DIAGNOSIS — D49511 Neoplasm of unspecified behavior of right kidney: Secondary | ICD-10-CM | POA: Diagnosis not present

## 2022-04-25 NOTE — Progress Notes (Signed)
COVID Vaccine Completed: yes x2  Date of COVID positive in last 90 days:  PCP - Mayra Neer, MD Cardiologist -   Chest x-ray - 12/30/21 Epic EKG - 12/26/21 Epic Stress Test -  ECHO - 04/09/22 Epic Cardiac Cath -  Pacemaker/ICD device last checked: Spinal Cord Stimulator:  Bowel Prep -   Sleep Study -  CPAP -   Fasting Blood Sugar - pre? Checks Blood Sugar _____ times a day  Blood Thinner Instructions: Xarelto, hold 2 days Aspirin Instructions: Last Dose:  Activity level:  Can go up a flight of stairs and perform activities of daily living without stopping and without symptoms of chest pain or shortness of breath.  Able to exercise without symptoms  Unable to go up a flight of stairs without symptoms of     Anesthesia review:   Patient denies shortness of breath, fever, cough and chest pain at PAT appointment  Patient verbalized understanding of instructions that were given to them at the PAT appointment. Patient was also instructed that they will need to review over the PAT instructions again at home before surgery.

## 2022-04-25 NOTE — Patient Instructions (Signed)
SURGICAL WAITING ROOM VISITATION Patients having surgery or a procedure may have no more than 2 support people in the waiting area - these visitors may rotate.   Children under the age of 79 must have an adult with them who is not the patient. If the patient needs to stay at the hospital during part of their recovery, the visitor guidelines for inpatient rooms apply. Pre-op nurse will coordinate an appropriate time for 1 support person to accompany patient in pre-op.  This support person may not rotate.    Please refer to the [facility] website for the visitor guidelines for Inpatients (after your surgery is over and you are in a regular room).    Your procedure is scheduled on: 05/09/22   Report to [facility] Main Entrance    Report to admitting at 10:15 AM   Call this number if you have problems the morning of surgery [phone]   Follow a clear liquid diet the day before surgery.   You may have the following liquids until 9:30 AM DAY OF SURGERY  Water Non-Citrus Juices (without pulp, NO RED) Carbonated Beverages Black Coffee (NO MILK/CREAM OR CREAMERS, sugar ok)  Clear Tea (NO MILK/CREAM OR CREAMERS, sugar ok) regular and decaf                             Plain Jell-O (NO RED)                                           Fruit ices (not with fruit pulp, NO RED)                                     Popsicles (NO RED)                                                               Sports drinks like Gatorade (NO RED)              FOLLOW BOWEL PREP AND ANY ADDITIONAL PRE OP INSTRUCTIONS YOU RECEIVED FROM YOUR SURGEON'S OFFICE!!!     Oral Hygiene is also important to reduce your risk of infection.                                    Remember - BRUSH YOUR TEETH THE MORNING OF SURGERY WITH YOUR REGULAR TOOTHPASTE   Do NOT smoke after Midnight   Take these medicines the morning of surgery with A SIP OF WATER: Amlodipine  These are anesthesia recommendations for holding your  anticoagulants.  Please contact your prescribing physician to confirm IF it is safe to hold your anticoagulants for this length of time.   Eliquis Apixaban   72 hours   Xarelto Rivaroxaban   72 hours  Plavix Clopidogrel   120 hours  Pletal Cilostazol   120 hours     Bring CPAP mask and tubing day of surgery.  You may not have any metal on your body including jewelry, and body piercing             Do not wear lotions, powders, cologne, or deodorant              Men may shave face and neck.   Do not bring valuables to the hospital. [place].   Bring small overnight bag day of surgery.   DO NOT [place]. PHARMACY WILL DISPENSE MEDICATIONS LISTED ON YOUR MEDICATION LIST TO YOU DURING YOUR ADMISSION [place]!              Please read over the following fact sheets you were given: IF YOU HAVE QUESTIONS ABOUT YOUR PRE-OP INSTRUCTIONS PLEASE CALL [facility] - Preparing for Surgery Before surgery, you can play an important role.  Because skin is not sterile, your skin needs to be as free of germs as possible.  You can reduce the number of germs on your skin by washing with CHG (chlorahexidine gluconate) soap before surgery.  CHG is an antiseptic cleaner which kills germs and bonds with the skin to continue killing germs even after washing. Please DO NOT use if you have an allergy to CHG or antibacterial soaps.  If your skin becomes reddened/irritated stop using the CHG and inform your nurse when you arrive at Short Stay. Do not shave (including legs and underarms) for at least 48 hours prior to the first CHG shower.  You may shave your face/neck.  Please follow these instructions carefully:  1.  Shower with CHG Soap the night before surgery and the  morning of surgery.  2.  If you choose to wash your hair, wash your hair first as usual with your  normal  shampoo.  3.  After you shampoo, rinse your hair and body thoroughly to remove the shampoo.                             4.  Use CHG as you would any other liquid soap.  You can apply chg directly to the skin and wash.  Gently with a scrungie or clean washcloth.  5.  Apply the CHG Soap to your body ONLY FROM THE NECK DOWN.   Do   not use on face/ open                           Wound or open sores. Avoid contact with eyes, ears mouth and   genitals (private parts).                       Wash face,  Genitals (private parts) with your normal soap.             6.  Wash thoroughly, paying special attention to the area where your    surgery  will be performed.  7.  Thoroughly rinse your body with warm water from the neck down.  8.  DO NOT shower/wash with your normal soap after using and rinsing off the CHG Soap.                9.  Pat yourself dry with a clean towel.  10.  Wear clean pajamas.            11.  Place clean sheets on your bed the night of your first shower and do not  sleep with pets. Day of Surgery : Do not apply any lotions/deodorants the morning of surgery.  Please wear clean clothes to the hospital/surgery center.  FAILURE TO FOLLOW THESE INSTRUCTIONS MAY RESULT IN THE CANCELLATION OF YOUR SURGERY  PATIENT SIGNATURE_________________________________  NURSE SIGNATURE__________________________________  ________________________________________________________________________  WHAT IS A BLOOD TRANSFUSION? Blood Transfusion Information  A transfusion is the replacement of blood or some of its parts. Blood is made up of multiple cells which provide different functions. Red blood cells carry oxygen and are used for blood loss replacement. White blood cells fight against infection. Platelets control bleeding. Plasma helps clot blood. Other blood products are available for specialized needs, such as hemophilia or other clotting disorders. BEFORE THE TRANSFUSION  Who  gives blood for transfusions?  Healthy volunteers who are fully evaluated to make sure their blood is safe. This is blood bank blood. Transfusion therapy is the safest it has ever been in the practice of medicine. Before blood is taken from a donor, a complete history is taken to make sure that person has no history of diseases nor engages in risky social behavior (examples are intravenous drug use or sexual activity with multiple partners). The donor's travel history is screened to minimize risk of transmitting infections, such as malaria. The donated blood is tested for signs of infectious diseases, such as HIV and hepatitis. The blood is then tested to be sure it is compatible with you in order to minimize the chance of a transfusion reaction. If you or a relative donates blood, this is often done in anticipation of surgery and is not appropriate for emergency situations. It takes many days to process the donated blood. RISKS AND COMPLICATIONS Although transfusion therapy is very safe and saves many lives, the main dangers of transfusion include:  Getting an infectious disease. Developing a transfusion reaction. This is an allergic reaction to something in the blood you were given. Every precaution is taken to prevent this. The decision to have a blood transfusion has been considered carefully by your caregiver before blood is given. Blood is not given unless the benefits outweigh the risks. AFTER THE TRANSFUSION Right after receiving a blood transfusion, you will usually feel much better and more energetic. This is especially true if your red blood cells have gotten low (anemic). The transfusion raises the level of the red blood cells which carry oxygen, and this usually causes an energy increase. The nurse administering the transfusion will monitor you carefully for complications. HOME CARE INSTRUCTIONS  No special instructions are needed after a transfusion. You may find your energy is better.  Speak with your caregiver about any limitations on activity for underlying diseases you may have. SEEK MEDICAL CARE IF:  Your condition is not improving after your transfusion. You develop redness or irritation at the intravenous (IV) site. SEEK IMMEDIATE MEDICAL CARE IF:  Any of the following symptoms occur over the next 12 hours: Shaking chills. You have a temperature by mouth above 102 F (38.9 C), not controlled by medicine. Chest, back, or muscle pain. People around you feel you are not acting correctly or are confused. Shortness of breath or difficulty breathing. Dizziness and fainting. You get a rash or develop hives. You have a decrease in urine output. Your urine turns a dark color or changes to pink,  red, or brown. Any of the following symptoms occur over the next 10 days: You have a temperature by mouth above 102 F (38.9 C), not controlled by medicine. Shortness of breath. Weakness after normal activity. The white part of the eye turns yellow (jaundice). You have a decrease in the amount of urine or are urinating less often. Your urine turns a dark color or changes to pink, red, or brown. Document Released: 08/08/2000 Document Revised: 11/03/2011 Document Reviewed: 03/27/2008 [facility] Patient Information 2014 [place], [place].  _______________________________________________________________________

## 2022-04-29 ENCOUNTER — Encounter (HOSPITAL_COMMUNITY): Payer: Self-pay

## 2022-04-29 ENCOUNTER — Encounter (HOSPITAL_COMMUNITY)
Admission: RE | Admit: 2022-04-29 | Discharge: 2022-04-29 | Disposition: A | Discharge status: Home / Self Care | Payer: Medicare HMO | Source: Ambulatory Visit | Attending: Urology | Admitting: Urology

## 2022-04-29 VITALS — BP 153/91 | HR 66 | Temp 97.9°F | Resp 16 | Ht 70.0 in

## 2022-04-29 DIAGNOSIS — Z86718 Personal history of other venous thrombosis and embolism: Secondary | ICD-10-CM | POA: Insufficient documentation

## 2022-04-29 DIAGNOSIS — G4730 Sleep apnea, unspecified: Secondary | ICD-10-CM | POA: Insufficient documentation

## 2022-04-29 DIAGNOSIS — N2889 Other specified disorders of kidney and ureter: Secondary | ICD-10-CM | POA: Insufficient documentation

## 2022-04-29 DIAGNOSIS — Z01818 Encounter for other preprocedural examination: Secondary | ICD-10-CM | POA: Insufficient documentation

## 2022-04-29 DIAGNOSIS — I2510 Atherosclerotic heart disease of native coronary artery without angina pectoris: Secondary | ICD-10-CM

## 2022-04-29 DIAGNOSIS — I10 Essential (primary) hypertension: Secondary | ICD-10-CM | POA: Diagnosis not present

## 2022-04-29 DIAGNOSIS — R7303 Prediabetes: Secondary | ICD-10-CM

## 2022-04-29 HISTORY — DX: Essential (primary) hypertension: I10

## 2022-04-29 HISTORY — DX: Pneumonia, unspecified organism: J18.9

## 2022-04-29 HISTORY — DX: Unspecified osteoarthritis, unspecified site: M19.90

## 2022-04-29 LAB — CBC
HCT: 49.4 % (ref 39.0–52.0)
Hemoglobin: 16.1 g/dL (ref 13.0–17.0)
MCH: 30.2 pg (ref 26.0–34.0)
MCHC: 32.6 g/dL (ref 30.0–36.0)
MCV: 92.7 fL (ref 80.0–100.0)
Platelets: 197 10*3/uL (ref 150–400)
RBC: 5.33 MIL/uL (ref 4.22–5.81)
RDW: 13.6 % (ref 11.5–15.5)
WBC: 5.5 10*3/uL (ref 4.0–10.5)
nRBC: 0 % (ref 0.0–0.2)

## 2022-04-29 LAB — BASIC METABOLIC PANEL
Anion gap: 5 (ref 5–15)
BUN: 22 mg/dL (ref 8–23)
CO2: 26 mmol/L (ref 22–32)
Calcium: 9.2 mg/dL (ref 8.9–10.3)
Chloride: 109 mmol/L (ref 98–111)
Creatinine, Ser: 1.18 mg/dL (ref 0.61–1.24)
GFR, Estimated: >60 mL/min (ref >60)
Glucose, Bld: 107 mg/dL — ABNORMAL HIGH (ref 70–99)
Potassium: 4.3 mmol/L (ref 3.5–5.1)
Sodium: 140 mmol/L (ref 135–145)

## 2022-04-29 LAB — HEMOGLOBIN A1C
Hgb A1c MFr Bld: 5.5 % (ref 4.8–5.6)
Mean Plasma Glucose: 111.15 mg/dL

## 2022-04-30 NOTE — Anesthesia Preprocedure Evaluation (Addendum)
Anesthesia Evaluation  Patient identified by MRN, date of birth, ID band Patient awake    Reviewed: Allergy & Precautions, NPO status , Patient's Chart, lab work & pertinent test results  Airway Mallampati: II  TM Distance: <3 FB Neck ROM: Full    Dental no notable dental hx.    Pulmonary sleep apnea , PE   Pulmonary exam normal breath sounds clear to auscultation       Cardiovascular hypertension, Pt. on medications Normal cardiovascular exam Rhythm:Regular Rate:Normal     Neuro/Psych negative neurological ROS  negative psych ROS   GI/Hepatic negative GI ROS, Neg liver ROS,   Endo/Other  negative endocrine ROS  Renal/GU negative Renal ROS  negative genitourinary   Musculoskeletal negative musculoskeletal ROS (+)   Abdominal   Peds negative pediatric ROS (+)  Hematology negative hematology ROS (+)   Anesthesia Other Findings   Reproductive/Obstetrics negative OB ROS                           Anesthesia Physical Anesthesia Plan  ASA: 3  Anesthesia Plan: General   Post-op Pain Management: Ofirmev IV (intra-op)*   Induction: Intravenous  PONV Risk Score and Plan: 2 and Ondansetron, Dexamethasone and Treatment may vary due to age or medical condition  Airway Management Planned: Oral ETT  Additional Equipment:   Intra-op Plan:   Post-operative Plan: Extubation in OR  Informed Consent: I have reviewed the patients History and Physical, chart, labs and discussed the procedure including the risks, benefits and alternatives for the proposed anesthesia with the patient or authorized representative who has indicated his/her understanding and acceptance.     Dental advisory given  Plan Discussed with: CRNA and Surgeon  Anesthesia Plan Comments: (See PAT note 04/29/2022)       Anesthesia Quick Evaluation

## 2022-04-30 NOTE — Progress Notes (Signed)
Anesthesia Chart Review   Case: [id number] Date/Time: 05/09/22 1215   Procedure: XI ROBOTIC ASSISTED LAPAROSCOPIC NEPHRECTOMY (Right) - ONLY NEEDS 180 MIN   Anesthesia type: General   Pre-op diagnosis: RIGHT RENAL MASS   Location: [place] 03 / [facility] ORS   Surgeons: Ceasar Mons, MD       DISCUSSION:76 y.o.n ever smoker with h/o sleep apnea, HTN, RLE DVT, PE, right renal mass scheduled for above procedure 05/09/2022 with Dr. Harrell Gave Lovena Neighbours.   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  Followed by pulmonology.  Pt last seen by pulmonology 04/17/2022. Per OV note pt to stop Xarelto 48 hours prior to surgery.   Anticipate pt can proceed with planned procedure barring acute status change.   VS: BP (!) 153/91   Pulse 66   Temp 36.6 C (Oral)   Resp 16   Ht '5\' 10"'$  (1.778 m)   SpO2 99%   BMI 33.92 kg/m   PROVIDERS: Mayra Neer, MD is PCP    LABS: Labs reviewed: Acceptable for surgery. (all labs ordered are listed, but only abnormal results are displayed)  Labs Reviewed  BASIC METABOLIC PANEL - Abnormal; Notable for the following components:      Result Value   Glucose, Bld 107 (*)    All other components within normal limits  HEMOGLOBIN A1C  CBC  TYPE AND SCREEN     IMAGES:   EKG:   CV: Echo 04/09/2022 1. Left ventricular ejection fraction, by estimation, is 60 to 65%. The  left ventricle has normal function. The left ventricle has no regional  wall motion abnormalities. Left ventricular diastolic parameters are  consistent with Grade II diastolic  dysfunction (pseudonormalization). The average left ventricular global  longitudinal strain is -23.4 %. The global longitudinal strain is normal.   2. Right ventricular systolic function is normal. The right ventricular  size is normal. Tricuspid regurgitation signal is inadequate for assessing  PA pressure.   3. Right atrial size was mildly dilated.   4. The mitral valve is normal in structure. Trivial  mitral valve  regurgitation. No evidence of mitral stenosis.   5. The aortic valve is tricuspid. Aortic valve regurgitation is not  visualized. No aortic stenosis is present.   6. Aortic dilatation noted. There is mild dilatation of the ascending  aorta, measuring 40 mm.   7. The inferior vena cava is normal in size with greater than 50%  respiratory variability, suggesting right atrial pressure of 3 mmHg.  Past Medical History:  Diagnosis Date   Arthritis    back   Cancer ([place])    Hypertension    Pneumonia    Sleep apnea     Past Surgical History:  Procedure Laterality Date   CATARACT EXTRACTION     IR ANGIOGRAM PULMONARY BILATERAL SELECTIVE  12/25/2021   IR INFUSION THROMBOL ARTERIAL INITIAL (MS)  12/25/2021   IR INFUSION THROMBOL ARTERIAL INITIAL (MS)  12/25/2021   IR RADIOLOGIST EVAL & MGMT  01/30/2022   IR THROMB F/U EVAL ART/VEN FINAL DAY (MS)  12/25/2021   IR US GUIDE VASC ACCESS RIGHT  12/25/2021   IR US GUIDE VASC ACCESS RIGHT  12/25/2021   MENISCUS REPAIR Right    ROTATOR CUFF REPAIR Right    skin cancer removal      MEDICATIONS:  amLODipine (NORVASC) 5 MG tablet   lisinopril (ZESTRIL) 10 MG tablet   Polyethyl Glycol-Propyl Glycol (LUBRICANT EYE DROPS) 0.4-0.3 % SOLN   rivaroxaban (XARELTO) 20 MG TABS  tablet   sodium chloride (NASAL MOIST) 0.65 % nasal spray   traZODone (DESYREL) 100 MG tablet   No current facility-administered medications for this encounter.     Konrad Felix Ward, PA-C [facility] Pre-Surgical Testing [phone]

## 2022-05-01 DIAGNOSIS — Z86711 Personal history of pulmonary embolism: Secondary | ICD-10-CM | POA: Diagnosis not present

## 2022-05-01 DIAGNOSIS — M109 Gout, unspecified: Secondary | ICD-10-CM | POA: Diagnosis not present

## 2022-05-01 DIAGNOSIS — D6869 Other thrombophilia: Secondary | ICD-10-CM | POA: Diagnosis not present

## 2022-05-01 DIAGNOSIS — N2889 Other specified disorders of kidney and ureter: Secondary | ICD-10-CM | POA: Diagnosis not present

## 2022-05-01 DIAGNOSIS — G4700 Insomnia, unspecified: Secondary | ICD-10-CM | POA: Diagnosis not present

## 2022-05-01 DIAGNOSIS — I10 Essential (primary) hypertension: Secondary | ICD-10-CM | POA: Diagnosis not present

## 2022-05-01 DIAGNOSIS — M5136 Other intervertebral disc degeneration, lumbar region: Secondary | ICD-10-CM | POA: Diagnosis not present

## 2022-05-09 ENCOUNTER — Inpatient Hospital Stay (HOSPITAL_COMMUNITY): Payer: Medicare HMO | Admitting: Physician Assistant

## 2022-05-09 ENCOUNTER — Other Ambulatory Visit: Payer: Self-pay

## 2022-05-09 ENCOUNTER — Encounter (HOSPITAL_COMMUNITY): Payer: Self-pay | Admitting: Urology

## 2022-05-09 ENCOUNTER — Encounter (HOSPITAL_COMMUNITY)
Admission: RE | Disposition: A | Discharge status: Home / Self Care | Payer: Self-pay | Source: Home / Self Care | Attending: Urology

## 2022-05-09 ENCOUNTER — Other Ambulatory Visit (HOSPITAL_COMMUNITY): Payer: Self-pay

## 2022-05-09 ENCOUNTER — Inpatient Hospital Stay (HOSPITAL_COMMUNITY): Payer: Medicare HMO | Admitting: Anesthesiology

## 2022-05-09 ENCOUNTER — Inpatient Hospital Stay (HOSPITAL_COMMUNITY)
Admission: RE | Admit: 2022-05-09 | Discharge: 2022-05-10 | DRG: 658 | Disposition: A | Discharge status: Home / Self Care | Payer: Medicare HMO | Attending: Urology | Admitting: Urology

## 2022-05-09 DIAGNOSIS — D3001 Benign neoplasm of right kidney: Principal | ICD-10-CM | POA: Diagnosis present

## 2022-05-09 DIAGNOSIS — Z7901 Long term (current) use of anticoagulants: Secondary | ICD-10-CM | POA: Diagnosis not present

## 2022-05-09 DIAGNOSIS — I10 Essential (primary) hypertension: Secondary | ICD-10-CM | POA: Diagnosis not present

## 2022-05-09 DIAGNOSIS — M1990 Unspecified osteoarthritis, unspecified site: Secondary | ICD-10-CM | POA: Diagnosis not present

## 2022-05-09 DIAGNOSIS — R140 Abdominal distension (gaseous): Secondary | ICD-10-CM | POA: Diagnosis not present

## 2022-05-09 DIAGNOSIS — N2889 Other specified disorders of kidney and ureter: Principal | ICD-10-CM | POA: Diagnosis present

## 2022-05-09 DIAGNOSIS — R7303 Prediabetes: Secondary | ICD-10-CM

## 2022-05-09 DIAGNOSIS — M109 Gout, unspecified: Secondary | ICD-10-CM | POA: Diagnosis not present

## 2022-05-09 DIAGNOSIS — G4730 Sleep apnea, unspecified: Secondary | ICD-10-CM

## 2022-05-09 DIAGNOSIS — I2699 Other pulmonary embolism without acute cor pulmonale: Secondary | ICD-10-CM | POA: Diagnosis not present

## 2022-05-09 DIAGNOSIS — D4101 Neoplasm of uncertain behavior of right kidney: Secondary | ICD-10-CM | POA: Diagnosis not present

## 2022-05-09 DIAGNOSIS — Z86711 Personal history of pulmonary embolism: Secondary | ICD-10-CM | POA: Diagnosis not present

## 2022-05-09 HISTORY — PX: ROBOT ASSISTED LAPAROSCOPIC NEPHRECTOMY: SHX5140

## 2022-05-09 LAB — HEMOGLOBIN AND HEMATOCRIT, BLOOD
HCT: 46.9 % (ref 39.0–52.0)
Hemoglobin: 15.1 g/dL (ref 13.0–17.0)

## 2022-05-09 LAB — GLUCOSE, CAPILLARY: Glucose-Capillary: 129 mg/dL — ABNORMAL HIGH (ref 70–99)

## 2022-05-09 LAB — TYPE AND SCREEN
ABO/RH(D): A POS
Antibody Screen: NEGATIVE

## 2022-05-09 LAB — ABO/RH: ABO/RH(D): A POS

## 2022-05-09 SURGERY — NEPHRECTOMY, RADICAL, ROBOT-ASSISTED, LAPAROSCOPIC, ADULT
Anesthesia: General | Site: Abdomen | Laterality: Right

## 2022-05-09 MED ORDER — PHENYLEPHRINE HCL-NACL 20-0.9 MG/250ML-% IV SOLN
INTRAVENOUS | Status: AC
  Filled 2022-05-09: qty 250

## 2022-05-09 MED ORDER — OXYCODONE HCL 5 MG/5ML PO SOLN: 5.0000 mg | Freq: Once | ORAL | Status: AC | PRN

## 2022-05-09 MED ORDER — AMLODIPINE BESYLATE 10 MG PO TABS
5.0000 mg | ORAL_TABLET | Freq: Every day | ORAL | Status: DC
  Administered 2022-05-10: 5 mg via ORAL
  Filled 2022-05-09: qty 1

## 2022-05-09 MED ORDER — DOCUSATE SODIUM 100 MG PO CAPS: 100.0000 mg | ORAL_CAPSULE | Freq: Two times a day (BID) | ORAL | Status: DC

## 2022-05-09 MED ORDER — LIDOCAINE HCL (PF) 2 % IJ SOLN
INTRAMUSCULAR | Status: AC
  Filled 2022-05-09: qty 5

## 2022-05-09 MED ORDER — BUPIVACAINE LIPOSOME 1.3 % IJ SUSP
INTRAMUSCULAR | Status: DC | PRN
  Administered 2022-05-09: 20 mL

## 2022-05-09 MED ORDER — ONDANSETRON HCL 4 MG/2ML IJ SOLN: 4.0000 mg | INTRAMUSCULAR | Status: DC | PRN

## 2022-05-09 MED ORDER — HYDROMORPHONE HCL 1 MG/ML IJ SOLN
INTRAMUSCULAR | Status: AC
  Administered 2022-05-09: 0.5 mg via INTRAVENOUS
  Filled 2022-05-09: qty 1

## 2022-05-09 MED ORDER — ONDANSETRON HCL 4 MG/2ML IJ SOLN: 4.0000 mg | Freq: Once | INTRAMUSCULAR | Status: DC | PRN

## 2022-05-09 MED ORDER — LACTATED RINGERS IR SOLN
Status: DC | PRN
  Administered 2022-05-09: 1000 mL

## 2022-05-09 MED ORDER — SODIUM CHLORIDE 0.45 % IV SOLN: INTRAVENOUS | Status: DC

## 2022-05-09 MED ORDER — ACETAMINOPHEN 10 MG/ML IV SOLN
1000.0000 mg | Freq: Four times a day (QID) | INTRAVENOUS | Status: AC
  Administered 2022-05-09 – 2022-05-10 (×4): 1000 mg via INTRAVENOUS
  Filled 2022-05-09 (×4): qty 100

## 2022-05-09 MED ORDER — CIPROFLOXACIN IN D5W 400 MG/200ML IV SOLN
400.0000 mg | Freq: Two times a day (BID) | INTRAVENOUS | Status: DC
  Administered 2022-05-09: 400 mg via INTRAVENOUS
  Filled 2022-05-09: qty 200

## 2022-05-09 MED ORDER — CHLORHEXIDINE GLUCONATE 0.12 % MT SOLN
15.0000 mL | Freq: Once | OROMUCOSAL | Status: AC
  Administered 2022-05-09: 15 mL via OROMUCOSAL

## 2022-05-09 MED ORDER — OXYCODONE HCL 5 MG/5ML PO SOLN
ORAL | Status: AC
  Administered 2022-05-09: 5 mg via ORAL
  Filled 2022-05-09: qty 5

## 2022-05-09 MED ORDER — LIDOCAINE HCL (PF) 2 % IJ SOLN
INTRAMUSCULAR | Status: DC | PRN
  Administered 2022-05-09: 1.5 mg/kg/h via INTRADERMAL

## 2022-05-09 MED ORDER — TRIPLE ANTIBIOTIC 3.5-400-5000 EX OINT: 1.0000 | TOPICAL_OINTMENT | Freq: Three times a day (TID) | CUTANEOUS | Status: DC | PRN

## 2022-05-09 MED ORDER — GLYCOPYRROLATE 0.2 MG/ML IJ SOLN
INTRAMUSCULAR | Status: AC
  Filled 2022-05-09: qty 1

## 2022-05-09 MED ORDER — ROCURONIUM BROMIDE 100 MG/10ML IV SOLN
INTRAVENOUS | Status: DC | PRN
  Administered 2022-05-09: 100 mg via INTRAVENOUS
  Administered 2022-05-09 (×2): 20 mg via INTRAVENOUS

## 2022-05-09 MED ORDER — HYDROMORPHONE HCL 1 MG/ML IJ SOLN
0.2500 mg | INTRAMUSCULAR | Status: DC | PRN
  Administered 2022-05-09: 0.5 mg via INTRAVENOUS

## 2022-05-09 MED ORDER — SUGAMMADEX SODIUM 500 MG/5ML IV SOLN
INTRAVENOUS | Status: DC | PRN
  Administered 2022-05-09: 300 mg via INTRAVENOUS

## 2022-05-09 MED ORDER — HYDROCODONE-ACETAMINOPHEN 5-325 MG PO TABS
1.0000 | ORAL_TABLET | Freq: Four times a day (QID) | ORAL | 0 refills | Status: DC | PRN
  Filled 2022-05-09: qty 20, 3d supply, fill #0

## 2022-05-09 MED ORDER — BUPIVACAINE LIPOSOME 1.3 % IJ SUSP
INTRAMUSCULAR | Status: AC
  Filled 2022-05-09: qty 20

## 2022-05-09 MED ORDER — PROPOFOL 10 MG/ML IV BOLUS
INTRAVENOUS | Status: AC
  Filled 2022-05-09: qty 20

## 2022-05-09 MED ORDER — HYOSCYAMINE SULFATE 0.125 MG SL SUBL: 0.1250 mg | SUBLINGUAL_TABLET | SUBLINGUAL | Status: DC | PRN

## 2022-05-09 MED ORDER — ORAL CARE MOUTH RINSE: 15.0000 mL | Freq: Once | OROMUCOSAL | Status: AC

## 2022-05-09 MED ORDER — SODIUM CHLORIDE (PF) 0.9 % IJ SOLN
INTRAMUSCULAR | Status: DC | PRN
  Administered 2022-05-09: 20 mL

## 2022-05-09 MED ORDER — PHENYLEPHRINE HCL-NACL 20-0.9 MG/250ML-% IV SOLN
INTRAVENOUS | Status: DC | PRN
  Administered 2022-05-09: 40 ug/min via INTRAVENOUS

## 2022-05-09 MED ORDER — FENTANYL CITRATE (PF) 100 MCG/2ML IJ SOLN
INTRAMUSCULAR | Status: DC | PRN
  Administered 2022-05-09 (×2): 50 ug via INTRAVENOUS

## 2022-05-09 MED ORDER — DOCUSATE SODIUM 100 MG PO CAPS
100.0000 mg | ORAL_CAPSULE | Freq: Two times a day (BID) | ORAL | Status: DC
  Administered 2022-05-09 – 2022-05-10 (×2): 100 mg via ORAL
  Filled 2022-05-09 (×2): qty 1

## 2022-05-09 MED ORDER — ONDANSETRON HCL 4 MG/2ML IJ SOLN
INTRAMUSCULAR | Status: AC
  Filled 2022-05-09: qty 2

## 2022-05-09 MED ORDER — HYDROMORPHONE HCL 1 MG/ML IJ SOLN
0.5000 mg | INTRAMUSCULAR | Status: DC | PRN
  Administered 2022-05-09: 1 mg via INTRAVENOUS
  Filled 2022-05-09: qty 1

## 2022-05-09 MED ORDER — POLYETHYLENE GLYCOL 3350 17 G PO PACK
17.0000 g | PACK | Freq: Every day | ORAL | Status: DC
Start: 2022-05-09 — End: 2022-05-09

## 2022-05-09 MED ORDER — PROPOFOL 10 MG/ML IV BOLUS
INTRAVENOUS | Status: DC | PRN
  Administered 2022-05-09: 120 mg via INTRAVENOUS

## 2022-05-09 MED ORDER — SODIUM CHLORIDE (PF) 0.9 % IJ SOLN
INTRAMUSCULAR | Status: AC
  Filled 2022-05-09: qty 20

## 2022-05-09 MED ORDER — STERILE WATER FOR IRRIGATION IR SOLN
Status: DC | PRN
  Administered 2022-05-09: 1000 mL

## 2022-05-09 MED ORDER — LACTATED RINGERS IV SOLN: INTRAVENOUS | Status: DC

## 2022-05-09 MED ORDER — DIPHENHYDRAMINE HCL 50 MG/ML IJ SOLN: 12.5000 mg | Freq: Four times a day (QID) | INTRAMUSCULAR | Status: DC | PRN

## 2022-05-09 MED ORDER — LIDOCAINE HCL (CARDIAC) PF 100 MG/5ML IV SOSY
PREFILLED_SYRINGE | INTRAVENOUS | Status: DC | PRN
  Administered 2022-05-09: 100 mg via INTRAVENOUS

## 2022-05-09 MED ORDER — OXYCODONE HCL 5 MG PO TABS: 5.0000 mg | ORAL_TABLET | Freq: Once | ORAL | Status: AC | PRN

## 2022-05-09 MED ORDER — DEXAMETHASONE SODIUM PHOSPHATE 10 MG/ML IJ SOLN
INTRAMUSCULAR | Status: AC
  Filled 2022-05-09: qty 1

## 2022-05-09 MED ORDER — LACTATED RINGERS IV SOLN: INTRAVENOUS | Status: DC | PRN

## 2022-05-09 MED ORDER — ONDANSETRON HCL 4 MG/2ML IJ SOLN
INTRAMUSCULAR | Status: DC | PRN
  Administered 2022-05-09: 4 mg via INTRAVENOUS

## 2022-05-09 MED ORDER — MIDAZOLAM HCL 5 MG/5ML IJ SOLN
INTRAMUSCULAR | Status: DC | PRN
  Administered 2022-05-09 (×2): 1 mg via INTRAVENOUS

## 2022-05-09 MED ORDER — MIDAZOLAM HCL 2 MG/2ML IJ SOLN
INTRAMUSCULAR | Status: AC
  Filled 2022-05-09: qty 2

## 2022-05-09 MED ORDER — DIPHENHYDRAMINE HCL 12.5 MG/5ML PO ELIX: 12.5000 mg | ORAL_SOLUTION | Freq: Four times a day (QID) | ORAL | Status: DC | PRN

## 2022-05-09 MED ORDER — OXYCODONE HCL 5 MG PO TABS
5.0000 mg | ORAL_TABLET | ORAL | Status: DC | PRN
  Administered 2022-05-09: 5 mg via ORAL
  Filled 2022-05-09: qty 1

## 2022-05-09 MED ORDER — ROCURONIUM BROMIDE 10 MG/ML (PF) SYRINGE
PREFILLED_SYRINGE | INTRAVENOUS | Status: AC
  Filled 2022-05-09: qty 10

## 2022-05-09 MED ORDER — PROMETHAZINE HCL 12.5 MG PO TABS
12.5000 mg | ORAL_TABLET | ORAL | 0 refills | Status: DC | PRN
  Filled 2022-05-09: qty 10, 2d supply, fill #0

## 2022-05-09 MED ORDER — GLYCOPYRROLATE 0.2 MG/ML IJ SOLN
INTRAMUSCULAR | Status: DC | PRN
  Administered 2022-05-09: .2 mg via INTRAVENOUS

## 2022-05-09 SURGICAL SUPPLY — 65 items
ADH SKN CLS APL DERMABOND .7 (GAUZE/BANDAGES/DRESSINGS) ×1
APL PRP STRL LF DISP 70% ISPRP (MISCELLANEOUS) ×1
BAG COUNTER SPONGE SURGICOUNT (BAG) IMPLANT
BAG LAPAROSCOPIC 12 15 PORT 16 (BASKET) ×2 IMPLANT
BAG RETRIEVAL 12/15 (BASKET) ×1
BAG SPNG CNTER NS LX DISP (BAG)
CHLORAPREP W/TINT 26 (MISCELLANEOUS) ×2 IMPLANT
CLIP LIGATING HEM O LOK PURPLE (MISCELLANEOUS) ×2 IMPLANT
CLIP LIGATING HEMO LOK XL GOLD (MISCELLANEOUS) ×2 IMPLANT
CLIP LIGATING HEMO O LOK GREEN (MISCELLANEOUS) IMPLANT
COVER SURGICAL LIGHT HANDLE (MISCELLANEOUS) ×2 IMPLANT
COVER TIP SHEARS 8 DVNC (MISCELLANEOUS) ×2 IMPLANT
COVER TIP SHEARS 8MM DA VINCI (MISCELLANEOUS) ×1
CUTTER ECHEON FLEX ENDO 45 340 (ENDOMECHANICALS) IMPLANT
DERMABOND ADVANCED .7 DNX12 (GAUZE/BANDAGES/DRESSINGS) ×2 IMPLANT
DRAPE ARM DVNC X/XI (DISPOSABLE) ×8 IMPLANT
DRAPE COLUMN DVNC XI (DISPOSABLE) ×2 IMPLANT
DRAPE DA VINCI XI ARM (DISPOSABLE) ×4
DRAPE DA VINCI XI COLUMN (DISPOSABLE) ×1
DRAPE INCISE IOBAN 66X45 STRL (DRAPES) ×2 IMPLANT
DRAPE SHEET LG 3/4 BI-LAMINATE (DRAPES) ×2 IMPLANT
ELECT PENCIL ROCKER SW 15FT (MISCELLANEOUS) ×2 IMPLANT
ELECT REM PT RETURN 15FT ADLT (MISCELLANEOUS) ×2 IMPLANT
GAUZE 4X4 16PLY ~~LOC~~+RFID DBL (SPONGE) ×2 IMPLANT
GLOVE BIO SURGEON STRL SZ 6.5 (GLOVE) ×2 IMPLANT
GLOVE BIOGEL PI IND STRL 8 (GLOVE) ×2 IMPLANT
GLOVE SURG LX STRL 7.5 STRW (GLOVE) ×4 IMPLANT
GOWN SRG XL LVL 4 BRTHBL STRL (GOWNS) ×2 IMPLANT
GOWN STRL NON-REIN XL LVL4 (GOWNS) ×1
GOWN STRL REUS W/ TWL LRG LVL3 (GOWN DISPOSABLE) ×2 IMPLANT
GOWN STRL REUS W/ TWL XL LVL3 (GOWN DISPOSABLE) ×4 IMPLANT
GOWN STRL REUS W/TWL LRG LVL3 (GOWN DISPOSABLE) ×1
GOWN STRL REUS W/TWL XL LVL3 (GOWN DISPOSABLE) ×2
HOLDER FOLEY CATH W/STRAP (MISCELLANEOUS) ×2 IMPLANT
IRRIG SUCT STRYKERFLOW 2 WTIP (MISCELLANEOUS) ×1
IRRIGATION SUCT STRKRFLW 2 WTP (MISCELLANEOUS) ×2 IMPLANT
KIT BASIN OR (CUSTOM PROCEDURE TRAY) ×2 IMPLANT
KIT TURNOVER KIT A (KITS) IMPLANT
MARKER SKIN DUAL TIP RULER LAB (MISCELLANEOUS) ×2 IMPLANT
NDL INSUFFLATION 14GA 120MM (NEEDLE) ×2 IMPLANT
NEEDLE INSUFFLATION 14GA 120MM (NEEDLE) ×1 IMPLANT
PROTECTOR NERVE ULNAR (MISCELLANEOUS) ×4 IMPLANT
RELOAD STAPLE 45 2.6 WHT THIN (STAPLE) IMPLANT
SCISSORS LAP 5X45 EPIX DISP (ENDOMECHANICALS) ×2 IMPLANT
SEAL CANN UNIV 5-8 DVNC XI (MISCELLANEOUS) ×6 IMPLANT
SEAL XI 5MM-8MM UNIVERSAL (MISCELLANEOUS) ×3
SET TUBE SMOKE EVAC HIGH FLOW (TUBING) ×2 IMPLANT
SOLUTION ELECTROLUBE (MISCELLANEOUS) ×2 IMPLANT
SPIKE FLUID TRANSFER (MISCELLANEOUS) ×2 IMPLANT
STAPLE RELOAD 45 WHT (STAPLE) ×2 IMPLANT
STAPLE RELOAD 45MM WHITE (STAPLE) ×2
SUT MNCRL AB 4-0 PS2 18 (SUTURE) ×4 IMPLANT
SUT PDS AB 0 CT1 36 (SUTURE) ×4 IMPLANT
SUT VIC AB 0 CT1 27 (SUTURE)
SUT VIC AB 0 CT1 27XBRD ANTBC (SUTURE) IMPLANT
SUT VIC AB 0 CT1 36 (SUTURE) IMPLANT
SUT VIC AB 2-0 SH 27 (SUTURE) ×1
SUT VIC AB 2-0 SH 27X BRD (SUTURE) IMPLANT
TOWEL OR 17X26 10 PK STRL BLUE (TOWEL DISPOSABLE) ×2 IMPLANT
TOWEL OR NON WOVEN STRL DISP B (DISPOSABLE) ×2 IMPLANT
TRAY FOLEY MTR SLVR 16FR STAT (SET/KITS/TRAYS/PACK) ×2 IMPLANT
TRAY LAPAROSCOPIC (CUSTOM PROCEDURE TRAY) ×2 IMPLANT
TROCAR ADV FIXATION 12X100MM (TROCAR) ×2 IMPLANT
TROCAR Z-THREAD OPTICAL 5X100M (TROCAR) IMPLANT
WATER STERILE IRR 1000ML POUR (IV SOLUTION) ×2 IMPLANT

## 2022-05-09 NOTE — Anesthesia Procedure Notes (Signed)
Procedure Name: Intubation Date/Time: 05/09/2022 12:58 PM  Performed by: Garrel Ridgel, CRNAPre-anesthesia Checklist: Patient identified, Emergency Drugs available, Suction available and Patient being monitored Patient Re-evaluated:Patient Re-evaluated prior to induction Oxygen Delivery Method: Circle system utilized Preoxygenation: Pre-oxygenation with 100% oxygen Induction Type: IV induction Ventilation: Mask ventilation without difficulty Laryngoscope Size: Mac and 4 Grade View: Grade II Tube type: Oral Tube size: 7.5 mm Number of attempts: 1 Airway Equipment and Method: Stylet and Oral airway Placement Confirmation: ETT inserted through vocal cords under direct vision, positive ETCO2 and breath sounds checked- equal and bilateral Secured at: 22 cm Tube secured with: Tape Dental Injury: Teeth and Oropharynx as per pre-operative assessment

## 2022-05-09 NOTE — Op Note (Signed)
Operative Note  Preoperative diagnosis:  1.  7 cm right renal mass 2.  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  7 cm right renal mass 2.  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  Robot-assisted laparoscopic right radical nephrectomy (adrenal sparing  Surgeon: Ellison Hughs, MD  Assistants: 1.  Debbrah Alar, PA-C An assistant was required for this surgical procedure.  The duties of the assistant included but were not limited to suctioning, passing suture, camera manipulation, retraction.  This procedure would not be able to be performed without an Environmental consultant.  2.  Lynnae Prude, MD PGY4  Anesthesia:  General  Complications:  None  EBL: 140 mL  Specimens: 1.  Right kidney  Drains/Catheters: 1.  Foley catheter  Intraoperative findings:   Grossly negative surgical margins Right renal hilum was hemostatic following staple ligation  Indication:  Stanley Juarez is a 77 y.o. 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  He has been consented for the above procedures, voices understanding and wishes to proceed.  Description of procedure:  After informed consent was signed, the patient was taken back to the operating room and properly anesthetized.  The patient was then placed in the left lateral decubitus position with all pressure points padded.  The abdomen was then prepped and draped in the usual sterile fashion.  A time-out was then performed.     An 8 mm incision was then made lateral to the right rectus muscle at the level of the right 12th rib.  A Veress needle was then used to access the abdominal cavity.  A saline drop test showed no signs of obstruction and aspiration of the Veress needle revealed no blood or sucus.  The abdominal cavity was then  insufflated to 15 mmHg.  An 8 mm robotic trocar was then atraumatically inserted into the abdominal cavity.  The robotic camera was then inserted through the port and inspection of the abdominal cavity revealed no evidence of adjacent organ or vessel injury. We then placed three additional 8 mm robotic ports and a 12 mm assistant port  in such as fashion as to triangulate the right renal hilum.  The robot was then docked into postion.   The white line of [place] along the ascending colon was then incised, allowing [place] to reflect the colon medially and expose the anterior surface of the right kidney.  The duodenum was then Kocherized medially, which abruptly led [place] to the identification of the inferior vena cava.    Once the colon was adequately mobilized, we moved to the lower pole and identified the gonadal vein and ureter.  The gonadal vein was then left running parallel to the vena cava and the right ureter was reflected anteriorly.  Using cautious cautery, the overlying perihilar attachments were then released.  This yielded visualization of the renal hilum, which included a single right renal vein and a single right renal artery.  The perilymphatic tissue surrounding the right renal artery were carefully released so that the right renal artery was fully encircled.    A 45 mm powered endovascular stapler was then used to ligate the right renal vein.  The right renal artery had an early bifurcation and Hem-o-lok clips were utilized for ligation.  Hemoclips  were then applied to the proximal aspects of the right ureter, which was then sharply incised.  The remaining perinephric attachments were then incised using electrocautery. Reinspection of the right retroperitoneal space revealed excellent hemostasis. Once the right kidney was fully mobile, it was placed in an Endo Catch bag and left in the abdominal cavity.   The 12 mm midline assistant port was then closed using the [employer] technique with a 0  Vicryl suture.  A right lower quadrant Gibson incision was then made in the right kidney was removed within the Endo Catch bag.  The fascia of the external and internal oblique were then closed with a running 0 PDS suture.  The skin incisions were then closed using 4-0 Monocryl.  Dermabond was applied to all skin incisions.  Plan: Monitor on the floor overnight.

## 2022-05-09 NOTE — Transfer of Care (Signed)
Immediate Anesthesia Transfer of Care Note  Patient: Stanley Juarez  Procedure(s) Performed: XI ROBOTIC ASSISTED LAPAROSCOPIC NEPHRECTOMY (Right: Abdomen)  Patient Location: PACU  Anesthesia Type:General  Level of Consciousness: sedated  Airway & Oxygen Therapy: Patient connected to face mask  Post-op Assessment: Report given to RN and Post -op Vital signs reviewed and stable  Post vital signs: Reviewed and stable  Last Vitals:  Vitals Value Taken Time  BP 124/74 05/09/22 1618  Temp    Pulse 52 05/09/22 1620  Resp 22 05/09/22 1620  SpO2 99 % 05/09/22 1620  Vitals shown include unvalidated device data.  Last Pain:  Vitals:   05/09/22 1106  TempSrc:   PainSc: 0-No pain         Complications: No notable events documented.

## 2022-05-09 NOTE — Anesthesia Postprocedure Evaluation (Signed)
Anesthesia Post Note  Patient: Stanley Juarez  Procedure(s) Performed: XI ROBOTIC ASSISTED LAPAROSCOPIC NEPHRECTOMY (Right: Abdomen)     Patient location during evaluation: PACU Anesthesia Type: General Level of consciousness: sedated and patient cooperative Pain management: pain level controlled Vital Signs Assessment: post-procedure vital signs reviewed and stable Respiratory status: spontaneous breathing Cardiovascular status: stable Anesthetic complications: no   No notable events documented.  Last Vitals:  Vitals:   05/09/22 1715 05/09/22 1743  BP: 112/66 133/81  Pulse: (!) 51 (!) 56  Resp: 12 16  Temp:  (!) 36.4 C  SpO2: 98% 97%    Last Pain:  Vitals:   05/09/22 1754  TempSrc:   PainSc: [place]

## 2022-05-09 NOTE — H&P (Signed)
PRE-OP H&P  Office Visit Report     04/25/2022   --------------------------------------------------------------------------------   Stanley Juarez  MRN: [id number]  DOB: 1946-06-26, 76 year old Male  SSN:    PRIMARY CARE:  Kimberlee D. Brigitte Pulse, MD  REFERRING:  Glena Norfolk. Lovena Neighbours, MD  PROVIDER:  Ellison Hughs, M.D.  TREATING:  Jiles Crocker, NP  LOCATION:  [facility] [phone]     --------------------------------------------------------------------------------   CC/HPI: Bilateral renal masses   Mr. Stanley Juarez is a 76 year old male who was diagnosed with a 7 cm right renal mass along with a 2.5 cm left renal mass with features concerning for renal cell carcinoma. The masses were initially discovered during CT angio of the chest abdomen and pelvis on 12/24/2021 during an evaluation of acute respiratory failure. He was found to have a saddle pulmonary embolus along with a right lower extremity DVT at that time. He had thrombolysis of his pulmonary emboli and is currently on Xarelto. He notes no prior personal/family history of GU malignancies. No prior abdominal surgeries. His last serum creatinine was 1.09 with an EGFR >60.   03/06/22: The patient met with Dr. Alen Blew with hematology/oncology who recommends that he continue full anticoagulation until August before proceeding with a nephrectomy. He also recommended that the patient see pulmonary medicine for clearance and consideration for a vena cava filter. He has done well over the past several months and denies any shortness of breath, flank pain, dysuria or hematuria. Vascular ultrasound from 01/30/2022 showed persistent right lower extremity DVT involving the right popliteal vein.   04/25/2022: Patient with above-noted history. Here today for preoperative appointment prior to undergoing right robotic radical nephrectomy on 9/15 with Dr. Lovena Neighbours. He has since met with pulmonology who did not think an IVC filter was needed. They  recommended stopping Xarelto approximately 48 hours prior to the upcoming procedure.   Overall patient doing well. He had a GI illness earlier in August but that is since resolved. Denies any changes in past medical history, prescription medications taken on daily basis, no interval surgical or procedural intervention. Patient continues to void at his baseline with stable grossly nonbothersome symptomology. Denies any dysuria, gross hematuria. He denies any recent fevers or chills, nausea or vomiting, not having any chest pain, shortness of breath or other bothersome respiratory symptoms.     ALLERGIES: Penicillin Perisone    MEDICATIONS: Allopurinol  Lisinopril 10 mg tablet  Amlodipine Besylate 5 mg tablet  Trazodone Hcl  Xarelto 20 mg tablet     GU PSH: No GU PSH    NON-GU PSH: Rotator cuff surgery     GU PMH: Left renal neoplasm - 03/06/2022, - 01/30/2022 Right renal neoplasm - 03/06/2022, - 01/30/2022      PMH Notes: Blood clot in lung/PE   NON-GU PMH: Chronic pulmonary embolism - 03/06/2022, - 01/30/2022 DVT, History - 03/06/2022, - 01/30/2022 Arthritis Gout Hypertension Sleep Apnea    FAMILY HISTORY: No Family History    SOCIAL HISTORY: Marital Status: Married Preferred Language: English; Ethnicity: Not Hispanic Or Latino; Race: White Current Smoking Status: Patient has never smoked.   Tobacco Use Assessment Completed: Used Tobacco in last 30 days? Has never drank.  Drinks 1 caffeinated drink per day.    REVIEW OF SYSTEMS:    GU Review Male:   Patient denies frequent urination, hard to postpone urination, burning/ pain with urination, get up at night to urinate, leakage of urine, stream starts and stops, trouble starting your stream, have to strain  to urinate , erection problems, and penile pain.  Gastrointestinal (Upper):   Patient denies vomiting, indigestion/ heartburn, and nausea.  Gastrointestinal (Lower):   Patient denies diarrhea and constipation.  Constitutional:    Patient denies fever, night sweats, weight loss, and fatigue.  Skin:   Patient denies skin rash/ lesion and itching.  Eyes:   Patient denies blurred vision and double vision.  Ears/ Nose/ Throat:   Patient denies sore throat and sinus problems.  Hematologic/Lymphatic:   Patient denies swollen glands and easy bruising.  Cardiovascular:   Patient denies leg swelling and chest pains.  Respiratory:   Patient denies cough and shortness of breath.  Endocrine:   Patient denies excessive thirst.  Musculoskeletal:   Patient denies back pain and joint pain.  Neurological:   Patient denies headaches and dizziness.  Psychologic:   Patient denies depression and anxiety.   VITAL SIGNS:      04/25/2022 10:22 AM  Weight 231.7 lb / 105.1 kg  Height 70 in / 177.8 cm  BP 148/78 mmHg  Heart Rate 60 /min  Temperature 98.2 F / 36.7 C  BMI 33.2 kg/m   MULTI-SYSTEM PHYSICAL EXAMINATION:    Constitutional: Well-nourished. No physical deformities. Normally developed. Good grooming.  Neck: Neck symmetrical, not swollen. Normal tracheal position.  Respiratory: No labored breathing, no use of accessory muscles.   Cardiovascular: Normal temperature, normal extremity pulses, no swelling, no varicosities.  Neurologic / Psychiatric: Oriented to time, oriented to place, oriented to person. No depression, no anxiety, no agitation.  Gastrointestinal: Obese abdomen. No mass, no tenderness, no rigidity.   Musculoskeletal: Normal gait and station of head and neck.     Complexity of Data:  Source Of History:  Patient, Family/Caregiver, Medical Record Summary  Records Review:   Previous Doctor Records, Previous Hospital Records, Previous Patient Records  Urine Test Review:   Urinalysis  X-Ray Review: C.T. Abdomen/Pelvis: Reviewed Films. Reviewed Report.     04/25/22  Urinalysis  Urine Appearance Clear   Urine Color Yellow   Urine Glucose Neg mg/dL  Urine Bilirubin Neg mg/dL  Urine Ketones Neg mg/dL  Urine  Specific Gravity 1.010   Urine Blood Neg ery/uL  Urine pH <=5.0   Urine Protein Neg mg/dL  Urine Urobilinogen 0.2 mg/dL  Urine Nitrites Neg   Urine Leukocyte Esterase Neg leu/uL   PROCEDURES:          Urinalysis Dipstick Dipstick Cont'd  Color: Yellow Bilirubin: Neg mg/dL  Appearance: Clear Ketones: Neg mg/dL  Specific Gravity: 1.010 Blood: Neg ery/uL  pH: <=5.0 Protein: Neg mg/dL  Glucose: Neg mg/dL Urobilinogen: 0.2 mg/dL    Nitrites: Neg    Leukocyte Esterase: Neg leu/uL    ASSESSMENT:      ICD-10 Details  1 GU:   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 Urine culture sent today to serve as a baseline. He  will proceed with previously scheduled right robotic nephrectomy on 9/15 with his urologist.    -I reviewed imaging results and films with the patient personally. We discussed that the mass in question has features concerning for malignancy. I explained the natural history of presumed renal cell carcinoma. I reviewed the AUA guidelines for evaluation and treatment of renal masses. The options of active surveillance, in situ tumor ablation, partial and radical nephrectomy was discussed. The risks of robot-assisted partial vs radical laparoscopic RIGHT nephrectomy were discussed in detail including but not limited to: negative pathology, open conversion, infection of the skin/abdominal cavity, VTE, MI/CVA, lymphatic leak, injury to adjacent solid/hollow viscus organs,  bleeding requiring a blood transfusion, catastrophic bleeding, hernia formation and other imponderables. The patient voices understanding and wishes to proceed.

## 2022-05-09 NOTE — Discharge Instructions (Signed)

## 2022-05-09 NOTE — Anesthesia Postprocedure Evaluation (Incomplete)
Anesthesia Post Note  Patient: Stanley Juarez  Procedure(s) Performed: XI ROBOTIC ASSISTED LAPAROSCOPIC NEPHRECTOMY (Right: Abdomen)     Anesthesia Post Evaluation No notable events documented.  Last Vitals:  Vitals:   05/09/22 1021 05/09/22 1028  BP: (!) 162/90 (!) 143/88  Pulse: 82   Resp: 16   Temp: 36.4 C   SpO2: 98%     Last Pain:  Vitals:   05/09/22 1106  TempSrc:   PainSc: 0-No pain                 Karaline Buresh,STEVE

## 2022-05-10 ENCOUNTER — Encounter (HOSPITAL_COMMUNITY): Payer: Self-pay | Admitting: Urology

## 2022-05-10 LAB — BASIC METABOLIC PANEL
Anion gap: 5 (ref 5–15)
BUN: 22 mg/dL (ref 8–23)
CO2: 25 mmol/L (ref 22–32)
Calcium: 8.3 mg/dL — ABNORMAL LOW (ref 8.9–10.3)
Chloride: 106 mmol/L (ref 98–111)
Creatinine, Ser: 1.89 mg/dL — ABNORMAL HIGH (ref 0.61–1.24)
GFR, Estimated: 37 mL/min — ABNORMAL LOW (ref >60)
Glucose, Bld: 135 mg/dL — ABNORMAL HIGH (ref 70–99)
Potassium: 4.3 mmol/L (ref 3.5–5.1)
Sodium: 136 mmol/L (ref 135–145)

## 2022-05-10 LAB — HEMOGLOBIN AND HEMATOCRIT, BLOOD
HCT: 43.8 % (ref 39.0–52.0)
Hemoglobin: 14.9 g/dL (ref 13.0–17.0)

## 2022-05-10 NOTE — Progress Notes (Signed)
1 Day Post-Op Subjective: Abdominal pain well controlled.  He does feel some bloating this morning.  Denies nausea or emesis.  Denies flatus or bowel movement.  He is tolerating clears.  Objective: Vital signs in last 24 hours: Temp:  [97.5 F (36.4 C)-98.9 F (37.2 C)] 98.9 F (37.2 C) (09/16 0100) Pulse Rate:  [51-82] 68 (09/16 0530) Resp:  [10-20] 20 (09/16 0530) BP: (112-162)/(66-90) 135/72 (09/16 0530) SpO2:  [97 %-100 %] 98 % (09/16 0530) Weight:  [104.3 kg] 104.3 kg (09/15 1106)  Intake/Output from previous day: 09/15 0701 - 09/16 0700 In: 3495 [P.O.:480; I.V.:2615; IV Piggyback:400] Out: 1555 [RFFMB:8466; Blood:80] Intake/Output this shift: No intake/output data recorded.  UOP: 1.4L clear yellow urine  Physical Exam:  General: Alert and oriented CV: RRR Lungs: Clear Abdomen: Soft, ND, ATTP; inc c/d/I Ext: NT, No erythema  Lab Results: Recent Labs    05/09/22 1632 05/10/22 0410  HGB 15.1 14.9  HCT 46.9 43.8   BMET Recent Labs    05/10/22 0658  NA 136  K 4.3  CL 106  CO2 25  GLUCOSE 135*  BUN 22  CREATININE 1.89*  CALCIUM 8.3*     Studies/Results: No results found.  Assessment/Plan: Right renal mass s/p right robotic assisted laparoscopic radical nephrectomy 05/09/2022  -Pain control as needed -Advance to regular diet as tolerated -Void trial -Reviewed labs, appropriate.  Hemoglobin stable at 14.9.  Creatinine 1.89 from baseline of 1.1-1.3, expected rise -OOB, ambulate, IS -Okay to resume Xarelto -Anticipate discharge home today   LOS: 1 day   Matt R. Jadien Lehigh MD 05/10/2022, 10:16 AM [facility]  Pager: [phone]

## 2022-05-10 NOTE — Progress Notes (Signed)
Patient discharged home.  Discharge instructions explained, patient verbalizes understanding 

## 2022-05-10 NOTE — Discharge Summary (Signed)
Date of admission: 05/09/2022  Date of discharge: 05/10/2022  Admission diagnosis: Right renal mass  Discharge diagnosis: Right renal mass  Secondary diagnoses: History of saddle pulmonary embolism  History and Physical: For full details, please see admission history and physical. Briefly, Stanley Juarez is a 76 y.o. year old patient with a right renal mass who underwent right robotic assisted laparoscopic radical nephrectomy on 05/09/2022.   Hospital Course: The patient recovered in the usual expected fashion.  He had his diet advanced slowly.  Initially managed with IV pain control, then transitioned to PO meds when he was tolerating oral intake.  His labs were stable throughout the hospital course.  He was discharged to home on POD#1.  At the time of discharge the patient was tolerating a regular diet, passing flatus, ambulating, had adequate pain control and was agreeable to discharge.  Follow up as scheduled. He will resume xarelto today.  Laboratory values:  Recent Labs    05/09/22 1632 05/10/22 0410  HGB 15.1 14.9  HCT 46.9 43.8   Recent Labs    05/10/22 0658  CREATININE 1.89*    Disposition: Home  Discharge instruction: The patient was instructed to be ambulatory but told to refrain from heavy lifting, strenuous activity, or driving.   Discharge medications:  Allergies as of 05/10/2022       Reactions   Prednisone    Constipation   Penicillins Swelling, Rash   Did it involve swelling of the face/tongue/throat, SOB, or low BP? Yes Did it involve sudden or severe rash/hives, skin peeling, or any reaction on the inside of your mouth or nose? Yes Did you need to seek medical attention at a hospital or doctor's office? No When did it last happen?    childhood   If all above answers are "NO", may proceed with cephalosporin use.        Medication List     TAKE these medications    amLODipine 5 MG tablet Commonly known as: NORVASC Take 1 tablet (5 mg total) by mouth  daily.   docusate sodium 100 MG capsule Commonly known as: COLACE Take 1 capsule (100 mg total) by mouth 2 (two) times daily.   HYDROcodone-acetaminophen 5-325 MG tablet Commonly known as: Norco Take 1-2 tablets by mouth every 6 (six) hours as needed for moderate pain or severe pain.   lisinopril 10 MG tablet Commonly known as: ZESTRIL Take 10 mg by mouth in the morning.   Lubricant Eye Drops 0.4-0.3 % Soln Generic drug: Polyethyl Glycol-Propyl Glycol Place 1 drop into both eyes in the morning.   Nasal Moist 0.65 % nasal spray Generic drug: sodium chloride Place 1 spray into the nose at bedtime as needed (dryness due to CPAP).   promethazine 12.5 MG tablet Commonly known as: PHENERGAN Take 1 tablet (12.5 mg total) by mouth every 4 (four) hours as needed for nausea or vomiting.   rivaroxaban 20 MG Tabs tablet Commonly known as: XARELTO Take 1 tablet (20 mg total) by mouth daily with supper.   traZODone 100 MG tablet Commonly known as: DESYREL Take 100 mg by mouth at bedtime as needed for sleep.        Followup:   Follow-up Information     Ceasar Mons, MD Follow up on 05/22/2022.   Specialty: Urology Why: at 10:45 Contact information: [address] [place] [address] [place].  [facility]  Pager: [phone]

## 2022-05-10 NOTE — Plan of Care (Signed)
  Problem: Respiratory: Goal: Ability to achieve and maintain a regular respiratory rate will improve Outcome: Progressing   Problem: Clinical Measurements: Goal: Cardiovascular complication will be avoided Outcome: Progressing   Problem: Pain Managment: Goal: General experience of comfort will improve Outcome: Progressing

## 2022-05-14 ENCOUNTER — Other Ambulatory Visit (HOSPITAL_COMMUNITY): Payer: Self-pay

## 2022-05-15 LAB — SURGICAL PATHOLOGY

## 2022-05-19 ENCOUNTER — Other Ambulatory Visit (HOSPITAL_COMMUNITY): Payer: Self-pay

## 2022-05-22 ENCOUNTER — Telehealth: Payer: Self-pay | Admitting: Oncology

## 2022-05-22 DIAGNOSIS — Z85528 Personal history of other malignant neoplasm of kidney: Secondary | ICD-10-CM | POA: Diagnosis not present

## 2022-05-22 DIAGNOSIS — D49512 Neoplasm of unspecified behavior of left kidney: Secondary | ICD-10-CM | POA: Diagnosis not present

## 2022-05-22 NOTE — Telephone Encounter (Signed)
Called patient regarding upcoming October appointments, left a voicemail.  

## 2022-06-02 ENCOUNTER — Telehealth: Payer: Self-pay | Admitting: Oncology

## 2022-06-02 NOTE — Telephone Encounter (Signed)
Called patient regarding upcoming October appointments, patient is notified.  

## 2022-06-06 ENCOUNTER — Inpatient Hospital Stay: Payer: Medicare HMO | Attending: Oncology | Admitting: Oncology

## 2022-06-06 VITALS — BP 140/71 | HR 66 | Temp 97.9°F | Resp 17 | Ht 70.0 in | Wt 238.4 lb

## 2022-06-06 DIAGNOSIS — Z86711 Personal history of pulmonary embolism: Secondary | ICD-10-CM | POA: Insufficient documentation

## 2022-06-06 DIAGNOSIS — Z86718 Personal history of other venous thrombosis and embolism: Secondary | ICD-10-CM | POA: Insufficient documentation

## 2022-06-06 DIAGNOSIS — Z7901 Long term (current) use of anticoagulants: Secondary | ICD-10-CM | POA: Insufficient documentation

## 2022-06-06 DIAGNOSIS — I824Z1 Acute embolism and thrombosis of unspecified deep veins of right distal lower extremity: Secondary | ICD-10-CM | POA: Diagnosis not present

## 2022-06-06 DIAGNOSIS — C641 Malignant neoplasm of right kidney, except renal pelvis: Secondary | ICD-10-CM | POA: Insufficient documentation

## 2022-06-06 DIAGNOSIS — Z79899 Other long term (current) drug therapy: Secondary | ICD-10-CM | POA: Insufficient documentation

## 2022-06-06 DIAGNOSIS — Z905 Acquired absence of kidney: Secondary | ICD-10-CM | POA: Insufficient documentation

## 2022-06-06 NOTE — Progress Notes (Signed)
Hematology and Oncology Follow Up Visit  Stanley Juarez [id number] 04/30/1946 76 y.o. 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  She was found to have massive pulmonary embolism and right DVT in the setting of right kidney neoplasm.   Secondary diagnosis: right kidney neoplasm diagnosed in Stanley Juarez 2023.  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  Prior Therapy: He is status post robotic assisted laparoscopic right radical nephrectomy completed on May 09, 2022.  Current therapy: Xarelto 20 mg daily started in Stanley Juarez 2023.  Interim History: Stanley Juarez returns today for a follow-up visit.  Since the last visit, he completed radical nephrectomy without any complications.  He reports feeling well without any complaints.  He denies any chest pain or shortness of breath.  He denies any cough or wheezing.  He denies flank pain or discomfort.     Medications: I have reviewed the patient's current medications.  Current Outpatient Medications  Medication Sig Dispense Refill   amLODipine (NORVASC) 5 MG tablet Take 1 tablet (5 mg total) by mouth daily. 30 tablet 0   docusate sodium (COLACE) 100 MG capsule Take 1 capsule (100 mg total) by mouth 2 (two) times daily.     HYDROcodone-acetaminophen (NORCO) 5-325 MG tablet Take 1-2 tablets by mouth every 6 (six) hours as needed for moderate pain or severe pain. 20 tablet 0   lisinopril (ZESTRIL) 10 MG tablet Take 10 mg by mouth in the morning.     Polyethyl Glycol-Propyl Glycol (LUBRICANT EYE DROPS) 0.4-0.3 % SOLN Place 1 drop into both eyes in the morning.     promethazine (PHENERGAN) 12.5 MG tablet Take 1 tablet (12.5 mg total) by mouth every 4 (four) hours as needed for nausea or vomiting. 10 tablet 0   rivaroxaban (XARELTO) 20 MG TABS tablet Take 1 tablet (20 mg total) by mouth  daily with supper. 30 tablet 0   sodium chloride (NASAL MOIST) 0.65 % nasal spray Place 1 spray into the nose at bedtime as needed (dryness due to CPAP).     traZODone (DESYREL) 100 MG tablet Take 100 mg by mouth at bedtime as needed for sleep.     No current facility-administered medications for this visit.     Allergies:  Allergies  Allergen Reactions   Prednisone     Constipation   Penicillins Swelling and Rash    Did it involve swelling of the face/tongue/throat, SOB, or low BP? Yes Did it involve sudden or severe rash/hives, skin peeling, or any reaction on the inside of your mouth or nose? Yes Did you need to seek medical attention at a hospital or doctor's office? No When did it last happen?    childhood   If all above answers are "NO", Stanley Juarez proceed with cephalosporin use.       Physical Exam: Blood pressure (!) 140/71, pulse 66, temperature 97.9 F (36.6 C), temperature source Temporal, resp. rate 17, height '5\' 10"'$  (1.778 m), weight 238 lb 6.4 oz (108.1 kg), SpO2 99 %.  ECOG: 1 General appearance: alert and cooperative appeared without distress. Head: Normocephalic, without obvious abnormality Oropharynx: No oral thrush or ulcers. Eyes: No scleral icterus.  Pupils are equal and round reactive to light. Lymph nodes: Cervical, supraclavicular, and axillary nodes normal. Heart:regular rate and rhythm, S1, S2 normal, no murmur, click, rub or gallop Lung:chest clear, no wheezing, rales, normal  symmetric air entry Abdomin: soft, non-tender, without masses or organomegaly. Neurological: No motor, sensory deficits.  Intact deep tendon reflexes. Skin: No rashes or lesions.  No ecchymosis or petechiae. Musculoskeletal: No joint deformity or effusion. Psychiatric: Mood and affect are appropriate.    Lab Results: Lab Results  Component Value Date   WBC 5.5 04/29/2022   HGB 14.9 05/10/2022   HCT 43.8 05/10/2022   MCV 92.7 04/29/2022   PLT 197 04/29/2022     Chemistry       Component Value Date/Time   NA 136 05/10/2022 0658   K 4.3 05/10/2022 0658   CL 106 05/10/2022 0658   CO2 25 05/10/2022 0658   BUN 22 05/10/2022 0658   CREATININE 1.89 (H) 05/10/2022 0658      Component Value Date/Time   CALCIUM 8.3 (L) 05/10/2022 0658   ALKPHOS 60 12/26/2021 1036   AST 17 12/26/2021 1036   ALT 19 12/26/2021 1036   BILITOT 0.7 12/26/2021 1036         Impression and Plan:   76 year old with:  1.  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  He required thrombolysis by interventional radiology.  He is currently on Xarelto without any bleeding or respiratory complications.  Risks and benefits of continuing anticoagulation were discussed at this time.  It is unclear whether this blood clot was provoked by his kidney neoplasm versus unprovoked.  If this is caused by the tumor that has been removed, completing 6 months of anticoagulation is sufficient.  If this is unprovoked lifetime anticoagulation is probably warranted.  After discussion today, I have recommended lifetime anticoagulation given the serious nature of his pulmonary embolism that appears to be unprovoked.  I do not feel that oncocytic neoplasm of the kidney contributed at this time rather than an incidental finding.  Long-term anticoagulation carries a risk of increased bleeding and he understands this risk.  He is willing to continue at this time.  If he develops bleeding in the future this will be revisited.  2.  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  He continues to follow with urology regarding left kidney tumor as well.  3.  Follow-up: I am happy to see him in the future as needed.  30  minutes were dedicated to this visit. The time was spent on reviewing pathology results, imaging studies, discussing treatment options, and answering questions regarding future plan.   Zola Button,  MD 10/13/20233:00 PM

## 2022-06-18 DIAGNOSIS — D3000 Benign neoplasm of unspecified kidney: Secondary | ICD-10-CM | POA: Diagnosis not present

## 2022-07-08 ENCOUNTER — Ambulatory Visit: Payer: Medicare HMO | Admitting: Pulmonary Disease

## 2022-07-08 ENCOUNTER — Encounter: Payer: Self-pay | Admitting: Pulmonary Disease

## 2022-07-08 VITALS — BP 140/72 | HR 50 | Temp 98.2°F | Wt 236.6 lb

## 2022-07-08 DIAGNOSIS — I2602 Saddle embolus of pulmonary artery with acute cor pulmonale: Secondary | ICD-10-CM | POA: Diagnosis not present

## 2022-07-08 NOTE — Progress Notes (Signed)
$'@Patient'V$  ID: Stanley Juarez, male    DOB: May 24, 1946, 76 y.o.   MRN: [id number]  Chief Complaint  Patient presents with   Follow-up    Follow up for acute pulmonary embolism. Pt is currently on xarelto. Pt states no issues noted on medication. No brusing or bleeding noted from patient so far.     Referring provider: Mayra Neer, MD  HPI:   76 y.o. man whom we are seeing in follow up for evaluation of provoked submassive pulmonary embolus.    Doing well. Reports good adherence to Xarelto.  Again, reviewed TTE 03/2022 shows normal RV size, function, estimated RVSP/PASP.  Tolerating Xarelto well.  Bleeds a bit when he scratches himself.  No issues with hematemesis, melena, hematochezia.  Discussed lifelong anticoagulation which he is eager to pursue.  Several questions answered to the best my ability.  Underwent radical nephrectomy.  This went well.  Discharge next day.  No issues with bleeding.  Pathology revealed oncocytoma.  He continues routine urology follow-up.  Most recent oncology note x2 reviewed.  Recommend lifelong anticoagulation.  HPI at initial visit: Patient was splitting wood.  Toss long toward splitter about 15 feet away.  Had sudden onset of chest pressure/pain.  Substernal.  This seemed to resolve after a few moments.  He thought it was just a muscle strain.  Later that evening he developed significant dyspnea, really shortness of breath at rest.  Cannot sleep due to how short of breath he was.  This prompted presentation to the ED.  There CTA PE protocol was performed on my review interpretation shows significant clot burden, otherwise clear lungs.  RV to LV ratio was mildly increased.  BNP and troponin both elevated.  Consistent with submassive pulmonary embolus.  Unfortunate, no echocardiogram was done.  He was evaluated by IR and underwent catheter directed lytic therapy.  He was on heparin in the hospital and transition to [place].  He completed loading dose.  Continues  on Xarelto 20 mg daily.  He denies any issues with bleeding.  No hematochezia or melena.  No hematuria.  No hematemesis or hemoptysis.  Reviewed lower extremity Dopplers 12/2021 that demonstrated significant right-sided DVT.  The Dopplers were repeated 01/2022 by hematology and showed resolution of most clot with residual popliteal clot.  The CTA PE protocol did incidentally find renal mass on the right with nodularity on the left.  This prompted urology referral.  Ongoing work-up.  High suspicion for cancer which has been shared with him.  Likely predisposing factor to blood clot which was discussed today.  Plan for surgery after 3 months of anticoagulation.  Sounds like you need a right total nephrectomy.  PMH: Hypertension Surgical history: Skin cancer removal Family history:History reviewed. No pertinent family history. Social history: Never smoker, [place]  Questionaires / Pulmonary Flowsheets:   ACT:      No data to display          MMRC:     No data to display          Epworth:      No data to display          Tests:   FENO:  No results found for: "NITRICOXIDE"  PFT:     No data to display          WALK:     12/30/2021    3:17 PM  SIX MIN WALK  Supplimental Oxygen during Test? (L/min) No  Tech Comments: pt walked  at an average pace completing all required laps denying any complaints of SOB.    Imaging: Personally reviewed and as per EMR discussion this note No results found.  Lab Results: Personally reviewed CBC    Component Value Date/Time   WBC 5.5 04/29/2022 0755   RBC 5.33 04/29/2022 0755   HGB 14.9 05/10/2022 0410   HCT 43.8 05/10/2022 0410   PLT 197 04/29/2022 0755   MCV 92.7 04/29/2022 0755   MCH 30.2 04/29/2022 0755   MCHC 32.6 04/29/2022 0755   RDW 13.6 04/29/2022 0755    BMET    Component Value Date/Time   NA 136 05/10/2022 0658   K 4.3 05/10/2022 0658   CL 106 05/10/2022 0658   CO2 25 05/10/2022 0658   GLUCOSE  135 (H) 05/10/2022 0658   BUN 22 05/10/2022 0658   CREATININE 1.89 (H) 05/10/2022 0658   CALCIUM 8.3 (L) 05/10/2022 0658   GFRNONAA 37 (L) 05/10/2022 0658    BNP    Component Value Date/Time   BNP 72.3 12/27/2021 0524    ProBNP No results found for: "PROBNP"  Specialty Problems   None   Allergies  Allergen Reactions   Prednisone     Constipation   Penicillins Swelling and Rash    Did it involve swelling of the face/tongue/throat, SOB, or low BP? Yes Did it involve sudden or severe rash/hives, skin peeling, or any reaction on the inside of your mouth or nose? Yes Did you need to seek medical attention at a hospital or doctor's office? No When did it last happen?    childhood   If all above answers are "NO", may proceed with cephalosporin use.      Immunization History  Administered Date(s) Administered   Moderna Sars-Covid-2 Vaccination 10/07/2019, 11/02/2019   Pneumococcal Conjugate-13 08/03/2013   Pneumococcal Polysaccharide-23 08/28/2014   Td 08/25/1997   Tdap 07/26/2012   Zoster, Live 07/26/2012    Past Medical History:  Diagnosis Date   Arthritis    back   Cancer ([place])    Hypertension    Pneumonia    Sleep apnea     Tobacco History: Social History   Tobacco Use  Smoking Status Never  Smokeless Tobacco Never   Counseling given: Not Answered   Continue to not smoke  Outpatient Encounter Medications as of 07/08/2022  Medication Sig   amLODipine (NORVASC) 5 MG tablet Take 1 tablet (5 mg total) by mouth daily.   docusate sodium (COLACE) 100 MG capsule Take 1 capsule (100 mg total) by mouth 2 (two) times daily.   HYDROcodone-acetaminophen (NORCO) 5-325 MG tablet Take 1-2 tablets by mouth every 6 (six) hours as needed for moderate pain or severe pain.   lisinopril (ZESTRIL) 10 MG tablet Take 10 mg by mouth in the morning.   Polyethyl Glycol-Propyl Glycol (LUBRICANT EYE DROPS) 0.4-0.3 % SOLN Place 1 drop into both eyes in the morning.    promethazine (PHENERGAN) 12.5 MG tablet Take 1 tablet (12.5 mg total) by mouth every 4 (four) hours as needed for nausea or vomiting.   rivaroxaban (XARELTO) 20 MG TABS tablet Take 1 tablet (20 mg total) by mouth daily with supper.   sodium chloride (NASAL MOIST) 0.65 % nasal spray Place 1 spray into the nose at bedtime as needed (dryness due to CPAP).   traZODone (DESYREL) 100 MG tablet Take 100 mg by mouth at bedtime as needed for sleep.   No facility-administered encounter medications on file as of 07/08/2022.  Review of Systems  Review of Systems  N/a Physical Exam  BP (!) 140/72 (BP Location: Left Arm, Patient Position: Sitting, Cuff Size: Normal)   Pulse (!) 50   Temp 98.2 F (36.8 C) (Oral)   Wt 236 lb 9.6 oz (107.3 kg)   SpO2 96%   BMI 33.95 kg/m   Wt Readings from Last 5 Encounters:  07/08/22 236 lb 9.6 oz (107.3 kg)  06/06/22 238 lb 6.4 oz (108.1 kg)  05/09/22 230 lb (104.3 kg)  04/17/22 236 lb 6.4 oz (107.2 kg)  02/28/22 235 lb (106.6 kg)    BMI Readings from Last 5 Encounters:  07/08/22 33.95 kg/m  06/06/22 34.21 kg/m  05/09/22 33.00 kg/m  04/29/22 33.92 kg/m  04/17/22 33.92 kg/m     Physical Exam General: Well-appearing, no acute distress Eyes: EOMI, icterus Neck: Supple, no JVP Pulmonary: Clear, normal work of breathing Cardiovascular: Regular rhythm, warm Abdomen: Nondistended, bowel sounds present MSK: No synovitis, joint effusion Neuro: Normal gait, no weakness Psych: Normal mood, full affect   Assessment & Plan:   Submassive pulmonary embolism:  Troponin and BNP elevated.  Slightly increased RV to LV ratio on CT scan.  Unfortunately no echocardiogram was performed.  Underwent catheter directed lytics.  Symptoms improved.  Continue on Xarelto 20 mg daily.  Recommend indefinite anticoagulation.  TTE 03/2022, 3 months after event with normal RV size, function, estimated RVSP.  This is encouraging.  I agree with indefinite anticoagulation.   Some thought of possible provoked in the setting of kidney tumor this is a oncocytoma, unclear how thrombogenic this is.     Return in about 14 months (around 08/28/2023).   Lanier Clam, MD 07/08/2022

## 2022-07-08 NOTE — Patient Instructions (Addendum)
Nice to see you again  Continue Xarelto indefinitely  Return to clinic January 2025

## 2022-08-26 ENCOUNTER — Emergency Department (HOSPITAL_COMMUNITY): Payer: Medicare HMO

## 2022-08-26 ENCOUNTER — Inpatient Hospital Stay (HOSPITAL_COMMUNITY)
Admission: EM | Admit: 2022-08-26 | Discharge: 2022-08-30 | DRG: 871 | Disposition: A | Discharge status: Home / Self Care | Payer: Medicare HMO | Attending: Internal Medicine | Admitting: Internal Medicine

## 2022-08-26 ENCOUNTER — Encounter (HOSPITAL_COMMUNITY): Payer: Self-pay | Admitting: Emergency Medicine

## 2022-08-26 ENCOUNTER — Other Ambulatory Visit: Payer: Self-pay

## 2022-08-26 DIAGNOSIS — Z86711 Personal history of pulmonary embolism: Secondary | ICD-10-CM | POA: Diagnosis present

## 2022-08-26 DIAGNOSIS — A4189 Other specified sepsis: Principal | ICD-10-CM | POA: Diagnosis present

## 2022-08-26 DIAGNOSIS — R000 Tachycardia, unspecified: Secondary | ICD-10-CM

## 2022-08-26 DIAGNOSIS — J1000 Influenza due to other identified influenza virus with unspecified type of pneumonia: Secondary | ICD-10-CM | POA: Diagnosis not present

## 2022-08-26 DIAGNOSIS — I4892 Unspecified atrial flutter: Secondary | ICD-10-CM | POA: Diagnosis not present

## 2022-08-26 DIAGNOSIS — Z6832 Body mass index (BMI) 32.0-32.9, adult: Secondary | ICD-10-CM

## 2022-08-26 DIAGNOSIS — Z86718 Personal history of other venous thrombosis and embolism: Secondary | ICD-10-CM | POA: Diagnosis not present

## 2022-08-26 DIAGNOSIS — I2510 Atherosclerotic heart disease of native coronary artery without angina pectoris: Secondary | ICD-10-CM | POA: Diagnosis present

## 2022-08-26 DIAGNOSIS — J189 Pneumonia, unspecified organism: Principal | ICD-10-CM

## 2022-08-26 DIAGNOSIS — J101 Influenza due to other identified influenza virus with other respiratory manifestations: Secondary | ICD-10-CM

## 2022-08-26 DIAGNOSIS — Z85828 Personal history of other malignant neoplasm of skin: Secondary | ICD-10-CM | POA: Diagnosis not present

## 2022-08-26 DIAGNOSIS — E669 Obesity, unspecified: Secondary | ICD-10-CM | POA: Diagnosis present

## 2022-08-26 DIAGNOSIS — I4819 Other persistent atrial fibrillation: Secondary | ICD-10-CM | POA: Diagnosis present

## 2022-08-26 DIAGNOSIS — A419 Sepsis, unspecified organism: Secondary | ICD-10-CM | POA: Diagnosis not present

## 2022-08-26 DIAGNOSIS — Z79899 Other long term (current) drug therapy: Secondary | ICD-10-CM | POA: Diagnosis not present

## 2022-08-26 DIAGNOSIS — Z905 Acquired absence of kidney: Secondary | ICD-10-CM

## 2022-08-26 DIAGNOSIS — Z7901 Long term (current) use of anticoagulants: Secondary | ICD-10-CM

## 2022-08-26 DIAGNOSIS — N179 Acute kidney failure, unspecified: Secondary | ICD-10-CM | POA: Diagnosis not present

## 2022-08-26 DIAGNOSIS — R42 Dizziness and giddiness: Secondary | ICD-10-CM | POA: Diagnosis not present

## 2022-08-26 DIAGNOSIS — G4733 Obstructive sleep apnea (adult) (pediatric): Secondary | ICD-10-CM | POA: Diagnosis present

## 2022-08-26 DIAGNOSIS — Z1152 Encounter for screening for COVID-19: Secondary | ICD-10-CM | POA: Diagnosis not present

## 2022-08-26 DIAGNOSIS — R0602 Shortness of breath: Secondary | ICD-10-CM | POA: Diagnosis not present

## 2022-08-26 DIAGNOSIS — G4730 Sleep apnea, unspecified: Secondary | ICD-10-CM | POA: Diagnosis present

## 2022-08-26 DIAGNOSIS — I10 Essential (primary) hypertension: Secondary | ICD-10-CM | POA: Diagnosis not present

## 2022-08-26 DIAGNOSIS — J09X1 Influenza due to identified novel influenza A virus with pneumonia: Secondary | ICD-10-CM | POA: Diagnosis present

## 2022-08-26 DIAGNOSIS — R0600 Dyspnea, unspecified: Secondary | ICD-10-CM | POA: Diagnosis not present

## 2022-08-26 LAB — URINALYSIS, COMPLETE (UACMP) WITH MICROSCOPIC
Bacteria, UA: NONE SEEN
Bilirubin Urine: NEGATIVE
Glucose, UA: NEGATIVE mg/dL
Ketones, ur: NEGATIVE mg/dL
Leukocytes,Ua: NEGATIVE
Nitrite: NEGATIVE
Protein, ur: 100 mg/dL — AB
Specific Gravity, Urine: 1.019 (ref 1.005–1.030)
pH: 5 (ref 5.0–8.0)

## 2022-08-26 LAB — CBC
HCT: 44 % (ref 39.0–52.0)
Hemoglobin: 14.4 g/dL (ref 13.0–17.0)
MCH: 30.1 pg (ref 26.0–34.0)
MCHC: 32.7 g/dL (ref 30.0–36.0)
MCV: 91.9 fL (ref 80.0–100.0)
Platelets: 257 10*3/uL (ref 150–400)
RBC: 4.79 MIL/uL (ref 4.22–5.81)
RDW: 13.7 % (ref 11.5–15.5)
WBC: 12 10*3/uL — ABNORMAL HIGH (ref 4.0–10.5)
nRBC: 0 % (ref 0.0–0.2)

## 2022-08-26 LAB — TROPONIN I (HIGH SENSITIVITY): Troponin I (High Sensitivity): 15 ng/L (ref <18)

## 2022-08-26 LAB — BASIC METABOLIC PANEL
Anion gap: 11 (ref 5–15)
BUN: 25 mg/dL — ABNORMAL HIGH (ref 8–23)
CO2: 21 mmol/L — ABNORMAL LOW (ref 22–32)
Calcium: 8.5 mg/dL — ABNORMAL LOW (ref 8.9–10.3)
Chloride: 102 mmol/L (ref 98–111)
Creatinine, Ser: 1.83 mg/dL — ABNORMAL HIGH (ref 0.61–1.24)
GFR, Estimated: 38 mL/min — ABNORMAL LOW (ref >60)
Glucose, Bld: 159 mg/dL — ABNORMAL HIGH (ref 70–99)
Potassium: 5 mmol/L (ref 3.5–5.1)
Sodium: 134 mmol/L — ABNORMAL LOW (ref 135–145)

## 2022-08-26 LAB — RESP PANEL BY RT-PCR (RSV, FLU A&B, COVID)  RVPGX2
Influenza A by PCR: POSITIVE — AB
Influenza B by PCR: NEGATIVE
Resp Syncytial Virus by PCR: NEGATIVE
SARS Coronavirus 2 by RT PCR: NEGATIVE

## 2022-08-26 LAB — PROCALCITONIN: Procalcitonin: 0.27 ng/mL

## 2022-08-26 LAB — STREP PNEUMONIAE URINARY ANTIGEN: Strep Pneumo Urinary Antigen: NEGATIVE

## 2022-08-26 LAB — LACTIC ACID, PLASMA: Lactic Acid, Venous: 0.8 mmol/L (ref 0.5–1.9)

## 2022-08-26 MED ORDER — ACETAMINOPHEN 650 MG RE SUPP: 650.0000 mg | Freq: Four times a day (QID) | RECTAL | Status: DC | PRN

## 2022-08-26 MED ORDER — RIVAROXABAN 20 MG PO TABS
20.0000 mg | ORAL_TABLET | Freq: Every day | ORAL | Status: DC
  Administered 2022-08-27 – 2022-08-29 (×3): 20 mg via ORAL
  Filled 2022-08-26 (×3): qty 1

## 2022-08-26 MED ORDER — LEVOFLOXACIN IN D5W 750 MG/150ML IV SOLN
750.0000 mg | INTRAVENOUS | Status: DC
  Administered 2022-08-26 – 2022-08-28 (×2): 750 mg via INTRAVENOUS
  Filled 2022-08-26 (×3): qty 150

## 2022-08-26 MED ORDER — SODIUM CHLORIDE 0.9 % IV BOLUS (SEPSIS)
500.0000 mL | Freq: Once | INTRAVENOUS | Status: AC
  Administered 2022-08-26: 500 mL via INTRAVENOUS

## 2022-08-26 MED ORDER — GUAIFENESIN-DM 100-10 MG/5ML PO SYRP
5.0000 mL | ORAL_SOLUTION | ORAL | Status: DC | PRN
  Administered 2022-08-29 – 2022-08-30 (×2): 5 mL via ORAL
  Filled 2022-08-26 (×2): qty 10

## 2022-08-26 MED ORDER — SODIUM CHLORIDE 0.9 % IV BOLUS
500.0000 mL | Freq: Once | INTRAVENOUS | Status: AC
  Administered 2022-08-26: 500 mL via INTRAVENOUS

## 2022-08-26 MED ORDER — LABETALOL HCL 5 MG/ML IV SOLN: 10.0000 mg | INTRAVENOUS | Status: DC | PRN

## 2022-08-26 MED ORDER — OXYCODONE HCL 5 MG PO TABS: 5.0000 mg | ORAL_TABLET | ORAL | Status: DC | PRN

## 2022-08-26 MED ORDER — ONDANSETRON HCL 4 MG PO TABS: 4.0000 mg | ORAL_TABLET | Freq: Four times a day (QID) | ORAL | Status: DC | PRN

## 2022-08-26 MED ORDER — OSELTAMIVIR PHOSPHATE 30 MG PO CAPS
30.0000 mg | ORAL_CAPSULE | Freq: Two times a day (BID) | ORAL | Status: DC
  Administered 2022-08-27 – 2022-08-30 (×7): 30 mg via ORAL
  Filled 2022-08-26 (×7): qty 1

## 2022-08-26 MED ORDER — DILTIAZEM HCL 25 MG/5ML IV SOLN: 20.0000 mg | INTRAVENOUS | Status: DC | PRN

## 2022-08-26 MED ORDER — ACETAMINOPHEN 325 MG PO TABS
650.0000 mg | ORAL_TABLET | Freq: Four times a day (QID) | ORAL | Status: DC | PRN
  Administered 2022-08-26: 650 mg via ORAL
  Filled 2022-08-26: qty 2

## 2022-08-26 MED ORDER — SODIUM CHLORIDE 0.9 % IV SOLN: INTRAVENOUS | Status: AC

## 2022-08-26 MED ORDER — ONDANSETRON HCL 4 MG/2ML IJ SOLN: 4.0000 mg | Freq: Four times a day (QID) | INTRAMUSCULAR | Status: DC | PRN

## 2022-08-26 MED ORDER — SODIUM CHLORIDE 0.9% FLUSH
3.0000 mL | Freq: Two times a day (BID) | INTRAVENOUS | Status: DC
  Administered 2022-08-26 – 2022-08-30 (×4): 3 mL via INTRAVENOUS

## 2022-08-26 MED ORDER — SENNOSIDES-DOCUSATE SODIUM 8.6-50 MG PO TABS: 1.0000 | ORAL_TABLET | Freq: Every evening | ORAL | Status: DC | PRN

## 2022-08-26 MED ORDER — SODIUM CHLORIDE 0.9 % IV SOLN
1000.0000 mL | INTRAVENOUS | Status: DC
  Administered 2022-08-26: 1000 mL via INTRAVENOUS

## 2022-08-26 MED ORDER — OSELTAMIVIR PHOSPHATE 75 MG PO CAPS
75.0000 mg | ORAL_CAPSULE | Freq: Once | ORAL | Status: AC
  Administered 2022-08-26: 75 mg via ORAL
  Filled 2022-08-26: qty 1

## 2022-08-26 MED ORDER — ACETAMINOPHEN 325 MG PO TABS: 650.0000 mg | ORAL_TABLET | Freq: Four times a day (QID) | ORAL | Status: DC | PRN

## 2022-08-26 NOTE — Progress Notes (Signed)
Pharmacy Antibiotic Note  ANDRUE Juarez is a 77 y.o. male admitted on 08/26/2022 with pneumonia.  Pharmacy has been consulted for levofloxacin dosing.  Today, 08/26/22 -WBC elevated, Febrile -Influenza+ -SCr 1.83, CrCl = 43 mL/min  Plan: Levofloxacin 750 mg IV q48h for CrCl 20-50 mL/min Monitor renal function  Height: '5\' 10"'$  (177.8 cm) Weight: 103.9 kg (229 lb) IBW/kg (Calculated) : 73  Temp (24hrs), Avg:99.2 F (37.3 C), Min:98.2 F (36.8 C), Max:100.7 F (38.2 C)  Recent Labs  Lab 08/26/22 1750  WBC 12.0*  CREATININE 1.83*    Estimated Creatinine Clearance: 42.8 mL/min (A) (by C-G formula based on SCr of 1.83 mg/dL (H)).    Allergies  Allergen Reactions   Prednisone     Constipation   Penicillins Swelling and Rash    Did it involve swelling of the face/tongue/throat, SOB, or low BP? Yes Did it involve sudden or severe rash/hives, skin peeling, or any reaction on the inside of your mouth or nose? Yes Did you need to seek medical attention at a hospital or doctor's office? No When did it last happen?    childhood   If all above answers are "NO", may proceed with cephalosporin use.      Antimicrobials this admission: levofloxacin 1/2 >>   Lenis Noon, PharmD 08/26/2022 8:16 PM

## 2022-08-26 NOTE — ED Triage Notes (Signed)
Pt referred here from [facility]. EKG at [facility] showed A flutter/tachy w/ RVR at 136 and pneumonia. Pt co SOB for 3x days.   Aox4

## 2022-08-26 NOTE — H&P (Signed)
History and Physical    Stanley Juarez EGB:[id number] DOB: November 07, 1945 DOA: 08/26/2022  PCP: Mayra Neer, MD   Patient coming from: Home   Chief Complaint: Cough, congestion, SOB   HPI: Stanley Juarez is a pleasant 77 y.o. male with medical history significant for CAD hypertension, OSA, history of DVT and PE on Xarelto, and right renal oncocytoma status post nephrectomy who presents to the emergency department with cough, congestion, and shortness of breath.  Patient reports approximately 3 days of cough, congestion, loss of appetite, and fatigue.  He then went on to develop shortness of breath as well.  His cough has been productive of thick dark sputum.  He denies any associated chest pain, palpitations, or leg swelling.  He was initially evaluated at an urgent care for this but appeared to be in rapid atrial flutter and was sent to the ED.  He denies any history of atrial fibrillation or atrial flutter.  He reports adherence with Xarelto.  ED Course: Upon arrival to the ED, patient is found to be febrile to 38.2 C and saturating well on room air with tachypnea, tachycardia, and stable blood pressure.  Chest x-ray concerning for pneumonia.  Influenza A PCR is positive.  Blood work notable for leukocytosis to 12,000 and creatinine 1.83.  He was given a 500 cc normal saline bolus in the ED.  Review of Systems:  All other systems reviewed and apart from HPI, are negative.  Past Medical History:  Diagnosis Date   Arthritis    back   Cancer ([place])    Hypertension    Pneumonia    Sleep apnea     Past Surgical History:  Procedure Laterality Date   CATARACT EXTRACTION     IR ANGIOGRAM PULMONARY BILATERAL SELECTIVE  12/25/2021   IR INFUSION THROMBOL ARTERIAL INITIAL (MS)  12/25/2021   IR INFUSION THROMBOL ARTERIAL INITIAL (MS)  12/25/2021   IR RADIOLOGIST EVAL & MGMT  01/30/2022   IR THROMB F/U EVAL ART/VEN FINAL DAY (MS)  12/25/2021   IR US GUIDE VASC ACCESS RIGHT  12/25/2021   IR US  GUIDE VASC ACCESS RIGHT  12/25/2021   MENISCUS REPAIR Right    ROBOT ASSISTED LAPAROSCOPIC NEPHRECTOMY Right 05/09/2022   Procedure: XI ROBOTIC ASSISTED LAPAROSCOPIC NEPHRECTOMY;  Surgeon: Ceasar Mons, MD;  Location: [facility];  Service: Urology;  Laterality: Right;  ONLY NEEDS 180 MIN   ROTATOR CUFF REPAIR Right    skin cancer removal      Social History:   reports that he has never smoked. He has never used smokeless tobacco. He reports that he does not drink alcohol and does not use drugs.  Allergies  Allergen Reactions   Prednisone     Constipation   Penicillins Swelling and Rash    Did it involve swelling of the face/tongue/throat, SOB, or low BP? Yes Did it involve sudden or severe rash/hives, skin peeling, or any reaction on the inside of your mouth or nose? Yes Did you need to seek medical attention at a hospital or doctor's office? No When did it last happen?    childhood   If all above answers are "NO", may proceed with cephalosporin use.      History reviewed. No pertinent family history.   Prior to Admission medications   Medication Sig Start Date End Date Taking? Authorizing Provider  amLODipine (NORVASC) 5 MG tablet Take 1 tablet (5 mg total) by mouth daily. 12/28/21   Charlynne Cousins, MD  docusate  sodium (COLACE) 100 MG capsule Take 1 capsule (100 mg total) by mouth 2 (two) times daily. 05/09/22   Debbrah Alar, PA-C  HYDROcodone-acetaminophen (NORCO) 5-325 MG tablet Take 1-2 tablets by mouth every 6 (six) hours as needed for moderate pain or severe pain. 05/09/22   Debbrah Alar, PA-C  lisinopril (ZESTRIL) 10 MG tablet Take 10 mg by mouth in the morning. 12/04/21   [provider]  Polyethyl Glycol-Propyl Glycol (LUBRICANT EYE DROPS) 0.4-0.3 % SOLN Place 1 drop into both eyes in the morning.    [provider]  promethazine (PHENERGAN) 12.5 MG tablet Take 1 tablet (12.5 mg total) by mouth every 4 (four) hours as needed for nausea or  vomiting. 05/09/22   Debbrah Alar, PA-C  rivaroxaban (XARELTO) 20 MG TABS tablet Take 1 tablet (20 mg total) by mouth daily with supper. 01/18/22   Charlynne Cousins, MD  sodium chloride (NASAL MOIST) 0.65 % nasal spray Place 1 spray into the nose at bedtime as needed (dryness due to CPAP).    [provider]  traZODone (DESYREL) 100 MG tablet Take 100 mg by mouth at bedtime as needed for sleep. 12/03/21   [provider]    Physical Exam: Vitals:   08/26/22 1746 08/26/22 1830 08/26/22 1845 08/26/22 1911  BP: (!) 154/92 (!) 144/81 (!) 151/96 (!) 153/89  Pulse: (!) 135 (!) 122 (!) 131 (!) 120  Resp: (!) 30 (!) 24 (!) 28 (!) 24  Temp: 98.6 F (37 C)   (!) 100.7 F (38.2 C)  TempSrc:    Oral  SpO2: 97% 96% 98% 98%  Weight:      Height:        Constitutional: NAD, calm  Eyes: PERTLA, lids and conjunctivae normal ENMT: Mucous membranes are moist. Posterior pharynx clear of any exudate or lesions.   Neck: supple, no masses  Respiratory: Speaking full sentences, no wheezing. No accessory muscle use.  Cardiovascular: S1 & S2 heard, regular rate and rhythm. Trace lower extremity edema. No JVD.  Abdomen: No distension, no tenderness, soft. Bowel sounds active.  Musculoskeletal: no clubbing / cyanosis. No joint deformity upper and lower extremities.   Skin: no significant rashes, lesions, ulcers. Warm, dry, well-perfused. Neurologic: CN 2-12 grossly intact. Moving all extremities. Alert and oriented.  Psychiatric: Pleasant. Cooperative.    Labs and Imaging on Admission: I have personally reviewed following labs and imaging studies  CBC: Recent Labs  Lab 08/26/22 1750  WBC 12.0*  HGB 14.4  HCT 44.0  MCV 91.9  PLT 382   Basic Metabolic Panel: Recent Labs  Lab 08/26/22 1750  NA 134*  K 5.0  CL 102  CO2 21*  GLUCOSE 159*  BUN 25*  CREATININE 1.83*  CALCIUM 8.5*   GFR: Estimated Creatinine Clearance: 42.8 mL/min (A) (by C-G formula based on SCr of 1.83  mg/dL (H)). Liver Function Tests: No results for input(s): "AST", "ALT", "ALKPHOS", "BILITOT", "PROT", "ALBUMIN" in the last 168 hours. No results for input(s): "LIPASE", "AMYLASE" in the last 168 hours. No results for input(s): "AMMONIA" in the last 168 hours. Coagulation Profile: No results for input(s): "INR", "PROTIME" in the last 168 hours. Cardiac Enzymes: No results for input(s): "CKTOTAL", "CKMB", "CKMBINDEX", "TROPONINI" in the last 168 hours. BNP (last 3 results) No results for input(s): "PROBNP" in the last 8760 hours. HbA1C: No results for input(s): "HGBA1C" in the last 72 hours. CBG: No results for input(s): "GLUCAP" in the last 168 hours. Lipid Profile: No results for input(s): "  CHOL", "HDL", "LDLCALC", "TRIG", "CHOLHDL", "LDLDIRECT" in the last 72 hours. Thyroid Function Tests: No results for input(s): "TSH", "T4TOTAL", "FREET4", "T3FREE", "THYROIDAB" in the last 72 hours. Anemia Panel: No results for input(s): "VITAMINB12", "FOLATE", "FERRITIN", "TIBC", "IRON", "RETICCTPCT" in the last 72 hours. Urine analysis: No results found for: "COLORURINE", "APPEARANCEUR", "LABSPEC", "PHURINE", "GLUCOSEU", "HGBUR", "BILIRUBINUR", "KETONESUR", "PROTEINUR", "UROBILINOGEN", "NITRITE", "LEUKOCYTESUR" Sepsis Labs: '@LABRCNTIP'$ (procalcitonin:4,lacticidven:4) ) Recent Results (from the past 240 hour(s))  Resp panel by RT-PCR (RSV, Flu A&B, Covid) Anterior Nasal Swab     Status: Abnormal   Collection Time: 08/26/22  6:19 PM   Specimen: Anterior Nasal Swab  Result Value Ref Range Status   SARS Coronavirus 2 by RT PCR NEGATIVE NEGATIVE Final    Comment: (NOTE) SARS-CoV-2 target nucleic acids are NOT DETECTED.  The SARS-CoV-2 RNA is generally detectable in upper respiratory specimens during the acute phase of infection. The lowest concentration of SARS-CoV-2 viral copies this assay can detect is 138 copies/mL. A negative result does not preclude SARS-Cov-2 infection and should not be  used as the sole basis for treatment or other patient management decisions. A negative result may occur with  improper specimen collection/handling, submission of specimen other than nasopharyngeal swab, presence of viral mutation(s) within the areas targeted by this assay, and inadequate number of viral copies(<138 copies/mL). A negative result must be combined with clinical observations, patient history, and epidemiological information. The expected result is Negative.  Fact Sheet for Patients:  EntrepreneurPulse.com.au  Fact Sheet for Healthcare Providers:  IncredibleEmployment.be  This test is no t yet approved or cleared by the [place] FDA and  has been authorized for detection and/or diagnosis of SARS-CoV-2 by FDA under an Emergency Use Authorization (EUA). This EUA will remain  in effect (meaning this test can be used) for the duration of the COVID-19 declaration under Section 564(b)(1) of the Act, 21 U.S.C.section 360bbb-3(b)(1), unless the authorization is terminated  or revoked sooner.       Influenza A by PCR POSITIVE (A) NEGATIVE Final   Influenza B by PCR NEGATIVE NEGATIVE Final    Comment: (NOTE) The Xpert Xpress SARS-CoV-2/FLU/RSV plus assay is intended as an aid in the diagnosis of influenza from Nasopharyngeal swab specimens and should not be used as a sole basis for treatment. Nasal washings and aspirates are unacceptable for Xpert Xpress SARS-CoV-2/FLU/RSV testing.  Fact Sheet for Patients: EntrepreneurPulse.com.au  Fact Sheet for Healthcare Providers: IncredibleEmployment.be  This test is not yet approved or cleared by the [place] FDA and has been authorized for detection and/or diagnosis of SARS-CoV-2 by FDA under an Emergency Use Authorization (EUA). This EUA will remain in effect (meaning this test can be used) for the duration of the COVID-19 declaration under Section  564(b)(1) of the Act, 21 U.S.C. section 360bbb-3(b)(1), unless the authorization is terminated or revoked.     Resp Syncytial Virus by PCR NEGATIVE NEGATIVE Final    Comment: (NOTE) Fact Sheet for Patients: EntrepreneurPulse.com.au  Fact Sheet for Healthcare Providers: IncredibleEmployment.be  This test is not yet approved or cleared by the [place] FDA and has been authorized for detection and/or diagnosis of SARS-CoV-2 by FDA under an Emergency Use Authorization (EUA). This EUA will remain in effect (meaning this test can be used) for the duration of the COVID-19 declaration under Section 564(b)(1) of the Act, 21 U.S.C. section 360bbb-3(b)(1), unless the authorization is terminated or revoked.  Performed at [facility], [place] [address] [place], [place] [address]  Radiological Exams on Admission: DG Chest Portable 1 View  Result Date: 08/26/2022 CLINICAL DATA:  Dyspnea EXAM: PORTABLE CHEST 1 VIEW COMPARISON:  12/30/2021 FINDINGS: Patchy airspace disease at the left greater than right lung base. Possible small left effusion. Stable cardiomediastinal silhouette IMPRESSION: Patchy airspace disease at the left greater than right lung base, suspect for pneumonia. Possible small left effusion. Radiographic follow-up to resolution is recommended. Electronically Signed   By: Donavan Foil M.D.   On: 08/26/2022 18:46    EKG: Independently reviewed. Atrial flutter w/ predominate 2:1 AV block, rate 124.   Assessment/Plan   1. Sepsis secondary to pneumonia; influenza A  - Influenza A positive; CXR concerning for pneumonia and he has productive cough and leukocytosis  - Start Tamiflu, culture blood, check/trend procalcitonin, check strep pneumo and legionella antigens, and start antibiotics for now   2. AKI  - SCr is 1.83 on admission; was 1.89 on Sept 16th but 1.18 on Sept 5th and this is suspected to be acute  - Hold  lisinopril, check UA with microscopy, continue IVF hydration, renally-dose medications, repeat chem panel in am    3. Atrial flutter  - Appears to be in rapid atrial flutter in ED which would be a new diagnosis; rate has been in 120-130s with stable BP   - Likely precipitated by the acute respiratory illness  - Treat underlying respiratory illness, continue Xarelto for CVA prevention, use diltiazem for rate-control (EF was preserved on TTE in August 2022)   4. Hypertension  - Hold lisinopril and treat as-needed only for now    5. Hx of DVT and PE  - On lifelong anticoagulation after submassive PE in May 2023   - Continue Xarelto   6. OSA  - CPAP qHS    DVT prophylaxis: Xarelto  Code Status: Full  Level of Care: Level of care: Telemetry Family Communication: Wife at bedside   Disposition Plan:  Patient is from: home  Anticipated d/c is to: Home  Anticipated d/c date is: 08/29/22  Patient currently: Pending improvement in sepsis parameters  Consults called: none Admission status: Inpatient     Vianne Bulls, MD [facility]  08/26/2022, 7:44 PM

## 2022-08-26 NOTE — Progress Notes (Signed)
PT has personal CPAP equip self manages

## 2022-08-26 NOTE — ED Provider Notes (Signed)
[facility] DEPT Provider Note   CSN: [id number] Arrival date & time: 08/26/22  1711     History  Chief Complaint  Patient presents with   Tachycardia   Shortness of Breath    Stanley Juarez is a 77 y.o. male.   Shortness of Breath  Patient has history of sleep apnea hypertension pneumonia pulm embolism, on chronic anticoagulation who presents to the ED for evaluation of cough shortness of breath heart racing.  Patient states he has been having some trouble with cough fevers over the last few days.  He has had temperature up to 101.  He started to feel more short of breath and was concerned he was developing pneumonia.  He went to an urgent care today and they noted his heart rate was fast in the 130s.  Patient was reportedly told he had pneumonia on x-ray.  He was sent to the ED for further evaluation.    Home Medications Prior to Admission medications   Medication Sig Start Date End Date Taking? Authorizing Provider  amLODipine (NORVASC) 5 MG tablet Take 1 tablet (5 mg total) by mouth daily. 12/28/21   Charlynne Cousins, MD  docusate sodium (COLACE) 100 MG capsule Take 1 capsule (100 mg total) by mouth 2 (two) times daily. 05/09/22   Debbrah Alar, PA-C  HYDROcodone-acetaminophen (NORCO) 5-325 MG tablet Take 1-2 tablets by mouth every 6 (six) hours as needed for moderate pain or severe pain. 05/09/22   Debbrah Alar, PA-C  lisinopril (ZESTRIL) 10 MG tablet Take 10 mg by mouth in the morning. 12/04/21   [provider]  Polyethyl Glycol-Propyl Glycol (LUBRICANT EYE DROPS) 0.4-0.3 % SOLN Place 1 drop into both eyes in the morning.    [provider]  promethazine (PHENERGAN) 12.5 MG tablet Take 1 tablet (12.5 mg total) by mouth every 4 (four) hours as needed for nausea or vomiting. 05/09/22   Debbrah Alar, PA-C  rivaroxaban (XARELTO) 20 MG TABS tablet Take 1 tablet (20 mg total) by mouth daily with supper. 01/18/22   Charlynne Cousins,  MD  sodium chloride (NASAL MOIST) 0.65 % nasal spray Place 1 spray into the nose at bedtime as needed (dryness due to CPAP).    [provider]  traZODone (DESYREL) 100 MG tablet Take 100 mg by mouth at bedtime as needed for sleep. 12/03/21   [provider]      Allergies    Prednisone and Penicillins    Review of Systems   Review of Systems  Respiratory:  Positive for shortness of breath.     Physical Exam Updated Vital Signs BP (!) 153/89 (BP Location: Left Arm)   Pulse (!) 120   Temp (!) 100.7 F (38.2 C) (Oral)   Resp (!) 24   Ht 1.778 m ('5\' 10"'$ )   Wt 103.9 kg   SpO2 98%   BMI 32.86 kg/m  Physical Exam Vitals and nursing note reviewed.  Constitutional:      Appearance: He is well-developed. He is not diaphoretic.  HENT:     Head: Normocephalic and atraumatic.     Right Ear: External ear normal.     Left Ear: External ear normal.  Eyes:     General: No scleral icterus.       Right eye: No discharge.        Left eye: No discharge.     Conjunctiva/sclera: Conjunctivae normal.  Neck:     Trachea: No tracheal deviation.  Cardiovascular:  Rate and Rhythm: Regular rhythm. Tachycardia present.  Pulmonary:     Effort: Pulmonary effort is normal. No respiratory distress.     Breath sounds: Normal breath sounds. No stridor. No wheezing or rales.  Abdominal:     General: Bowel sounds are normal. There is no distension.     Palpations: Abdomen is soft.     Tenderness: There is no abdominal tenderness. There is no guarding or rebound.  Musculoskeletal:        General: No tenderness or deformity.     Cervical back: Neck supple.  Skin:    General: Skin is warm and dry.     Findings: No rash.  Neurological:     General: No focal deficit present.     Mental Status: He is alert.     Cranial Nerves: No cranial nerve deficit, dysarthria or facial asymmetry.     Sensory: No sensory deficit.     Motor: No abnormal muscle tone or seizure activity.      Coordination: Coordination normal.  Psychiatric:        Mood and Affect: Mood normal.     ED Results / Procedures / Treatments   Labs (all labs ordered are listed, but only abnormal results are displayed) Labs Reviewed  RESP PANEL BY RT-PCR (RSV, FLU A&B, COVID)  RVPGX2 - Abnormal; Notable for the following components:      Result Value   Influenza A by PCR POSITIVE (*)    All other components within normal limits  BASIC METABOLIC PANEL - Abnormal; Notable for the following components:   Sodium 134 (*)    CO2 21 (*)    Glucose, Bld 159 (*)    BUN 25 (*)    Creatinine, Ser 1.83 (*)    Calcium 8.5 (*)    GFR, Estimated 38 (*)    All other components within normal limits  CBC - Abnormal; Notable for the following components:   WBC 12.0 (*)    All other components within normal limits  LACTIC ACID, PLASMA  LACTIC ACID, PLASMA  TROPONIN I (HIGH SENSITIVITY)  TROPONIN I (HIGH SENSITIVITY)    EKG None Rate 135 possible sinus or ectopic atrial tachycardia Borderline ST changes diffusely Normal axis, normal QRS duration Radiology DG Chest Portable 1 View  Result Date: 08/26/2022 CLINICAL DATA:  Dyspnea EXAM: PORTABLE CHEST 1 VIEW COMPARISON:  12/30/2021 FINDINGS: Patchy airspace disease at the left greater than right lung base. Possible small left effusion. Stable cardiomediastinal silhouette IMPRESSION: Patchy airspace disease at the left greater than right lung base, suspect for pneumonia. Possible small left effusion. Radiographic follow-up to resolution is recommended. Electronically Signed   By: Donavan Foil M.D.   On: 08/26/2022 18:46    Procedures Procedures    Medications Ordered in ED Medications  sodium chloride 0.9 % bolus 500 mL (0 mLs Intravenous Stopped 08/26/22 1909)    Followed by  0.9 %  sodium chloride infusion (1,000 mLs Intravenous New Bag/Given 08/26/22 1909)  acetaminophen (TYLENOL) tablet 650 mg (650 mg Oral Given 08/26/22 1924)    ED Course/ Medical  Decision Making/ A&P Clinical Course as of 08/26/22 1932  Tue Aug 26, 2022  3154 Basic metabolic panel(!) Electrolyte panel shows elevated creatinine.  Similar to previous [JK]  1905 CBC(!) White blood cell count elevated [JK]  1905 DG Chest Portable 1 View Chest x-ray suggestive of pneumonia [JK]  1930 Case discussed with Dr Myna Hidalgo [JK]    Clinical Course User Index [JK] Dorie Rank,  MD                           Medical Decision Making Problems Addressed: Community acquired pneumonia, unspecified laterality: acute illness or injury Influenza A: acute illness or injury Tachycardia: acute illness or injury  Amount and/or Complexity of Data Reviewed Labs: ordered. Decision-making details documented in ED Course. Radiology: ordered. Decision-making details documented in ED Course.  Risk OTC drugs. Prescription drug management. Decision regarding hospitalization.  Patient presented to the ER for evaluation of fever cough shortness of breath.  Patient's ED workup is consistent with pneumonia.  Chest x-ray does show evidence of infiltrates.  Patient is also positive for influenza.  He may have a combination of bacterial and viral pneumonia.  Patient noted to be tachycardic.  EKG not suggestive of A-fib or a flutter.  Suspect more sinus tachycardia possible ectopic atrial tachycardia more related to his fever and infection.  Patient has been started on IV fluids and IV antibiotics.  Initial troponin unremarkable.  No signs of myocardial ischemia.  Lactic acid level initially ordered.  Results are still pending but will continue to monitor closely for the possibility of evolving sepsis.  Patient clinically appears well.  Will plan on admission to the hospital for IV antibiotics close monitoring.         Final Clinical Impression(s) / ED Diagnoses Final diagnoses:  Community acquired pneumonia, unspecified laterality  Influenza A  Tachycardia    Rx / DC Orders ED Discharge Orders      None         Dorie Rank, MD 08/26/22 1932

## 2022-08-27 DIAGNOSIS — A419 Sepsis, unspecified organism: Secondary | ICD-10-CM | POA: Diagnosis not present

## 2022-08-27 DIAGNOSIS — J189 Pneumonia, unspecified organism: Secondary | ICD-10-CM | POA: Diagnosis not present

## 2022-08-27 LAB — BASIC METABOLIC PANEL
Anion gap: 8 (ref 5–15)
BUN: 19 mg/dL (ref 8–23)
CO2: 22 mmol/L (ref 22–32)
Calcium: 7.8 mg/dL — ABNORMAL LOW (ref 8.9–10.3)
Chloride: 104 mmol/L (ref 98–111)
Creatinine, Ser: 1.75 mg/dL — ABNORMAL HIGH (ref 0.61–1.24)
GFR, Estimated: 40 mL/min — ABNORMAL LOW (ref >60)
Glucose, Bld: 105 mg/dL — ABNORMAL HIGH (ref 70–99)
Potassium: 4.1 mmol/L (ref 3.5–5.1)
Sodium: 134 mmol/L — ABNORMAL LOW (ref 135–145)

## 2022-08-27 LAB — CBC
HCT: 37 % — ABNORMAL LOW (ref 39.0–52.0)
Hemoglobin: 12.2 g/dL — ABNORMAL LOW (ref 13.0–17.0)
MCH: 30.1 pg (ref 26.0–34.0)
MCHC: 33 g/dL (ref 30.0–36.0)
MCV: 91.4 fL (ref 80.0–100.0)
Platelets: 231 10*3/uL (ref 150–400)
RBC: 4.05 MIL/uL — ABNORMAL LOW (ref 4.22–5.81)
RDW: 13.8 % (ref 11.5–15.5)
WBC: 9.2 10*3/uL (ref 4.0–10.5)
nRBC: 0 % (ref 0.0–0.2)

## 2022-08-27 LAB — PROCALCITONIN: Procalcitonin: 0.16 ng/mL

## 2022-08-27 LAB — MAGNESIUM: Magnesium: 2 mg/dL (ref 1.7–2.4)

## 2022-08-27 MED ORDER — FLUTICASONE PROPIONATE 50 MCG/ACT NA SUSP
1.0000 | Freq: Every day | NASAL | Status: DC
  Administered 2022-08-27 – 2022-08-30 (×3): 1 via NASAL
  Filled 2022-08-27: qty 16

## 2022-08-27 MED ORDER — AMLODIPINE BESYLATE 5 MG PO TABS
5.0000 mg | ORAL_TABLET | Freq: Every day | ORAL | Status: DC
  Administered 2022-08-27: 5 mg via ORAL
  Filled 2022-08-27: qty 1

## 2022-08-27 MED ORDER — SALINE SPRAY 0.65 % NA SOLN
1.0000 | NASAL | Status: DC | PRN
  Filled 2022-08-27: qty 44

## 2022-08-27 MED ORDER — TRAZODONE HCL 100 MG PO TABS
100.0000 mg | ORAL_TABLET | Freq: Every evening | ORAL | Status: DC | PRN
  Administered 2022-08-27 – 2022-08-28 (×2): 100 mg via ORAL
  Filled 2022-08-27: qty 1

## 2022-08-27 MED ORDER — SALINE SPRAY 0.65 % NA SOLN: 1.0000 | NASAL | Status: DC | PRN

## 2022-08-27 MED ORDER — AMLODIPINE BESYLATE 5 MG PO TABS: 5.0000 mg | ORAL_TABLET | Freq: Every day | ORAL | Status: DC

## 2022-08-27 NOTE — ED Notes (Signed)
Takoda Janowiak (pt wife) req call when pt moves to floor [phone]

## 2022-08-27 NOTE — Progress Notes (Addendum)
PROGRESS NOTE    Stanley Juarez  QMV:[id number] DOB: 09-22-1945 DOA: 08/26/2022 PCP: Mayra Neer, MD   Brief Narrative:    Stanley Juarez is a pleasant 77 y.o. male with medical history significant for CAD hypertension, OSA, history of DVT and PE on Xarelto, and right renal oncocytoma status post nephrectomy who presents to the emergency department with cough, congestion, and shortness of breath.  Patient was admitted with sepsis secondary to pneumonia superimposed on influenza A.  He is also noted to have AKI.  He additionally has atrial flutter which would be a new diagnosis likely precipitated by acute respiratory illness.  Assessment & Plan:   Principal Problem:   Sepsis due to pneumonia [facility]) Active Problems:   History of pulmonary embolism   Influenza A with pneumonia   Sleep apnea   AKI (acute kidney injury) ([place])   Hypertension   Atrial flutter by electrocardiogram ([place])  Assessment and Plan:  1. Sepsis secondary to pneumonia; influenza A  - Influenza A positive; CXR concerning for pneumonia and he has productive cough and leukocytosis  - Started Tamiflu, culture blood with no growth to date, procalcitonin low, strep pneumo antigen negative   2. AKI-improving - SCr is 1.83 on admission; was 1.89 on Sept 16th but 1.18 on Sept 5th and this is suspected to be acute  - Hold lisinopril, check UA with microscopy, continue IVF hydration, renally-dose medications, repeat chem panel in am   -Wife gave patient his lisinopril from home medication on 1/3   3. Atrial flutter, now sinus tachycardia - Appears to be in rapid atrial flutter in ED which would be a new diagnosis; rate has been in 120-130s with stable BP   -EKG with noted sinus tachycardia - Likely precipitated by the acute respiratory illness  - Treat underlying respiratory illness, continue Xarelto for CVA prevention, use diltiazem for rate-control (EF was preserved on TTE in August 2022)    4. Hypertension  -Resume  home amlodipine, stop Cardizem pushes -Labetalol ordered for blood pressure control   5. 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 OSA  - CPAP qHS  -Resumed home trazodone at bedtime  7. Obesity -BMI 32.86    DVT prophylaxis: Xarelto Code Status: Full Family Communication: Wife at bedside Disposition Plan:  Status is: Inpatient Remains inpatient appropriate because: Need for IV medications.   Consultants:  None  Procedures:  None  Antimicrobials:  Anti-infectives (From admission, onward)    Start     Dose/Rate Route Frequency Ordered Stop   08/27/22 1000  oseltamivir (TAMIFLU) capsule 30 mg        30 mg Oral 2 times daily 08/26/22 1948 08/31/22 2159   08/26/22 2100  levofloxacin (LEVAQUIN) IVPB 750 mg        750 mg 100 mL/hr over 90 Minutes Intravenous Every 48 hours 08/26/22 1944     08/26/22 2000  oseltamivir (TAMIFLU) capsule 75 mg        75 mg Oral  Once 08/26/22 1948 08/26/22 2109      Subjective: Patient seen and evaluated today with tiredness due to difficulty in sleeping.  He would like something to help him sleep.  He denies any chest pain, shortness of breath, or palpitations.  Objective: Vitals:   08/27/22 0237 08/27/22 0315 08/27/22 0600 08/27/22 0640  BP:  (!) 122/94 122/89   Pulse:  (!) 120 (!) 114  Resp:  20 (!) 21   Temp: 98 F (36.7 C)   98.3 F (36.8 C)  TempSrc: Oral   Oral  SpO2:  99% 96%   Weight:      Height:        Intake/Output Summary (Last 24 hours) at 08/27/2022 0730 Last data filed at 08/27/2022 0542 Gross per 24 hour  Intake 807.92 ml  Output --  Net 807.92 ml   Filed Weights   08/26/22 1725  Weight: 103.9 kg    Examination:  General exam: Appears calm and comfortable  Respiratory system: Clear to auscultation. Respiratory effort normal. Cardiovascular system: S1 & S2 heard, RRR.  Tachycardic. Gastrointestinal system: Abdomen is soft Central  nervous system: Alert and awake Extremities: No edema Skin: No significant lesions noted Psychiatry: Flat affect.    Data Reviewed: I have personally reviewed following labs and imaging studies  CBC: Recent Labs  Lab 08/26/22 1750 08/27/22 0420  WBC 12.0* 9.2  HGB 14.4 12.2*  HCT 44.0 37.0*  MCV 91.9 91.4  PLT 257 737   Basic Metabolic Panel: Recent Labs  Lab 08/26/22 1750 08/27/22 0420  NA 134* 134*  K 5.0 4.1  CL 102 104  CO2 21* 22  GLUCOSE 159* 105*  BUN 25* 19  CREATININE 1.83* 1.75*  CALCIUM 8.5* 7.8*  MG  --  2.0   GFR: Estimated Creatinine Clearance: 44.7 mL/min (A) (by C-G formula based on SCr of 1.75 mg/dL (H)). Liver Function Tests: No results for input(s): "AST", "ALT", "ALKPHOS", "BILITOT", "PROT", "ALBUMIN" in the last 168 hours. No results for input(s): "LIPASE", "AMYLASE" in the last 168 hours. No results for input(s): "AMMONIA" in the last 168 hours. Coagulation Profile: No results for input(s): "INR", "PROTIME" in the last 168 hours. Cardiac Enzymes: No results for input(s): "CKTOTAL", "CKMB", "CKMBINDEX", "TROPONINI" in the last 168 hours. BNP (last 3 results) No results for input(s): "PROBNP" in the last 8760 hours. HbA1C: No results for input(s): "HGBA1C" in the last 72 hours. CBG: No results for input(s): "GLUCAP" in the last 168 hours. Lipid Profile: No results for input(s): "CHOL", "HDL", "LDLCALC", "TRIG", "CHOLHDL", "LDLDIRECT" in the last 72 hours. Thyroid Function Tests: No results for input(s): "TSH", "T4TOTAL", "FREET4", "T3FREE", "THYROIDAB" in the last 72 hours. Anemia Panel: No results for input(s): "VITAMINB12", "FOLATE", "FERRITIN", "TIBC", "IRON", "RETICCTPCT" in the last 72 hours. Sepsis Labs: Recent Labs  Lab 08/26/22 1800 08/26/22 1922 08/27/22 0420  PROCALCITON 0.27  --  0.16  LATICACIDVEN  --  0.8  --     Recent Results (from the past 240 hour(s))  Resp panel by RT-PCR (RSV, Flu A&B, Covid) Anterior Nasal  Swab     Status: Abnormal   Collection Time: 08/26/22  6:19 PM   Specimen: Anterior Nasal Swab  Result Value Ref Range Status   SARS Coronavirus 2 by RT PCR NEGATIVE NEGATIVE Final    Comment: (NOTE) SARS-CoV-2 target nucleic acids are NOT DETECTED.  The SARS-CoV-2 RNA is generally detectable in upper respiratory specimens during the acute phase of infection. The lowest concentration of SARS-CoV-2 viral copies this assay can detect is 138 copies/mL. A negative result does not preclude SARS-Cov-2 infection and should not be used as the sole basis for treatment or other patient management decisions. A negative result may occur with  improper specimen collection/handling, submission of specimen other than nasopharyngeal swab, presence of viral mutation(s) within the areas targeted by this assay, and inadequate number of viral copies(<138 copies/mL). A negative result must  be combined with clinical observations, patient history, and epidemiological information. The expected result is Negative.  Fact Sheet for Patients:  EntrepreneurPulse.com.au  Fact Sheet for Healthcare Providers:  IncredibleEmployment.be  This test is no t yet approved or cleared by the [place] FDA and  has been authorized for detection and/or diagnosis of SARS-CoV-2 by FDA under an Emergency Use Authorization (EUA). This EUA will remain  in effect (meaning this test can be used) for the duration of the COVID-19 declaration under Section 564(b)(1) of the Act, 21 U.S.C.section 360bbb-3(b)(1), unless the authorization is terminated  or revoked sooner.       Influenza A by PCR POSITIVE (A) NEGATIVE Final   Influenza B by PCR NEGATIVE NEGATIVE Final    Comment: (NOTE) The Xpert Xpress SARS-CoV-2/FLU/RSV plus assay is intended as an aid in the diagnosis of influenza from Nasopharyngeal swab specimens and should not be used as a sole basis for treatment. Nasal washings  and aspirates are unacceptable for Xpert Xpress SARS-CoV-2/FLU/RSV testing.  Fact Sheet for Patients: EntrepreneurPulse.com.au  Fact Sheet for Healthcare Providers: IncredibleEmployment.be  This test is not yet approved or cleared by the [place] FDA and has been authorized for detection and/or diagnosis of SARS-CoV-2 by FDA under an Emergency Use Authorization (EUA). This EUA will remain in effect (meaning this test can be used) for the duration of the COVID-19 declaration under Section 564(b)(1) of the Act, 21 U.S.C. section 360bbb-3(b)(1), unless the authorization is terminated or revoked.     Resp Syncytial Virus by PCR NEGATIVE NEGATIVE Final    Comment: (NOTE) Fact Sheet for Patients: EntrepreneurPulse.com.au  Fact Sheet for Healthcare Providers: IncredibleEmployment.be  This test is not yet approved or cleared by the [place] FDA and has been authorized for detection and/or diagnosis of SARS-CoV-2 by FDA under an Emergency Use Authorization (EUA). This EUA will remain in effect (meaning this test can be used) for the duration of the COVID-19 declaration under Section 564(b)(1) of the Act, 21 U.S.C. section 360bbb-3(b)(1), unless the authorization is terminated or revoked.  Performed at [facility], [place] [address] [place], [place] [address]          Radiology Studies: DG Chest Portable 1 View  Result Date: 08/26/2022 CLINICAL DATA:  Dyspnea EXAM: PORTABLE CHEST 1 VIEW COMPARISON:  12/30/2021 FINDINGS: Patchy airspace disease at the left greater than right lung base. Possible small left effusion. Stable cardiomediastinal silhouette IMPRESSION: Patchy airspace disease at the left greater than right lung base, suspect for pneumonia. Possible small left effusion. Radiographic follow-up to resolution is recommended. Electronically Signed   By: Donavan Foil M.D.   On:  08/26/2022 18:46        Scheduled Meds:  oseltamivir  30 mg Oral BID   rivaroxaban  20 mg Oral Q supper   sodium chloride flush  3 mL Intravenous Q12H   Continuous Infusions:  sodium chloride 100 mL/hr at 08/27/22 0725   levofloxacin (LEVAQUIN) IV Stopped (08/26/22 2318)     LOS: 1 day    Time spent: 35 minutes    Shakyra Mattera Darleen Crocker, DO [facility]  If 7PM-7AM, please contact night-coverage [email or web address] 08/27/2022, 7:30 AM

## 2022-08-27 NOTE — ED Notes (Addendum)
Wife informed this nurse that she gave pt his Lisinopril this morning.

## 2022-08-28 ENCOUNTER — Inpatient Hospital Stay (HOSPITAL_COMMUNITY): Payer: Medicare HMO

## 2022-08-28 DIAGNOSIS — A419 Sepsis, unspecified organism: Secondary | ICD-10-CM | POA: Diagnosis not present

## 2022-08-28 DIAGNOSIS — I4892 Unspecified atrial flutter: Secondary | ICD-10-CM | POA: Diagnosis not present

## 2022-08-28 DIAGNOSIS — J189 Pneumonia, unspecified organism: Secondary | ICD-10-CM | POA: Diagnosis not present

## 2022-08-28 LAB — ECHOCARDIOGRAM LIMITED
Height: 70 in
S' Lateral: 3.6 cm
Weight: 3664 oz

## 2022-08-28 LAB — CBC
HCT: 40.4 % (ref 39.0–52.0)
Hemoglobin: 13.1 g/dL (ref 13.0–17.0)
MCH: 29.8 pg (ref 26.0–34.0)
MCHC: 32.4 g/dL (ref 30.0–36.0)
MCV: 92 fL (ref 80.0–100.0)
Platelets: 291 10*3/uL (ref 150–400)
RBC: 4.39 MIL/uL (ref 4.22–5.81)
RDW: 13.6 % (ref 11.5–15.5)
WBC: 7.6 10*3/uL (ref 4.0–10.5)
nRBC: 0 % (ref 0.0–0.2)

## 2022-08-28 LAB — BASIC METABOLIC PANEL
Anion gap: 8 (ref 5–15)
BUN: 18 mg/dL (ref 8–23)
CO2: 21 mmol/L — ABNORMAL LOW (ref 22–32)
Calcium: 8.6 mg/dL — ABNORMAL LOW (ref 8.9–10.3)
Chloride: 108 mmol/L (ref 98–111)
Creatinine, Ser: 1.65 mg/dL — ABNORMAL HIGH (ref 0.61–1.24)
GFR, Estimated: 43 mL/min — ABNORMAL LOW (ref >60)
Glucose, Bld: 114 mg/dL — ABNORMAL HIGH (ref 70–99)
Potassium: 4.5 mmol/L (ref 3.5–5.1)
Sodium: 137 mmol/L (ref 135–145)

## 2022-08-28 LAB — TSH: TSH: 2.413 u[IU]/mL (ref 0.350–4.500)

## 2022-08-28 LAB — PROCALCITONIN: Procalcitonin: 0.11 ng/mL

## 2022-08-28 LAB — MAGNESIUM: Magnesium: 1.9 mg/dL (ref 1.7–2.4)

## 2022-08-28 MED ORDER — DILTIAZEM HCL-DEXTROSE 125-5 MG/125ML-% IV SOLN (PREMIX)
5.0000 mg/h | INTRAVENOUS | Status: DC
  Administered 2022-08-28: 5 mg/h via INTRAVENOUS
  Administered 2022-08-29 – 2022-08-30 (×3): 10 mg/h via INTRAVENOUS
  Filled 2022-08-28 (×5): qty 125

## 2022-08-28 MED ORDER — DILTIAZEM LOAD VIA INFUSION
10.0000 mg | Freq: Once | INTRAVENOUS | Status: AC
  Administered 2022-08-28: 10 mg via INTRAVENOUS
  Filled 2022-08-28: qty 10

## 2022-08-28 NOTE — Progress Notes (Signed)
  Echocardiogram 2D Echocardiogram has been performed.  [name] M 08/28/2022, 9:50 AM

## 2022-08-28 NOTE — Progress Notes (Signed)
PROGRESS NOTE    Stanley Juarez  XKG:[id number] DOB: 09-Feb-1946 DOA: 08/26/2022 PCP: Mayra Neer, MD   Brief Narrative:    Stanley Juarez is a pleasant 77 y.o. male with medical history significant for CAD hypertension, OSA, history of DVT and PE on Xarelto, and right renal oncocytoma status post nephrectomy who presents to the emergency department with cough, congestion, and shortness of breath.  Patient was admitted with sepsis secondary to pneumonia superimposed on influenza A.  He is also noted to have AKI.  He additionally has atrial flutter/fib which would be a new diagnosis likely precipitated by acute respiratory illness.  He will be started on Cardizem drip 1/4 and TSH and 2D echocardiogram are pending.  Cardiology consulted for assistance in management.  Assessment & Plan:   Principal Problem:   Sepsis due to pneumonia [facility]) Active Problems:   History of pulmonary embolism   Influenza A with pneumonia   Sleep apnea   AKI (acute kidney injury) ([place])   Hypertension   Atrial flutter by electrocardiogram ([place])  Assessment and Plan:   1. Sepsis secondary to pneumonia; influenza A  - Influenza A positive; CXR concerning for pneumonia and he has productive cough and leukocytosis  - Started Tamiflu, culture blood with no growth to date, procalcitonin low, strep pneumo antigen negative   2. AKI-improving - SCr is 1.83 on admission; was 1.89 on Sept 16th but 1.18 on Sept 5th and this is suspected to be acute  - Hold lisinopril, check UA with microscopy, continue IVF hydration, renally-dose medications, repeat chem panel in am   -Wife gave patient his lisinopril from home medication on 1/3   3. Atrial flutter/fib - Appears to be in rapid atrial flutter in ED which would be a new diagnosis; rate has been in 120-130s with stable BP   -EKG with noted sinus tachycardia - Likely precipitated by the acute respiratory illness  - Treat underlying respiratory illness, continue Xarelto  for CVA prevention(EF was preserved on TTE in August 2022)  -Initially appeared to be sinus tachycardia, but has had periods of atrial fibrillation overnight.  Could be atrial flutter with 2:1 conduction as well, but rate appears to be is too slow for that.  Started on Cardizem drip 1/4.  Appreciate cardiology evaluation. -Limited 2D echocardiogram pending as well as TSH   4. Hypertension  -Hold home amlodipine and Cardizem drip initiated, monitor carefully -Labetalol ordered for blood pressure control   5. 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 OSA  - CPAP qHS  -Resumed home trazodone at bedtime   7. Obesity -BMI 32.86       DVT prophylaxis: Xarelto Code Status: Full Family Communication: Wife at bedside 1/4 Disposition Plan:  Status is: Inpatient Remains inpatient appropriate because: Need for IV medications.     Consultants:  Cardiology   Procedures:  None   Antimicrobials:  Anti-infectives (From admission, onward)    Start     Dose/Rate Route Frequency Ordered Stop   08/27/22 1000  oseltamivir (TAMIFLU) capsule 30 mg        30 mg Oral 2 times daily 08/26/22 1948 08/31/22 2159   08/26/22 2100  levofloxacin (LEVAQUIN) IVPB 750 mg        750 mg 100 mL/hr over 90 Minutes Intravenous Every 48 hours 08/26/22 1944     08/26/22 2000  oseltamivir (TAMIFLU) capsule 75 mg  75 mg Oral  Once 08/26/22 1948 08/26/22 2109      Subjective: Patient seen and evaluated today with ongoing mild shortness of breath this morning with elevated heart rates noted. No acute concerns or events noted overnight.  He was able to sleep better overnight.  He denies any chest pain.  Objective: Vitals:   08/28/22 0000 08/28/22 0300 08/28/22 0600 08/28/22 0800  BP: 125/86 134/77 130/83 122/81  Pulse: (!) 115 (!) 116 (!) 125 (!) 128  Resp: (!) 22 17 (!) 21 (!) 21  Temp: 98.1 F (36.7 C) 97.7 F (36.5 C)  97.6 F (36.4  C)  TempSrc: Oral Oral    SpO2: 95% 97% 95% 95%  Weight:      Height:        Intake/Output Summary (Last 24 hours) at 08/28/2022 1053 Last data filed at 08/28/2022 0300 Gross per 24 hour  Intake 872.9 ml  Output 2150 ml  Net -1277.1 ml   Filed Weights   08/26/22 1725  Weight: 103.9 kg    Examination:  General exam: Appears calm and comfortable  Respiratory system: Clear to auscultation. Respiratory effort normal. Cardiovascular system: S1 & S2 heard, tachycardic. Gastrointestinal system: Abdomen is soft Central nervous system: Alert and awake Extremities: No edema Skin: No significant lesions noted Psychiatry: Flat affect.    Data Reviewed: I have personally reviewed following labs and imaging studies  CBC: Recent Labs  Lab 08/26/22 1750 08/27/22 0420 08/28/22 0258  WBC 12.0* 9.2 7.6  HGB 14.4 12.2* 13.1  HCT 44.0 37.0* 40.4  MCV 91.9 91.4 92.0  PLT 257 231 220   Basic Metabolic Panel: Recent Labs  Lab 08/26/22 1750 08/27/22 0420 08/28/22 0258  NA 134* 134* 137  K 5.0 4.1 4.5  CL 102 104 108  CO2 21* 22 21*  GLUCOSE 159* 105* 114*  BUN 25* 19 18  CREATININE 1.83* 1.75* 1.65*  CALCIUM 8.5* 7.8* 8.6*  MG  --  2.0 1.9   GFR: Estimated Creatinine Clearance: 47.4 mL/min (A) (by C-G formula based on SCr of 1.65 mg/dL (H)). Liver Function Tests: No results for input(s): "AST", "ALT", "ALKPHOS", "BILITOT", "PROT", "ALBUMIN" in the last 168 hours. No results for input(s): "LIPASE", "AMYLASE" in the last 168 hours. No results for input(s): "AMMONIA" in the last 168 hours. Coagulation Profile: No results for input(s): "INR", "PROTIME" in the last 168 hours. Cardiac Enzymes: No results for input(s): "CKTOTAL", "CKMB", "CKMBINDEX", "TROPONINI" in the last 168 hours. BNP (last 3 results) No results for input(s): "PROBNP" in the last 8760 hours. HbA1C: No results for input(s): "HGBA1C" in the last 72 hours. CBG: No results for input(s): "GLUCAP" in the last  168 hours. Lipid Profile: No results for input(s): "CHOL", "HDL", "LDLCALC", "TRIG", "CHOLHDL", "LDLDIRECT" in the last 72 hours. Thyroid Function Tests: No results for input(s): "TSH", "T4TOTAL", "FREET4", "T3FREE", "THYROIDAB" in the last 72 hours. Anemia Panel: No results for input(s): "VITAMINB12", "FOLATE", "FERRITIN", "TIBC", "IRON", "RETICCTPCT" in the last 72 hours. Sepsis Labs: Recent Labs  Lab 08/26/22 1800 08/26/22 1922 08/27/22 0420 08/28/22 0258  PROCALCITON 0.27  --  0.16 0.11  LATICACIDVEN  --  0.8  --   --     Recent Results (from the past 240 hour(s))  Resp panel by RT-PCR (RSV, Flu A&B, Covid) Anterior Nasal Swab     Status: Abnormal   Collection Time: 08/26/22  6:19 PM   Specimen: Anterior Nasal Swab  Result Value Ref Range Status   SARS Coronavirus 2  by RT PCR NEGATIVE NEGATIVE Final    Comment: (NOTE) SARS-CoV-2 target nucleic acids are NOT DETECTED.  The SARS-CoV-2 RNA is generally detectable in upper respiratory specimens during the acute phase of infection. The lowest concentration of SARS-CoV-2 viral copies this assay can detect is 138 copies/mL. A negative result does not preclude SARS-Cov-2 infection and should not be used as the sole basis for treatment or other patient management decisions. A negative result may occur with  improper specimen collection/handling, submission of specimen other than nasopharyngeal swab, presence of viral mutation(s) within the areas targeted by this assay, and inadequate number of viral copies(<138 copies/mL). A negative result must be combined with clinical observations, patient history, and epidemiological information. The expected result is Negative.  Fact Sheet for Patients:  EntrepreneurPulse.com.au  Fact Sheet for Healthcare Providers:  IncredibleEmployment.be  This test is no t yet approved or cleared by the [place] FDA and  has been authorized for detection and/or  diagnosis of SARS-CoV-2 by FDA under an Emergency Use Authorization (EUA). This EUA will remain  in effect (meaning this test can be used) for the duration of the COVID-19 declaration under Section 564(b)(1) of the Act, 21 U.S.C.section 360bbb-3(b)(1), unless the authorization is terminated  or revoked sooner.       Influenza A by PCR POSITIVE (A) NEGATIVE Final   Influenza B by PCR NEGATIVE NEGATIVE Final    Comment: (NOTE) The Xpert Xpress SARS-CoV-2/FLU/RSV plus assay is intended as an aid in the diagnosis of influenza from Nasopharyngeal swab specimens and should not be used as a sole basis for treatment. Nasal washings and aspirates are unacceptable for Xpert Xpress SARS-CoV-2/FLU/RSV testing.  Fact Sheet for Patients: EntrepreneurPulse.com.au  Fact Sheet for Healthcare Providers: IncredibleEmployment.be  This test is not yet approved or cleared by the [place] FDA and has been authorized for detection and/or diagnosis of SARS-CoV-2 by FDA under an Emergency Use Authorization (EUA). This EUA will remain in effect (meaning this test can be used) for the duration of the COVID-19 declaration under Section 564(b)(1) of the Act, 21 U.S.C. section 360bbb-3(b)(1), unless the authorization is terminated or revoked.     Resp Syncytial Virus by PCR NEGATIVE NEGATIVE Final    Comment: (NOTE) Fact Sheet for Patients: EntrepreneurPulse.com.au  Fact Sheet for Healthcare Providers: IncredibleEmployment.be  This test is not yet approved or cleared by the [place] FDA and has been authorized for detection and/or diagnosis of SARS-CoV-2 by FDA under an Emergency Use Authorization (EUA). This EUA will remain in effect (meaning this test can be used) for the duration of the COVID-19 declaration under Section 564(b)(1) of the Act, 21 U.S.C. section 360bbb-3(b)(1), unless the authorization is terminated  or revoked.  Performed at [facility], [place] [address], [place], [place] [address]   Culture, blood (routine x 2) Call MD if unable to obtain prior to antibiotics being given     Status: None (Preliminary result)   Collection Time: 08/26/22 10:14 PM   Specimen: BLOOD LEFT HAND  Result Value Ref Range Status   Specimen Description   Final    BLOOD LEFT HAND Performed at [facility] [address], [place], [place] [address]    Special Requests   Final    BOTTLES DRAWN AEROBIC AND ANAEROBIC Blood Culture adequate volume Performed at [facility] [address], [place], [place] [address]    Culture   Final    NO GROWTH 1 DAY Performed at [facility], [place] [address] [place], [place] [address]    Report Status PENDING  Incomplete  Culture, blood (routine x 2) Call MD if unable to obtain prior to antibiotics being given     Status: None (Preliminary result)   Collection Time: 08/26/22 10:16 PM   Specimen: BLOOD  Result Value Ref Range Status   Specimen Description   Final    BLOOD RIGHT ANTECUBITAL Performed at [facility], [place] [address] [place], [place] [address]    Special Requests   Final    BOTTLES DRAWN AEROBIC AND ANAEROBIC Blood Culture adequate volume Performed at [facility] [address] [place], [place] [address]    Culture   Final    NO GROWTH 1 DAY Performed at [facility], [place] [address] [place], [place] [address]    Report Status PENDING  Incomplete         Radiology Studies: DG Chest Portable 1 View  Result Date: 08/26/2022 CLINICAL DATA:  Dyspnea EXAM: PORTABLE CHEST 1 VIEW COMPARISON:  12/30/2021 FINDINGS: Patchy airspace disease at the left greater than right lung base. Possible small left effusion. Stable cardiomediastinal silhouette IMPRESSION: Patchy airspace disease at the left greater than right lung base, suspect for pneumonia. Possible small  left effusion. Radiographic follow-up to resolution is recommended. Electronically Signed   By: Donavan Foil M.D.   On: 08/26/2022 18:46        Scheduled Meds:  diltiazem  10 mg Intravenous Once   fluticasone  1 spray Each Nare Daily   oseltamivir  30 mg Oral BID   rivaroxaban  20 mg Oral Q supper   sodium chloride flush  3 mL Intravenous Q12H   Continuous Infusions:  diltiazem (CARDIZEM) infusion     levofloxacin (LEVAQUIN) IV Stopped (08/26/22 2318)     LOS: 2 days    Time spent: 35 minutes    Lynsi Dooner Darleen Crocker, DO [facility]  If 7PM-7AM, please contact night-coverage [email or web address] 08/28/2022, 10:53 AM

## 2022-08-29 DIAGNOSIS — J189 Pneumonia, unspecified organism: Secondary | ICD-10-CM | POA: Diagnosis not present

## 2022-08-29 DIAGNOSIS — A419 Sepsis, unspecified organism: Secondary | ICD-10-CM | POA: Diagnosis not present

## 2022-08-29 LAB — CBC
HCT: 40 % (ref 39.0–52.0)
Hemoglobin: 13 g/dL (ref 13.0–17.0)
MCH: 30 pg (ref 26.0–34.0)
MCHC: 32.5 g/dL (ref 30.0–36.0)
MCV: 92.2 fL (ref 80.0–100.0)
Platelets: 362 10*3/uL (ref 150–400)
RBC: 4.34 MIL/uL (ref 4.22–5.81)
RDW: 13.6 % (ref 11.5–15.5)
WBC: 7.3 10*3/uL (ref 4.0–10.5)
nRBC: 0 % (ref 0.0–0.2)

## 2022-08-29 LAB — MAGNESIUM: Magnesium: 1.9 mg/dL (ref 1.7–2.4)

## 2022-08-29 LAB — BASIC METABOLIC PANEL
Anion gap: 9 (ref 5–15)
BUN: 26 mg/dL — ABNORMAL HIGH (ref 8–23)
CO2: 21 mmol/L — ABNORMAL LOW (ref 22–32)
Calcium: 8.3 mg/dL — ABNORMAL LOW (ref 8.9–10.3)
Chloride: 107 mmol/L (ref 98–111)
Creatinine, Ser: 1.68 mg/dL — ABNORMAL HIGH (ref 0.61–1.24)
GFR, Estimated: 42 mL/min — ABNORMAL LOW (ref >60)
Glucose, Bld: 119 mg/dL — ABNORMAL HIGH (ref 70–99)
Potassium: 4.1 mmol/L (ref 3.5–5.1)
Sodium: 137 mmol/L (ref 135–145)

## 2022-08-29 LAB — LEGIONELLA PNEUMOPHILA SEROGP 1 UR AG: L. pneumophila Serogp 1 Ur Ag: NEGATIVE

## 2022-08-29 MED ORDER — METOPROLOL TARTRATE 25 MG PO TABS
25.0000 mg | ORAL_TABLET | Freq: Two times a day (BID) | ORAL | Status: DC
  Administered 2022-08-29 – 2022-08-30 (×3): 25 mg via ORAL
  Filled 2022-08-29 (×3): qty 1

## 2022-08-29 NOTE — TOC Initial Note (Signed)
Transition of Care [facility]) - Initial/Assessment Note    Patient Details  Name: Stanley Juarez MRN: [id number] Date of Birth: 1946-05-07  Transition of Care [facility]) CM/SW Contact:    Leeroy Cha, RN Phone Number: 08/29/2022, 7:42 AM  Clinical Narrative:                  Transition of Care [facility]) Screening Note   Patient Details  Name: Stanley Juarez Date of Birth: September 02, 1945   Transition of Care [facility]) CM/SW Contact:    Leeroy Cha, RN Phone Number: 08/29/2022, 7:42 AM    Transition of Care Department [facility]) has reviewed patient and no TOC needs have been identified at this time. We will continue to monitor patient advancement through interdisciplinary progression rounds. If new patient transition needs arise, please place a TOC consult.    Expected Discharge Plan: Home/Self Care Barriers to Discharge: Continued Medical Work up   Patient Goals and CMS Choice Patient states their goals for this hospitalization and ongoing recovery are:: to get better and go home CMS Medicare.gov Compare Post Acute Care list provided to:: Patient        Expected Discharge Plan and Services   Discharge Planning Services: CM Consult   Living arrangements for the past 2 months: Single Family Home                                      Prior Living Arrangements/Services Living arrangements for the past 2 months: Single Family Home Lives with:: Spouse Patient language and need for interpreter reviewed:: Yes Do you feel safe going back to the place where you live?: Yes            Criminal Activity/Legal Involvement Pertinent to Current Situation/Hospitalization: No - Comment as needed  Activities of Daily Living Home Assistive Devices/Equipment: None ADL Screening (condition at time of admission) Patient's cognitive ability adequate to safely complete daily activities?: Yes Is the patient deaf or have difficulty hearing?: Yes Does the patient have difficulty seeing,  even when wearing glasses/contacts?: No Does the patient have difficulty concentrating, remembering, or making decisions?: No Patient able to express need for assistance with ADLs?: Yes Does the patient have difficulty dressing or bathing?: No Independently performs ADLs?: Yes (appropriate for developmental age) Does the patient have difficulty walking or climbing stairs?: No Weakness of Legs: None Weakness of Arms/Hands: None  Permission Sought/Granted                  Emotional Assessment Appearance:: Appears stated age Attitude/Demeanor/Rapport: Engaged Affect (typically observed): Appropriate Orientation: : Oriented to Self, Oriented to Place, Oriented to  Time, Oriented to Situation Alcohol / Substance Use: Never Used Psych Involvement: No (comment)  Admission diagnosis:  Tachycardia [R00.0] Influenza A [J10.1] Sepsis due to pneumonia ([place]) [J18.9, A41.9] Community acquired pneumonia, unspecified laterality [J18.9] Patient Active Problem List   Diagnosis Date Noted   Sepsis due to pneumonia ([place]) 08/26/2022   Influenza A with pneumonia 08/26/2022   Sleep apnea 08/26/2022   AKI (acute kidney injury) ([place]) 08/26/2022   Hypertension 08/26/2022   Atrial flutter by electrocardiogram ([place]) 08/26/2022   Renal mass 05/09/2022   Acute deep vein thrombosis (DVT) of right lower extremity ([place]) 01/22/2022   Neck pain 01/22/2022   Bilateral renal masses 12/27/2021   History of pulmonary embolism 12/24/2021   PCP:  Mayra Neer, MD Pharmacy:   [facility] [place], [place] [address] Phone: [phone] Fax: [phone]     Social Determinants of Health (SDOH) Social History: [place]: No Food Insecurity (08/28/2022)  Housing: Low Risk  (08/28/2022)  Transportation Needs: No Transportation Needs (08/28/2022)  Utilities: Not At Risk (08/28/2022)  Tobacco Use:  Low Risk  (08/26/2022)   SDOH Interventions:     Readmission Risk Interventions   No data to display

## 2022-08-29 NOTE — Consult Note (Addendum)
Cardiology Consultation   Patient ID: TYLEK BONEY MRN: [id number]; DOB: 22-Jul-1946  Admit date: 08/26/2022 Date of Consult: 08/29/2022  PCP:  Mayra Neer, [facility] Cardiologist:  Kirk Ruths, MD New    Patient Profile:   MIKAH POSS is a 77 y.o. male with a hx of HTN, OSA, history of DVT and PE on Xarelto, right renal oncocytoma s/p nephrectomy  who is being seen 08/29/2022 for the evaluation of atrial fib/flutter at the request of Dr. Manuella Ghazi .  History of Present Illness:   Mr. Torregrossa is a 77 year old male with above medical history. Patient denies having any cardiac history, and has never seen a cardiologist. Echo from 03/2022 showed EF 60-65%, grade II diastolic dysfunction.   Patient presented to the ED on 1/2. He had been sent to the ED from urgent care after workup showed pneumonia. In the ED, patient was positive for influenza A, and CXR showed patchy airspace disease at the L>R lung base, suspicious for pneumonia. Found to be in atrial flutter/fib in the ED with HR in the 120s-130s. hsTn 15. He was admitted to the [facility] service for treatment of sepsis secondary to PNA, influenza A. He has a history of PE/DVT, so he remained on his home xarelto 20 mg daily.   Cardiology asked to evaluate for atrial flutter/fib. On my review of telemetry, appears that patient is in atrial fibrillation with HR in the 100s-120s. Echocardiogram on 1/4 showed EF 55-60%, mild LVH, normal RV systolic function. Patient reports that he can occasionally feel a fluttering in his chest, especially when he tries to go to sleep. Prior to admission, he had felt this fluttering in his chest before, but it is difficult to say how often or when it first began.  Denies having chest pain or SOB at rest. When he first came to the hospital, he had a productive cough. Now has a dry cough. He had some diarrhea last week that has resolved. Has been fatigued for the past month. Denies abdominal  distention, ankle edema.   Past Medical History:  Diagnosis Date   Arthritis    back   Cancer ([place])    Hypertension    Pneumonia    Sleep apnea     Past Surgical History:  Procedure Laterality Date   CATARACT EXTRACTION     IR ANGIOGRAM PULMONARY BILATERAL SELECTIVE  12/25/2021   IR INFUSION THROMBOL ARTERIAL INITIAL (MS)  12/25/2021   IR INFUSION THROMBOL ARTERIAL INITIAL (MS)  12/25/2021   IR RADIOLOGIST EVAL & MGMT  01/30/2022   IR THROMB F/U EVAL ART/VEN FINAL DAY (MS)  12/25/2021   IR US GUIDE VASC ACCESS RIGHT  12/25/2021   IR US GUIDE VASC ACCESS RIGHT  12/25/2021   MENISCUS REPAIR Right    ROBOT ASSISTED LAPAROSCOPIC NEPHRECTOMY Right 05/09/2022   Procedure: XI ROBOTIC ASSISTED LAPAROSCOPIC NEPHRECTOMY;  Surgeon: Ceasar Mons, MD;  Location: [facility];  Service: Urology;  Laterality: Right;  ONLY NEEDS 180 MIN   ROTATOR CUFF REPAIR Right    skin cancer removal         Inpatient Medications: Scheduled Meds:  fluticasone  1 spray Each Nare Daily   metoprolol tartrate  25 mg Oral BID   oseltamivir  30 mg Oral BID   rivaroxaban  20 mg Oral Q supper   sodium chloride flush  3 mL Intravenous Q12H   Continuous Infusions:  diltiazem (CARDIZEM) infusion 10 mg/hr (08/29/22 0110)  levofloxacin (LEVAQUIN) IV 750 mg (08/28/22 2131)   PRN Meds: acetaminophen **OR** acetaminophen, guaiFENesin-dextromethorphan, labetalol, ondansetron **OR** ondansetron (ZOFRAN) IV, oxyCODONE, senna-docusate, sodium chloride, traZODone  Allergies:    Allergies  Allergen Reactions   Prednisone     Constipation   Penicillins Swelling and Rash    Did it involve swelling of the face/tongue/throat, SOB, or low BP? Yes Did it involve sudden or severe rash/hives, skin peeling, or any reaction on the inside of your mouth or nose? Yes Did you need to seek medical attention at a hospital or doctor's office? No When did it last happen?    childhood   If all above answers are "NO", may  proceed with cephalosporin use.      Social History:   Social History   Socioeconomic History   Marital status: Married    Spouse name: Not on file   Number of children: Not on file   Years of education: Not on file   Highest education level: Not on file  Occupational History   Not on file  Tobacco Use   Smoking status: Never   Smokeless tobacco: Never  Vaping Use   Vaping Use: Never used  Substance and Sexual Activity   Alcohol use: Never   Drug use: Never   Sexual activity: Not on file  Other Topics Concern   Not on file  Social History Narrative   Not on file   Social Determinants of Health   Financial Resource Strain: Not on file  Food Insecurity: No Food Insecurity (08/28/2022)   Hunger Vital Sign    Worried About Running Out of Food in the Last Year: Never true    Ran Out of Food in the Last Year: Never true  Transportation Needs: No Transportation Needs (08/28/2022)   PRAPARE - Hydrologist (Medical): No    Lack of Transportation (Non-Medical): No  Physical Activity: Not on file  Stress: Not on file  Social Connections: Not on file  Intimate Partner Violence: Not At Risk (08/28/2022)   Humiliation, Afraid, Rape, and Kick questionnaire    Fear of Current or Ex-Partner: No    Emotionally Abused: No    Physically Abused: No    Sexually Abused: No    Family History:   History reviewed. No pertinent family history.   ROS:  Please see the history of present illness.   All other ROS reviewed and negative.     Physical Exam/Data:   Vitals:   08/29/22 0500 08/29/22 0600 08/29/22 0700 08/29/22 0800  BP: 115/73 126/71 (!) 141/77 (!) 148/105  Pulse: 99     Resp: (!) 21 (!) '22 20 20  '$ Temp: 98 F (36.7 C)     TempSrc: Oral     SpO2: 97%     Weight: 103.9 kg     Height:        Intake/Output Summary (Last 24 hours) at 08/29/2022 0956 Last data filed at 08/29/2022 0800 Gross per 24 hour  Intake 352.97 ml  Output 1200 ml  Net  -847.03 ml      08/29/2022    5:00 AM 08/28/2022    4:50 PM 08/26/2022    5:25 PM  Last 3 Weights  Weight (lbs) 229 lb 0.9 oz 228 lb 9.9 oz 229 lb  Weight (kg) 103.9 kg 103.7 kg 103.874 kg     Body mass index is 32.87 kg/m.  General:  Elderly male, sitting upright in the bed. No acute distress.  HEENT: normal Neck: no JVD with head of bed at 30 degrees  Vascular: Radial pulses 2+ bilaterally Cardiac:  normal S1, S2; irregular rate and rhythm  Lungs: Normal WOB on room air. Some crackles in lung bases. Faint expiratory wheezing  Abd: soft, nontender, not distended  Ext: no edema in ble  Musculoskeletal:  No deformities, BUE and BLE strength normal and equal Skin: warm and dry  Neuro:  CNs 2-12 intact, no focal abnormalities noted Psych:  Normal affect   EKG:  The EKG was personally reviewed and demonstrates:  EKG from 1/4 at 1054 showed sinus tachycardia, HR 139 BPM  Telemetry:  Telemetry was personally reviewed and demonstrates:  Atrial fibrillation, HR in the 110s-140s   Relevant CV Studies:  Echocardiogram 1/4  1. Left ventricular ejection fraction, by estimation, is 55 to 60%. There  is mild concentric left ventricular hypertrophy.   2. Right ventricular systolic function is normal. The right ventricular  size is normal.   3. The mitral valve is normal in structure. No evidence of mitral valve  regurgitation.   4. The aortic valve was not well visualized. Aortic valve regurgitation  is not visualized.   5. The inferior vena cava is normal in size with greater than 50%  respiratory variability, suggesting right atrial pressure of 3 mmHg   Laboratory Data:  High Sensitivity Troponin:   Recent Labs  Lab 08/26/22 1809  TROPONINIHS 15     Chemistry Recent Labs  Lab 08/27/22 0420 08/28/22 0258 08/29/22 0505  NA 134* 137 137  K 4.1 4.5 4.1  CL 104 108 107  CO2 22 21* 21*  GLUCOSE 105* 114* 119*  BUN 19 18 26*  CREATININE 1.75* 1.65* 1.68*  CALCIUM 7.8* 8.6* 8.3*   MG 2.0 1.9 1.9  GFRNONAA 40* 43* 42*  ANIONGAP '8 8 9    '$ No results for input(s): "PROT", "ALBUMIN", "AST", "ALT", "ALKPHOS", "BILITOT" in the last 168 hours. Lipids No results for input(s): "CHOL", "TRIG", "HDL", "LABVLDL", "LDLCALC", "CHOLHDL" in the last 168 hours.  Hematology Recent Labs  Lab 08/27/22 0420 08/28/22 0258 08/29/22 0505  WBC 9.2 7.6 7.3  RBC 4.05* 4.39 4.34  HGB 12.2* 13.1 13.0  HCT 37.0* 40.4 40.0  MCV 91.4 92.0 92.2  MCH 30.1 29.8 30.0  MCHC 33.0 32.4 32.5  RDW 13.8 13.6 13.6  PLT 231 291 362   Thyroid  Recent Labs  Lab 08/28/22 1324  TSH 2.413    BNPNo results for input(s): "BNP", "PROBNP" in the last 168 hours.  DDimer No results for input(s): "DDIMER" in the last 168 hours.   Radiology/Studies:  ECHOCARDIOGRAM LIMITED  Result Date: 08/28/2022    ECHOCARDIOGRAM LIMITED REPORT   Patient Name:   MAINOR HELLMANN Date of Exam: 08/28/2022 Medical Rec #:  [id number]      Height:       70.0 in Accession #:    [id number]     Weight:       229.0 lb Date of Birth:  [birth year]       BSA:          2.211 m Patient Age:    72 years       BP:           130/90 mmHg Patient Gender: M              HR:           125 bpm. Exam Location:  Inpatient Procedure: Limited Echo, Cardiac Doppler  and Color Doppler Indications:    Antonieta Pert, MD  History:        Patient has prior history of Echocardiogram examinations, most                 recent 04/09/2022. CAD; Risk Factors:Hypertension and Sleep                 Apnea. Pulmonary embolism and DVT in August.  Sonographer:    [facility] Referring Phys: [id number] Malinta D Kingman  1. Left ventricular ejection fraction, by estimation, is 55 to 60%. There is mild concentric left ventricular hypertrophy.  2. Right ventricular systolic function is normal. The right ventricular size is normal.  3. The mitral valve is normal in structure. No evidence of mitral valve regurgitation.  4. The aortic valve was not well visualized. Aortic  valve regurgitation is not visualized.  5. The inferior vena cava is normal in size with greater than 50% respiratory variability, suggesting right atrial pressure of 3 mmHg. FINDINGS  Left Ventricle: Left ventricular ejection fraction, by estimation, is 55 to 60%. The left ventricular internal cavity size was normal in size. There is mild concentric left ventricular hypertrophy. Right Ventricle: The right ventricular size is normal. Right ventricular systolic function is normal. Left Atrium: Left atrial size was normal in size. Right Atrium: Right atrial size was normal in size. Pericardium: There is no evidence of pericardial effusion. Presence of epicardial fat layer. Mitral Valve: The mitral valve is normal in structure. Aortic Valve: The aortic valve was not well visualized. Aortic valve regurgitation is not visualized. Venous: The inferior vena cava is normal in size with greater than 50% respiratory variability, suggesting right atrial pressure of 3 mmHg. Additional Comments: Spectral Doppler performed. Color Doppler performed.  LEFT VENTRICLE PLAX 2D LVIDd:         4.40 cm LVIDs:         3.60 cm LV PW:         1.25 cm LV IVS:        1.25 cm LVOT diam:     2.20 cm LV SV:         48 LV SV Index:   21 LVOT Area:     3.80 cm  LEFT ATRIUM             Index        RIGHT ATRIUM           Index LA Vol (A2C):   41.0 ml 18.54 ml/m  RA Area:     12.60 cm LA Vol (A4C):   40.6 ml 18.36 ml/m  RA Volume:   25.20 ml  11.40 ml/m LA Biplane Vol: 41.4 ml 18.72 ml/m  AORTIC VALVE LVOT Vmax:   82.90 cm/s LVOT Vmean:  57.700 cm/s LVOT VTI:    0.125 m  SHUNTS Systemic VTI:  0.12 m Systemic Diam: 2.20 cm Oswaldo Milian MD Electronically signed by Oswaldo Milian MD Signature Date/Time: 08/28/2022/11:08:12 AM    Final    DG Chest Portable 1 View  Result Date: 08/26/2022 CLINICAL DATA:  Dyspnea EXAM: PORTABLE CHEST 1 VIEW COMPARISON:  12/30/2021 FINDINGS: Patchy airspace disease at the left greater than right lung  base. Possible small left effusion. Stable cardiomediastinal silhouette IMPRESSION: Patchy airspace disease at the left greater than right lung base, suspect for pneumonia. Possible small left effusion. Radiographic follow-up to resolution is recommended. Electronically Signed   By: Donavan Foil M.D.   On: 08/26/2022 18:46  Assessment and Plan:   Atrial Fibrillation, New onset  - Patient presented after being told he was in atrial flutter in an urgent care. Also found to have influenza A, pneumonia.  - Telemetry overnight appears to be atrial fibrillation, HR in the 100s-120s. Rate and rhythm are irregular, no obvious flutter waves visible  - Patient is aware of a fluttering in his chest. Has had similar symptoms before, but is unable to recall when symptoms began/how often they have occurred  - Echocardiogram this admission showed EF 55-60%  - Patient already on Xarelto for history of PE/DVT. Continue - Patient on IV diltiazem, BP tolerating well  - Add metoprolol 25 mg BID  - Suspect afib is driven by pneumonia, flu. Also has history of OSA, reports good compliance with CPAP. TSH within normal limits  - Patient cannot be sure that he has taken every dose of xarelto for the past month, and may have missed a dose or two. Cannot do cardioversion without TEE first. If HR improves with metoprolol, likely can consider outpatient cardioversion after 3 weeks of uninterrupted AC, and when he has gotten over influenza/pneumonia   Otherwise per primary  - Influenza A - Pneumonia  - AKI  - History of DVT/PE   Risk Assessment/Risk Scores:    CHA2DS2-VASc Score = 2  This indicates a 2.2% annual risk of stroke. The patient's score is based upon: CHF History: 0 HTN History: 1 Diabetes History: 0 Stroke History: 0 Vascular Disease History: 0 Age Score: 1 Gender Score: 0    For questions or updates, please contact [place] Please consult [email or web address] for contact info under     Signed, Margie Billet, PA-C  08/29/2022 9:56 AM  Personally seen and examined. Agree with above.  77 year old here with flu and pneumonia.  Was found to be an atrial fibrillation of unknown duration.  He may have occasionally missed a dose of Xarelto.  He has been on longstanding Xarelto for history of DVT and PE.  He also underwent a right nephrectomy for mass which turned out to be benign thankfully.  Currently still with cough.  Heart irregularly irregular, no significant murmurs occasional shortness of breath.  Seems to be improving however from flu standpoint.  Echocardiogram showed EF of 60%.  Assessment and plan  Atrial fibrillation persistent new onset - Telemetry has improved heart rate now in the 70s.  PVCs noted as well.  I think the addition of the metoprolol 25 mg twice a day has helped with IV diltiazem 10 mg/h for now.  Tomorrow transition to p.o. diltiazem.  I do not envision that he will need an urgent cardioversion.  If he is still in atrial fibrillation after 3 weeks of consistent Xarelto, recommend outpatient cardioversion to help restore normal rhythm.  Chronic anticoagulation - Continue with Xarelto.  He was on this previously for prior PE DVT.  We will follow along.  Candee Furbish, MD

## 2022-08-29 NOTE — Progress Notes (Signed)
PROGRESS NOTE    Stanley Juarez  JGG:[id number] DOB: 1945-12-31 DOA: 08/26/2022 PCP: Mayra Neer, MD   Brief Narrative:    Stanley Juarez is a pleasant 77 y.o. male with medical history significant for CAD hypertension, OSA, history of DVT and PE on Xarelto, and right renal oncocytoma status post nephrectomy who presents to the emergency department with cough, congestion, and shortness of breath.  Patient was admitted with sepsis secondary to pneumonia superimposed on influenza A.  He is also noted to have AKI.  He additionally has atrial flutter/fib which would be a new diagnosis likely precipitated by acute respiratory illness.  He will be started on Cardizem drip 1/4 and TSH WNL and 2D echocardiogram limited with preserved LVEF.  Cardiology consulted for assistance in management with recommendations to add metoprolol twice daily on 1/5.  Assessment & Plan:   Principal Problem:   Sepsis due to pneumonia [facility]) Active Problems:   History of pulmonary embolism   Influenza A with pneumonia   Sleep apnea   AKI (acute kidney injury) ([place])   Hypertension   Atrial flutter by electrocardiogram ([place])  Assessment and Plan:  1. Sepsis secondary to pneumonia; influenza A  - Influenza A positive; CXR concerning for pneumonia and he has productive cough and leukocytosis  - Started Tamiflu, culture blood with no growth to date, procalcitonin low, strep pneumo antigen negative -Levaquin day 4/5, plan to discontinue after tomorrow with stop date   2. AKI-improving - SCr is 1.83 on admission; was 1.89 on Sept 16th but 1.18 on Sept 5th and this is suspected to be acute  - Hold lisinopril, check UA with microscopy, continue IVF hydration, renally-dose medications, repeat chem panel in am   -Wife gave patient his lisinopril from home medication on 1/3   3. Atrial fibrillation/new onset - Appears to be in rapid atrial flutter in ED which would be a new diagnosis; rate has been in 120-130s with  stable BP   -EKG with noted sinus tachycardia - Likely precipitated by the acute respiratory illness  - Treat underlying respiratory illness, continue Xarelto for CVA prevention(EF was preserved on TTE in August 2022)  -Initially appeared to be sinus tachycardia, but has had periods of atrial fibrillation overnight.  Could be atrial flutter with 2:1 conduction as well, but rate appears to be is too slow for that.  Started on Cardizem drip 1/4.  Appreciate cardiology evaluation. -Limited 2D echocardiogram with preserved LVEF and TSH within normal limits -Continue Cardizem drip and does not appear to be a candidate for cardioversion at this time, started on metoprolol 25 mg twice daily per cardiology   4. Hypertension  -Hold home amlodipine and Cardizem drip initiated, monitor carefully -Labetalol ordered for blood pressure control -Now on metoprolol 25 mg twice daily to assist with rate control -Home lisinopril being held   5. 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 OSA  - CPAP qHS  -Resumed home trazodone at bedtime   7. Obesity -BMI 32.86       DVT prophylaxis: Xarelto Code Status: Full Family Communication: Wife at bedside 1/4 Disposition Plan:  Status is: Inpatient Remains inpatient appropriate because: Need for IV medications.     Consultants:  Cardiology   Procedures:  None  Antimicrobials:  Anti-infectives (From admission, onward)    Start     Dose/Rate Route Frequency Ordered Stop   08/27/22 1000  oseltamivir (TAMIFLU) capsule 30 mg        30 mg Oral 2 times daily 08/26/22 1948 08/31/22 2159   08/26/22 2100  levofloxacin (LEVAQUIN) IVPB 750 mg        750 mg 100 mL/hr over 90 Minutes Intravenous Every 48 hours 08/26/22 1944 08/30/22 2359   08/26/22 2000  oseltamivir (TAMIFLU) capsule 75 mg        75 mg Oral  Once 08/26/22 1948 08/26/22 2109       Subjective: Patient seen and evaluated  today with no new acute complaints or concerns. No acute concerns or events noted overnight.  He still has some difficulty sleeping overnight due to elevated heart rate.  Objective: Vitals:   08/29/22 0500 08/29/22 0600 08/29/22 0700 08/29/22 0800  BP: 115/73 126/71 (!) 141/77 (!) 148/105  Pulse: 99     Resp: (!) 21 (!) '22 20 20  '$ Temp: 98 F (36.7 C)     TempSrc: Oral     SpO2: 97%     Weight: 103.9 kg     Height:        Intake/Output Summary (Last 24 hours) at 08/29/2022 1041 Last data filed at 08/29/2022 0800 Gross per 24 hour  Intake 352.97 ml  Output 1200 ml  Net -847.03 ml   Filed Weights   08/26/22 1725 08/28/22 1650 08/29/22 0500  Weight: 103.9 kg 103.7 kg 103.9 kg    Examination:  General exam: Appears calm and comfortable  Respiratory system: Clear to auscultation. Respiratory effort normal. Cardiovascular system: S1 & S2 heard, irregular and tachycardic Gastrointestinal system: Abdomen is soft Central nervous system: Alert and awake Extremities: No edema Skin: No significant lesions noted Psychiatry: Flat affect.    Data Reviewed: I have personally reviewed following labs and imaging studies  CBC: Recent Labs  Lab 08/26/22 1750 08/27/22 0420 08/28/22 0258 08/29/22 0505  WBC 12.0* 9.2 7.6 7.3  HGB 14.4 12.2* 13.1 13.0  HCT 44.0 37.0* 40.4 40.0  MCV 91.9 91.4 92.0 92.2  PLT 257 231 291 122   Basic Metabolic Panel: Recent Labs  Lab 08/26/22 1750 08/27/22 0420 08/28/22 0258 08/29/22 0505  NA 134* 134* 137 137  K 5.0 4.1 4.5 4.1  CL 102 104 108 107  CO2 21* 22 21* 21*  GLUCOSE 159* 105* 114* 119*  BUN 25* 19 18 26*  CREATININE 1.83* 1.75* 1.65* 1.68*  CALCIUM 8.5* 7.8* 8.6* 8.3*  MG  --  2.0 1.9 1.9   GFR: Estimated Creatinine Clearance: 46.6 mL/min (A) (by C-G formula based on SCr of 1.68 mg/dL (H)). Liver Function Tests: No results for input(s): "AST", "ALT", "ALKPHOS", "BILITOT", "PROT", "ALBUMIN" in the last 168 hours. No results for  input(s): "LIPASE", "AMYLASE" in the last 168 hours. No results for input(s): "AMMONIA" in the last 168 hours. Coagulation Profile: No results for input(s): "INR", "PROTIME" in the last 168 hours. Cardiac Enzymes: No results for input(s): "CKTOTAL", "CKMB", "CKMBINDEX", "TROPONINI" in the last 168 hours. BNP (last 3 results) No results for input(s): "PROBNP" in the last 8760 hours. HbA1C: No results for input(s): "HGBA1C" in the last 72 hours. CBG: No results for input(s): "GLUCAP" in the last 168 hours. Lipid Profile: No results for input(s): "CHOL", "HDL", "LDLCALC", "TRIG", "CHOLHDL", "LDLDIRECT" in the last 72 hours. Thyroid Function Tests: Recent Labs    08/28/22 1324  TSH 2.413   Anemia Panel: No results for input(s): "VITAMINB12", "FOLATE", "FERRITIN", "TIBC", "IRON", "RETICCTPCT" in the last 72 hours. Sepsis Labs: Recent Labs  Lab 08/26/22 1800 08/26/22 1922 08/27/22 0420 08/28/22 0258  PROCALCITON 0.27  --  0.16 0.11  LATICACIDVEN  --  0.8  --   --     Recent Results (from the past 240 hour(s))  Resp panel by RT-PCR (RSV, Flu A&B, Covid) Anterior Nasal Swab     Status: Abnormal   Collection Time: 08/26/22  6:19 PM   Specimen: Anterior Nasal Swab  Result Value Ref Range Status   SARS Coronavirus 2 by RT PCR NEGATIVE NEGATIVE Final    Comment: (NOTE) SARS-CoV-2 target nucleic acids are NOT DETECTED.  The SARS-CoV-2 RNA is generally detectable in upper respiratory specimens during the acute phase of infection. The lowest concentration of SARS-CoV-2 viral copies this assay can detect is 138 copies/mL. A negative result does not preclude SARS-Cov-2 infection and should not be used as the sole basis for treatment or other patient management decisions. A negative result may occur with  improper specimen collection/handling, submission of specimen other than nasopharyngeal swab, presence of viral mutation(s) within the areas targeted by this assay, and inadequate  number of viral copies(<138 copies/mL). A negative result must be combined with clinical observations, patient history, and epidemiological information. The expected result is Negative.  Fact Sheet for Patients:  EntrepreneurPulse.com.au  Fact Sheet for Healthcare Providers:  IncredibleEmployment.be  This test is no t yet approved or cleared by the [place] FDA and  has been authorized for detection and/or diagnosis of SARS-CoV-2 by FDA under an Emergency Use Authorization (EUA). This EUA will remain  in effect (meaning this test can be used) for the duration of the COVID-19 declaration under Section 564(b)(1) of the Act, 21 U.S.C.section 360bbb-3(b)(1), unless the authorization is terminated  or revoked sooner.       Influenza A by PCR POSITIVE (A) NEGATIVE Final   Influenza B by PCR NEGATIVE NEGATIVE Final    Comment: (NOTE) The Xpert Xpress SARS-CoV-2/FLU/RSV plus assay is intended as an aid in the diagnosis of influenza from Nasopharyngeal swab specimens and should not be used as a sole basis for treatment. Nasal washings and aspirates are unacceptable for Xpert Xpress SARS-CoV-2/FLU/RSV testing.  Fact Sheet for Patients: EntrepreneurPulse.com.au  Fact Sheet for Healthcare Providers: IncredibleEmployment.be  This test is not yet approved or cleared by the [place] FDA and has been authorized for detection and/or diagnosis of SARS-CoV-2 by FDA under an Emergency Use Authorization (EUA). This EUA will remain in effect (meaning this test can be used) for the duration of the COVID-19 declaration under Section 564(b)(1) of the Act, 21 U.S.C. section 360bbb-3(b)(1), unless the authorization is terminated or revoked.     Resp Syncytial Virus by PCR NEGATIVE NEGATIVE Final    Comment: (NOTE) Fact Sheet for Patients: EntrepreneurPulse.com.au  Fact Sheet for Healthcare  Providers: IncredibleEmployment.be  This test is not yet approved or cleared by the [place] FDA and has been authorized for detection and/or diagnosis of SARS-CoV-2 by FDA under an Emergency Use Authorization (EUA). This EUA will remain in effect (meaning this test can be used) for the duration of the COVID-19 declaration under Section 564(b)(1) of the Act, 21 U.S.C. section 360bbb-3(b)(1), unless the authorization is terminated or revoked.  Performed at [facility], [place] [address] [place], [place] [address]   Culture, blood (routine x 2) Call MD if unable to obtain prior to antibiotics being given     Status: None (Preliminary result)   Collection Time: 08/26/22 10:14 PM   Specimen: BLOOD LEFT HAND  Result Value Ref Range Status   Specimen Description   Final    BLOOD LEFT HAND Performed at [place] [address], [place], [place] [address]    Special Requests   Final    BOTTLES DRAWN AEROBIC AND ANAEROBIC Blood Culture adequate volume Performed at [place] [address], [place], [place] [address]    Culture   Final    NO GROWTH 1 DAY Performed at [facility], [place] [address] [place], [place] [address]    Report Status PENDING  Incomplete  Culture, blood (routine x 2) Call MD if unable to obtain prior to antibiotics being given     Status: None (Preliminary result)   Collection Time: 08/26/22 10:16 PM   Specimen: BLOOD  Result Value Ref Range Status   Specimen Description   Final    BLOOD RIGHT ANTECUBITAL Performed at [facility], [place] [address] [place], [place] [address]    Special Requests   Final    BOTTLES DRAWN AEROBIC AND ANAEROBIC Blood Culture adequate volume Performed at [place] [address], [place], [place] [address]    Culture   Final    NO GROWTH 1 DAY Performed at [facility], [place] [address], [place], [place]  [address]    Report Status PENDING  Incomplete         Radiology Studies: ECHOCARDIOGRAM LIMITED  Result Date: 08/28/2022    ECHOCARDIOGRAM LIMITED REPORT   Patient Name:   Stanley Juarez Date of Exam: 08/28/2022 Medical Rec #:  [id number]      Height:       70.0 in Accession #:    [id number]     Weight:       229.0 lb Date of Birth:  06-26-1946       BSA:          2.211 m Patient Age:    27 years       BP:           130/90 mmHg Patient Gender: M              HR:           125 bpm. Exam Location:  Inpatient Procedure: Limited Echo, Cardiac Doppler and Color Doppler Indications:    Antonieta Pert, MD  History:        Patient has prior history of Echocardiogram examinations, most                 recent 04/09/2022. CAD; Risk Factors:Hypertension and Sleep                 Apnea. Pulmonary embolism and DVT in August.  Sonographer:    [place] Referring Phys: [id number] [place]  1. Left ventricular ejection fraction, by estimation, is 55 to 60%. There is mild concentric left ventricular hypertrophy.  2. Right ventricular systolic function is normal. The right ventricular size is normal.  3. The mitral valve is normal in structure. No evidence of mitral valve regurgitation.  4. The aortic valve was not well visualized. Aortic valve regurgitation is not visualized.  5. The inferior vena cava is normal in size with greater than 50% respiratory variability, suggesting right atrial pressure of 3 mmHg. FINDINGS  Left Ventricle: Left ventricular ejection fraction, by estimation, is 55 to 60%. The left ventricular internal cavity size was normal in size. There is mild concentric left ventricular  hypertrophy. Right Ventricle: The right ventricular size is normal. Right ventricular systolic function is normal. Left Atrium: Left atrial size was normal in size. Right Atrium: Right atrial size was normal in size. Pericardium: There is no evidence of pericardial effusion. Presence of epicardial fat layer. Mitral  Valve: The mitral valve is normal in structure. Aortic Valve: The aortic valve was not well visualized. Aortic valve regurgitation is not visualized. Venous: The inferior vena cava is normal in size with greater than 50% respiratory variability, suggesting right atrial pressure of 3 mmHg. Additional Comments: Spectral Doppler performed. Color Doppler performed.  LEFT VENTRICLE PLAX 2D LVIDd:         4.40 cm LVIDs:         3.60 cm LV PW:         1.25 cm LV IVS:        1.25 cm LVOT diam:     2.20 cm LV SV:         48 LV SV Index:   21 LVOT Area:     3.80 cm  LEFT ATRIUM             Index        RIGHT ATRIUM           Index LA Vol (A2C):   41.0 ml 18.54 ml/m  RA Area:     12.60 cm LA Vol (A4C):   40.6 ml 18.36 ml/m  RA Volume:   25.20 ml  11.40 ml/m LA Biplane Vol: 41.4 ml 18.72 ml/m  AORTIC VALVE LVOT Vmax:   82.90 cm/s LVOT Vmean:  57.700 cm/s LVOT VTI:    0.125 m  SHUNTS Systemic VTI:  0.12 m Systemic Diam: 2.20 cm Oswaldo Milian MD Electronically signed by Oswaldo Milian MD Signature Date/Time: 08/28/2022/11:08:12 AM    Final         Scheduled Meds:  fluticasone  1 spray Each Nare Daily   metoprolol tartrate  25 mg Oral BID   oseltamivir  30 mg Oral BID   rivaroxaban  20 mg Oral Q supper   sodium chloride flush  3 mL Intravenous Q12H   Continuous Infusions:  diltiazem (CARDIZEM) infusion 10 mg/hr (08/29/22 0110)   levofloxacin (LEVAQUIN) IV 750 mg (08/28/22 2131)     LOS: 3 days    Time spent: 35 minutes    Sommer Spickard Darleen Crocker, DO [facility]  If 7PM-7AM, please contact night-coverage [email or web address] 08/29/2022, 10:41 AM

## 2022-08-29 NOTE — Progress Notes (Signed)
Pharmacy Antibiotic Note  Stanley Juarez is a 77 y.o. male admitted on 08/26/2022 with pneumonia.  Pharmacy has been consulted for levofloxacin dosing.  He is also being treated with Tamiflu for Influenza A.    Plan: Continue Levofloxacin 750 mg IV q48h for CrCl 20-50 mL/min Monitor renal function, duration of therapy,   Height: '5\' 10"'$  (177.8 cm) Weight: 103.9 kg (229 lb 0.9 oz) IBW/kg (Calculated) : 73  Temp (24hrs), Avg:97.8 F (36.6 C), Min:97.5 F (36.4 C), Max:98 F (36.7 C)  Recent Labs  Lab 08/26/22 1750 08/26/22 1922 08/27/22 0420 08/28/22 0258 08/29/22 0505  WBC 12.0*  --  9.2 7.6 7.3  CREATININE 1.83*  --  1.75* 1.65* 1.68*  LATICACIDVEN  --  0.8  --   --   --      Estimated Creatinine Clearance: 46.6 mL/min (A) (by C-G formula based on SCr of 1.68 mg/dL (H)).    Allergies  Allergen Reactions   Prednisone     Constipation   Penicillins Swelling and Rash    Did it involve swelling of the face/tongue/throat, SOB, or low BP? Yes Did it involve sudden or severe rash/hives, skin peeling, or any reaction on the inside of your mouth or nose? Yes Did you need to seek medical attention at a hospital or doctor's office? No When did it last happen?    childhood   If all above answers are "NO", may proceed with cephalosporin use.      Antimicrobials this admission:  1/2 LVQ >>  1/2 Oseltamivir >> (1/7)  Gretta Arab PharmD, BCPS [facility] main pharmacy [phone] 08/29/2022 8:41 AM

## 2022-08-30 DIAGNOSIS — J189 Pneumonia, unspecified organism: Secondary | ICD-10-CM | POA: Diagnosis not present

## 2022-08-30 DIAGNOSIS — I4819 Other persistent atrial fibrillation: Secondary | ICD-10-CM

## 2022-08-30 DIAGNOSIS — A419 Sepsis, unspecified organism: Secondary | ICD-10-CM | POA: Diagnosis not present

## 2022-08-30 MED ORDER — GUAIFENESIN-DM 100-10 MG/5ML PO SYRP: 5.0000 mL | ORAL_SOLUTION | ORAL | 0 refills | Status: DC | PRN

## 2022-08-30 MED ORDER — DILTIAZEM HCL ER COATED BEADS 240 MG PO CP24
240.0000 mg | ORAL_CAPSULE | Freq: Every day | ORAL | Status: DC
  Administered 2022-08-30: 240 mg via ORAL
  Filled 2022-08-30: qty 1

## 2022-08-30 MED ORDER — METOPROLOL TARTRATE 25 MG PO TABS: 25.0000 mg | ORAL_TABLET | Freq: Two times a day (BID) | ORAL | 1 refills | Status: DC

## 2022-08-30 MED ORDER — DILTIAZEM HCL ER COATED BEADS 240 MG PO CP24: 240.0000 mg | ORAL_CAPSULE | Freq: Every day | ORAL | 1 refills | Status: DC

## 2022-08-30 NOTE — Progress Notes (Signed)
Rounding Note    Patient Name: Stanley Juarez Date of Encounter: 08/30/2022  [facility] Cardiologist: Kirk Ruths, MD    Subjective    77 yo with hx of OSA, DVT/ PE admitted with influenza A and incidental finding of atrial fib / flutter   Still in atrial fib HR is well controlled On dilt drip , metoprolol Will convert the dilt drip to PO    Inpatient Medications    Scheduled Meds:  fluticasone  1 spray Each Nare Daily   metoprolol tartrate  25 mg Oral BID   oseltamivir  30 mg Oral BID   rivaroxaban  20 mg Oral Q supper   sodium chloride flush  3 mL Intravenous Q12H   Continuous Infusions:  diltiazem (CARDIZEM) infusion 10 mg/hr (08/30/22 0236)   levofloxacin (LEVAQUIN) IV 750 mg (08/28/22 2131)   PRN Meds: acetaminophen **OR** acetaminophen, guaiFENesin-dextromethorphan, labetalol, ondansetron **OR** ondansetron (ZOFRAN) IV, oxyCODONE, senna-docusate, sodium chloride, traZODone   Vital Signs    Vitals:   08/30/22 0500 08/30/22 0600 08/30/22 0700 08/30/22 0800  BP: 112/79 121/73 130/82 127/79  Pulse:      Resp: 19 20    Temp:      TempSrc:      SpO2:      Weight:      Height:        Intake/Output Summary (Last 24 hours) at 08/30/2022 0927 Last data filed at 08/30/2022 0200 Gross per 24 hour  Intake 488.37 ml  Output 300 ml  Net 188.37 ml      08/30/2022    4:52 AM 08/29/2022    5:00 AM 08/28/2022    4:50 PM  Last 3 Weights  Weight (lbs) 224 lb 6.9 oz 229 lb 0.9 oz 228 lb 9.9 oz  Weight (kg) 101.8 kg 103.9 kg 103.7 kg      Telemetry    Atrial fib with controlled V response - Personally Reviewed  ECG     - Personally Reviewed  Physical Exam   GEN: moderately obese male,  No acute distress.   Neck: No JVD Cardiac: RRR, no murmurs, rubs, or gallops.  Respiratory: bilateral rales  GI: obese  MS: No edema; No deformity. Neuro:  Nonfocal  Psych: Normal affect   Labs    High Sensitivity Troponin:   Recent Labs  Lab 08/26/22 1809   TROPONINIHS 15     Chemistry Recent Labs  Lab 08/27/22 0420 08/28/22 0258 08/29/22 0505  NA 134* 137 137  K 4.1 4.5 4.1  CL 104 108 107  CO2 22 21* 21*  GLUCOSE 105* 114* 119*  BUN 19 18 26*  CREATININE 1.75* 1.65* 1.68*  CALCIUM 7.8* 8.6* 8.3*  MG 2.0 1.9 1.9  GFRNONAA 40* 43* 42*  ANIONGAP '8 8 9    '$ Lipids No results for input(s): "CHOL", "TRIG", "HDL", "LABVLDL", "LDLCALC", "CHOLHDL" in the last 168 hours.  Hematology Recent Labs  Lab 08/27/22 0420 08/28/22 0258 08/29/22 0505  WBC 9.2 7.6 7.3  RBC 4.05* 4.39 4.34  HGB 12.2* 13.1 13.0  HCT 37.0* 40.4 40.0  MCV 91.4 92.0 92.2  MCH 30.1 29.8 30.0  MCHC 33.0 32.4 32.5  RDW 13.8 13.6 13.6  PLT 231 291 362   Thyroid  Recent Labs  Lab 08/28/22 1324  TSH 2.413    BNPNo results for input(s): "BNP", "PROBNP" in the last 168 hours.  DDimer No results for input(s): "DDIMER" in the last 168 hours.   Radiology  ECHOCARDIOGRAM LIMITED  Result Date: 08/28/2022    ECHOCARDIOGRAM LIMITED REPORT   Patient Name:   Stanley Juarez Date of Exam: 08/28/2022 Medical Rec #:  [id number]      Height:       70.0 in Accession #:    [id number]     Weight:       229.0 lb Date of Birth:  04-24-1946       BSA:          2.211 m Patient Age:    76 years       BP:           130/90 mmHg Patient Gender: M              HR:           125 bpm. Exam Location:  Inpatient Procedure: Limited Echo, Cardiac Doppler and Color Doppler Indications:    Antonieta Pert, MD  History:        Patient has prior history of Echocardiogram examinations, most                 recent 04/09/2022. CAD; Risk Factors:Hypertension and Sleep                 Apnea. Pulmonary embolism and DVT in August.  Sonographer:    [place] Referring Phys: [id number] [place]  1. Left ventricular ejection fraction, by estimation, is 55 to 60%. There is mild concentric left ventricular hypertrophy.  2. Right ventricular systolic function is normal. The right ventricular size is  normal.  3. The mitral valve is normal in structure. No evidence of mitral valve regurgitation.  4. The aortic valve was not well visualized. Aortic valve regurgitation is not visualized.  5. The inferior vena cava is normal in size with greater than 50% respiratory variability, suggesting right atrial pressure of 3 mmHg. FINDINGS  Left Ventricle: Left ventricular ejection fraction, by estimation, is 55 to 60%. The left ventricular internal cavity size was normal in size. There is mild concentric left ventricular hypertrophy. Right Ventricle: The right ventricular size is normal. Right ventricular systolic function is normal. Left Atrium: Left atrial size was normal in size. Right Atrium: Right atrial size was normal in size. Pericardium: There is no evidence of pericardial effusion. Presence of epicardial fat layer. Mitral Valve: The mitral valve is normal in structure. Aortic Valve: The aortic valve was not well visualized. Aortic valve regurgitation is not visualized. Venous: The inferior vena cava is normal in size with greater than 50% respiratory variability, suggesting right atrial pressure of 3 mmHg. Additional Comments: Spectral Doppler performed. Color Doppler performed.  LEFT VENTRICLE PLAX 2D LVIDd:         4.40 cm LVIDs:         3.60 cm LV PW:         1.25 cm LV IVS:        1.25 cm LVOT diam:     2.20 cm LV SV:         48 LV SV Index:   21 LVOT Area:     3.80 cm  LEFT ATRIUM             Index        RIGHT ATRIUM           Index LA Vol (A2C):   41.0 ml 18.54 ml/m  RA Area:     12.60 cm LA Vol (A4C):   40.6 ml 18.36  ml/m  RA Volume:   25.20 ml  11.40 ml/m LA Biplane Vol: 41.4 ml 18.72 ml/m  AORTIC VALVE LVOT Vmax:   82.90 cm/s LVOT Vmean:  57.700 cm/s LVOT VTI:    0.125 m  SHUNTS Systemic VTI:  0.12 m Systemic Diam: 2.20 cm Oswaldo Milian MD Electronically signed by Oswaldo Milian MD Signature Date/Time: 08/28/2022/11:08:12 AM    Final     Cardiac Studies      Patient Profile      77 y.o. male  with hx of DVT/ PE , with influenza A and incidental dx of Afib   Assessment & Plan     1.  Atrial fib:  HR is well controlled.  Will change the IV dilt to PO .  Cont metoprolol  Cont xarelto   If his HR and BP remain stable, he is stable for DC   2.   OSA   3.  Hx of unprovoked PE:  on xarelto ,           For questions or updates, please contact [place] Please consult [email or web address] for contact info under        Signed, Mertie Moores, MD  08/30/2022, 9:27 AM

## 2022-08-30 NOTE — Progress Notes (Signed)
SATURATION QUALIFICATIONS: (This note is used to comply with regulatory documentation for home oxygen)  Patient Saturations on Room Air at Rest = 98%  Patient Saturations on Room Air while Ambulating = 97%  Patient Saturations on 0 Liters of oxygen while Ambulating = 97%  Please briefly explain why patient needs home oxygen:

## 2022-08-30 NOTE — Discharge Summary (Signed)
Physician Discharge Summary  Stanley Juarez AOZ:[id number] DOB: 1946/05/29 DOA: 08/26/2022  PCP: Stanley Neer, MD  Admit date: 08/26/2022  Discharge date: 08/30/2022  Admitted From:Home  Disposition:  Home  Recommendations for Outpatient Follow-up:  Follow up with PCP in 1-2 weeks, follow BMP due to AKI with discharge creatinine 1.68 and baseline near 1.2.  Continue to hold lisinopril. Follow-up with cardiology as scheduled in 3-4 weeks with Dr. Marlou Porch or APP Continue metoprolol 25 mg twice daily and Cardizem 240 mg CD daily as prescribed Continue Xarelto for anticoagulation as prior Continue other home medications as prior No further need for antibiotic or Tamiflu  Home Health: None  Equipment/Devices: None  Discharge Condition:Stable  CODE STATUS: Full  Diet recommendation: Heart Healthy  Brief/Interim Summary: Stanley Juarez is a pleasant 77 y.o. male with medical history significant for CAD hypertension, OSA, history of DVT and PE on Xarelto, and right renal oncocytoma status post nephrectomy who presents to the emergency department with cough, congestion, and shortness of breath.  Patient was admitted with sepsis, POA, secondary to pneumonia superimposed on influenza A.  He was started on treatment with Tamiflu as well as Levaquin and was also noted to have AKI which has improved with the use of IV fluid.  He was noted to have new onset atrial fibrillation with RVR in the setting of influenza as well as pneumonia and had required IV Cardizem drip for rate control.  He was seen by cardiology and underwent 2D echocardiogram evaluation with no significant findings and TSH was within normal limits.  He was initiated on metoprolol 25 mg twice daily and then subsequently taken off the Cardizem drip and converted to oral Cardizem 240 mg CD.  He is now rate controlled and has no further symptomatology and is in stable condition for discharge with outpatient recommendations as noted above.   He will follow-up with cardiology in 3-4 weeks with appointment arranged.  Discharge Diagnoses:  Principal Problem:   Sepsis due to pneumonia [facility]) Active Problems:   History of pulmonary embolism   Influenza A with pneumonia   Sleep apnea   AKI (acute kidney injury) ([place])   Hypertension   Atrial flutter by electrocardiogram [facility])  Principal discharge diagnosis: Sepsis, present on admission, secondary to community-acquired pneumonia superimposed on influenza A with associated AKI.  New onset atrial fibrillation with RVR.  Discharge Instructions  Discharge Instructions     Diet - low sodium heart healthy   Complete by: As directed    Increase activity slowly   Complete by: As directed       Allergies as of 08/30/2022       Reactions   Prednisone    Constipation   Penicillins Swelling, Rash   Did it involve swelling of the face/tongue/throat, SOB, or low BP? Yes Did it involve sudden or severe rash/hives, skin peeling, or any reaction on the inside of your mouth or nose? Yes Did you need to seek medical attention at a hospital or doctor's office? No When did it last happen?    childhood   If all above answers are "NO", may proceed with cephalosporin use.        Medication List     STOP taking these medications    amLODipine 5 MG tablet Commonly known as: NORVASC   lisinopril 10 MG tablet Commonly known as: ZESTRIL       TAKE these medications    allopurinol 300 MG tablet Commonly known as: ZYLOPRIM Take 150 mg  by mouth daily.   cyclobenzaprine 5 MG tablet Commonly known as: FLEXERIL Take 5 mg by mouth 3 (three) times daily as needed for muscle spasms.   diltiazem 240 MG 24 hr capsule Commonly known as: CARDIZEM CD Take 1 capsule (240 mg total) by mouth daily. Start taking on: August 31, 2022   docusate sodium 100 MG capsule Commonly known as: COLACE Take 1 capsule (100 mg total) by mouth 2 (two) times daily. What changed:  when to take  this reasons to take this   guaiFENesin-dextromethorphan 100-10 MG/5ML syrup Commonly known as: ROBITUSSIN DM Take 5 mLs by mouth every 4 (four) hours as needed for cough (chest congestion).   HYDROcodone-acetaminophen 5-325 MG tablet Commonly known as: Norco Take 1-2 tablets by mouth every 6 (six) hours as needed for moderate pain or severe pain.   Lubricant Eye Drops 0.4-0.3 % Soln Generic drug: Polyethyl Glycol-Propyl Glycol Place 1 drop into both eyes in the morning.   metoprolol tartrate 25 MG tablet Commonly known as: LOPRESSOR Take 1 tablet (25 mg total) by mouth 2 (two) times daily.   Nasal Moist 0.65 % nasal spray Generic drug: sodium chloride Place 1 spray into the nose at bedtime as needed (dryness due to CPAP).   promethazine 12.5 MG tablet Commonly known as: PHENERGAN Take 1 tablet (12.5 mg total) by mouth every 4 (four) hours as needed for nausea or vomiting.   rivaroxaban 20 MG Tabs tablet Commonly known as: XARELTO Take 1 tablet (20 mg total) by mouth daily with supper.   traZODone 100 MG tablet Commonly known as: DESYREL Take 100 mg by mouth at bedtime as needed for sleep.        Follow-up Information     Jerline Pain, MD Follow up.   Specialty: Cardiology Why: the office should call you on Monday or Tuesday for follow up appt.  call the office if you have not heard by Wednesday to make appt Contact information: [address] [place] [address] [phone]         Stanley Neer, MD. Schedule an appointment as soon as possible for a visit in 1 week(s).   Specialty: Family Medicine Contact information: [address] [place] [address] [phone]                Allergies  Allergen Reactions   Prednisone     Constipation   Penicillins Swelling and Rash    Did it involve swelling of the face/tongue/throat, SOB, or low BP? Yes Did it involve sudden or severe rash/hives, skin peeling, or  any reaction on the inside of your mouth or nose? Yes Did you need to seek medical attention at a hospital or doctor's office? No When did it last happen?    childhood   If all above answers are "NO", may proceed with cephalosporin use.      Consultations: Cardiology   Procedures/Studies: ECHOCARDIOGRAM LIMITED  Result Date: 08/28/2022    ECHOCARDIOGRAM LIMITED REPORT   Patient Name:   Stanley Juarez Date of Exam: 08/28/2022 Medical Rec #:  [id number]      Height:       70.0 in Accession #:    [id number]     Weight:       229.0 lb Date of Birth:  1946-06-26       BSA:          2.211 m Patient Age:    3 years  BP:           130/90 mmHg Patient Gender: M              HR:           125 bpm. Exam Location:  Inpatient Procedure: Limited Echo, Cardiac Doppler and Color Doppler Indications:    Antonieta Pert, MD  History:        Patient has prior history of Echocardiogram examinations, most                 recent 04/09/2022. CAD; Risk Factors:Hypertension and Sleep                 Apnea. Pulmonary embolism and DVT in August.  Sonographer:    [place] Referring Phys: [id number] Dalton D Bell  1. Left ventricular ejection fraction, by estimation, is 55 to 60%. There is mild concentric left ventricular hypertrophy.  2. Right ventricular systolic function is normal. The right ventricular size is normal.  3. The mitral valve is normal in structure. No evidence of mitral valve regurgitation.  4. The aortic valve was not well visualized. Aortic valve regurgitation is not visualized.  5. The inferior vena cava is normal in size with greater than 50% respiratory variability, suggesting right atrial pressure of 3 mmHg. FINDINGS  Left Ventricle: Left ventricular ejection fraction, by estimation, is 55 to 60%. The left ventricular internal cavity size was normal in size. There is mild concentric left ventricular hypertrophy. Right Ventricle: The right ventricular size is normal. Right ventricular  systolic function is normal. Left Atrium: Left atrial size was normal in size. Right Atrium: Right atrial size was normal in size. Pericardium: There is no evidence of pericardial effusion. Presence of epicardial fat layer. Mitral Valve: The mitral valve is normal in structure. Aortic Valve: The aortic valve was not well visualized. Aortic valve regurgitation is not visualized. Venous: The inferior vena cava is normal in size with greater than 50% respiratory variability, suggesting right atrial pressure of 3 mmHg. Additional Comments: Spectral Doppler performed. Color Doppler performed.  LEFT VENTRICLE PLAX 2D LVIDd:         4.40 cm LVIDs:         3.60 cm LV PW:         1.25 cm LV IVS:        1.25 cm LVOT diam:     2.20 cm LV SV:         48 LV SV Index:   21 LVOT Area:     3.80 cm  LEFT ATRIUM             Index        RIGHT ATRIUM           Index LA Vol (A2C):   41.0 ml 18.54 ml/m  RA Area:     12.60 cm LA Vol (A4C):   40.6 ml 18.36 ml/m  RA Volume:   25.20 ml  11.40 ml/m LA Biplane Vol: 41.4 ml 18.72 ml/m  AORTIC VALVE LVOT Vmax:   82.90 cm/s LVOT Vmean:  57.700 cm/s LVOT VTI:    0.125 m  SHUNTS Systemic VTI:  0.12 m Systemic Diam: 2.20 cm Oswaldo Milian MD Electronically signed by Oswaldo Milian MD Signature Date/Time: 08/28/2022/11:08:12 AM    Final    DG Chest Portable 1 View  Result Date: 08/26/2022 CLINICAL DATA:  Dyspnea EXAM: PORTABLE CHEST 1 VIEW COMPARISON:  12/30/2021 FINDINGS: Patchy airspace disease at the left greater  than right lung base. Possible small left effusion. Stable cardiomediastinal silhouette IMPRESSION: Patchy airspace disease at the left greater than right lung base, suspect for pneumonia. Possible small left effusion. Radiographic follow-up to resolution is recommended. Electronically Signed   By: Donavan Foil M.D.   On: 08/26/2022 18:46     Discharge Exam: Vitals:   08/30/22 0900 08/30/22 0930  BP: 99/77   Pulse:  90  Resp:    Temp:    SpO2:     Vitals:    08/30/22 0700 08/30/22 0800 08/30/22 0900 08/30/22 0930  BP: 130/82 127/79 99/77   Pulse:    90  Resp:      Temp:      TempSrc:      SpO2:      Weight:      Height:        General: Pt is alert, awake, not in acute distress Cardiovascular: Mildly tachycardic, irregular, S1/S2 +, no rubs, no gallops Respiratory: CTA bilaterally, no wheezing, no rhonchi Abdominal: Soft, NT, ND, bowel sounds + Extremities: no edema, no cyanosis    The results of significant diagnostics from this hospitalization (including imaging, microbiology, ancillary and laboratory) are listed below for reference.     Microbiology: Recent Results (from the past 240 hour(s))  Resp panel by RT-PCR (RSV, Flu A&B, Covid) Anterior Nasal Swab     Status: Abnormal   Collection Time: 08/26/22  6:19 PM   Specimen: Anterior Nasal Swab  Result Value Ref Range Status   SARS Coronavirus 2 by RT PCR NEGATIVE NEGATIVE Final    Comment: (NOTE) SARS-CoV-2 target nucleic acids are NOT DETECTED.  The SARS-CoV-2 RNA is generally detectable in upper respiratory specimens during the acute phase of infection. The lowest concentration of SARS-CoV-2 viral copies this assay can detect is 138 copies/mL. A negative result does not preclude SARS-Cov-2 infection and should not be used as the sole basis for treatment or other patient management decisions. A negative result may occur with  improper specimen collection/handling, submission of specimen other than nasopharyngeal swab, presence of viral mutation(s) within the areas targeted by this assay, and inadequate number of viral copies(<138 copies/mL). A negative result must be combined with clinical observations, patient history, and epidemiological information. The expected result is Negative.  Fact Sheet for Patients:  EntrepreneurPulse.com.au  Fact Sheet for Healthcare Providers:  IncredibleEmployment.be  This test is no t yet approved  or cleared by the [place] FDA and  has been authorized for detection and/or diagnosis of SARS-CoV-2 by FDA under an Emergency Use Authorization (EUA). This EUA will remain  in effect (meaning this test can be used) for the duration of the COVID-19 declaration under Section 564(b)(1) of the Act, 21 U.S.C.section 360bbb-3(b)(1), unless the authorization is terminated  or revoked sooner.       Influenza A by PCR POSITIVE (A) NEGATIVE Final   Influenza B by PCR NEGATIVE NEGATIVE Final    Comment: (NOTE) The Xpert Xpress SARS-CoV-2/FLU/RSV plus assay is intended as an aid in the diagnosis of influenza from Nasopharyngeal swab specimens and should not be used as a sole basis for treatment. Nasal washings and aspirates are unacceptable for Xpert Xpress SARS-CoV-2/FLU/RSV testing.  Fact Sheet for Patients: EntrepreneurPulse.com.au  Fact Sheet for Healthcare Providers: IncredibleEmployment.be  This test is not yet approved or cleared by the [place] FDA and has been authorized for detection and/or diagnosis of SARS-CoV-2 by FDA under an Emergency Use Authorization (EUA). This EUA will remain in effect (meaning  this test can be used) for the duration of the COVID-19 declaration under Section 564(b)(1) of the Act, 21 U.S.C. section 360bbb-3(b)(1), unless the authorization is terminated or revoked.     Resp Syncytial Virus by PCR NEGATIVE NEGATIVE Final    Comment: (NOTE) Fact Sheet for Patients: EntrepreneurPulse.com.au  Fact Sheet for Healthcare Providers: IncredibleEmployment.be  This test is not yet approved or cleared by the [place] FDA and has been authorized for detection and/or diagnosis of SARS-CoV-2 by FDA under an Emergency Use Authorization (EUA). This EUA will remain in effect (meaning this test can be used) for the duration of the COVID-19 declaration under Section 564(b)(1) of the  Act, 21 U.S.C. section 360bbb-3(b)(1), unless the authorization is terminated or revoked.  Performed at [facility], [place] [address], [place], [place] [address]   Culture, blood (routine x 2) Call MD if unable to obtain prior to antibiotics being given     Status: None (Preliminary result)   Collection Time: 08/26/22 10:14 PM   Specimen: BLOOD LEFT HAND  Result Value Ref Range Status   Specimen Description   Final    BLOOD LEFT HAND Performed at [facility] [address], [place], [place] [address]    Special Requests   Final    BOTTLES DRAWN AEROBIC AND ANAEROBIC Blood Culture adequate volume Performed at [facility] [address] [place], [place] [address]    Culture   Final    NO GROWTH 2 DAYS Performed at [facility] [address] [place], [place] [address]    Report Status PENDING  Incomplete  Culture, blood (routine x 2) Call MD if unable to obtain prior to antibiotics being given     Status: None (Preliminary result)   Collection Time: 08/26/22 10:16 PM   Specimen: BLOOD  Result Value Ref Range Status   Specimen Description   Final    BLOOD RIGHT ANTECUBITAL Performed at [facility], [place] [address] [place], [place] [address]    Special Requests   Final    BOTTLES DRAWN AEROBIC AND ANAEROBIC Blood Culture adequate volume Performed at [facility] [address], [place], [place] [address]    Culture   Final    NO GROWTH 2 DAYS Performed at [facility] [address], [place], [place] [address]    Report Status PENDING  Incomplete     Labs: BNP (last 3 results) Recent Labs    12/24/21 1327 12/27/21 0524  BNP 350.7* 96.2   Basic Metabolic Panel: Recent Labs  Lab 08/26/22 1750 08/27/22 0420 08/28/22 0258 08/29/22 0505  NA 134* 134* 137 137  K 5.0 4.1 4.5 4.1  CL 102 104 108 107  CO2 21* 22 21* 21*  GLUCOSE 159* 105* 114* 119*  BUN 25* 19 18  26*  CREATININE 1.83* 1.75* 1.65* 1.68*  CALCIUM 8.5* 7.8* 8.6* 8.3*  MG  --  2.0 1.9 1.9   Liver Function Tests: No results for input(s): "AST", "ALT", "ALKPHOS", "BILITOT", "PROT", "ALBUMIN" in the last 168 hours. No results for input(s): "LIPASE", "AMYLASE" in the last 168 hours. No results for input(s): "AMMONIA" in the last 168 hours. CBC: Recent Labs  Lab 08/26/22 1750 08/27/22 0420 08/28/22 0258 08/29/22 0505  WBC 12.0* 9.2 7.6 7.3  HGB 14.4 12.2* 13.1 13.0  HCT 44.0 37.0* 40.4 40.0  MCV 91.9 91.4 92.0 92.2  PLT 257 231 291 362   Cardiac Enzymes:  No results for input(s): "CKTOTAL", "CKMB", "CKMBINDEX", "TROPONINI" in the last 168 hours. BNP: Invalid input(s): "POCBNP" CBG: No results for input(s): "GLUCAP" in the last 168 hours. D-Dimer No results for input(s): "DDIMER" in the last 72 hours. Hgb A1c No results for input(s): "HGBA1C" in the last 72 hours. Lipid Profile No results for input(s): "CHOL", "HDL", "LDLCALC", "TRIG", "CHOLHDL", "LDLDIRECT" in the last 72 hours. Thyroid function studies Recent Labs    08/28/22 1324  TSH 2.413   Anemia work up No results for input(s): "VITAMINB12", "FOLATE", "FERRITIN", "TIBC", "IRON", "RETICCTPCT" in the last 72 hours. Urinalysis    Component Value Date/Time   COLORURINE YELLOW 08/26/2022 2152   APPEARANCEUR CLEAR 08/26/2022 2152   LABSPEC 1.019 08/26/2022 2152   PHURINE 5.0 08/26/2022 2152   GLUCOSEU NEGATIVE 08/26/2022 2152   HGBUR SMALL (A) 08/26/2022 2152   BILIRUBINUR NEGATIVE 08/26/2022 2152   [place] NEGATIVE 08/26/2022 2152   PROTEINUR 100 (A) 08/26/2022 2152   NITRITE NEGATIVE 08/26/2022 2152   LEUKOCYTESUR NEGATIVE 08/26/2022 2152   Sepsis Labs Recent Labs  Lab 08/26/22 1750 08/27/22 0420 08/28/22 0258 08/29/22 0505  WBC 12.0* 9.2 7.6 7.3   Microbiology Recent Results (from the past 240 hour(s))  Resp panel by RT-PCR (RSV, Flu A&B, Covid) Anterior Nasal Swab     Status: Abnormal    Collection Time: 08/26/22  6:19 PM   Specimen: Anterior Nasal Swab  Result Value Ref Range Status   SARS Coronavirus 2 by RT PCR NEGATIVE NEGATIVE Final    Comment: (NOTE) SARS-CoV-2 target nucleic acids are NOT DETECTED.  The SARS-CoV-2 RNA is generally detectable in upper respiratory specimens during the acute phase of infection. The lowest concentration of SARS-CoV-2 viral copies this assay can detect is 138 copies/mL. A negative result does not preclude SARS-Cov-2 infection and should not be used as the sole basis for treatment or other patient management decisions. A negative result may occur with  improper specimen collection/handling, submission of specimen other than nasopharyngeal swab, presence of viral mutation(s) within the areas targeted by this assay, and inadequate number of viral copies(<138 copies/mL). A negative result must be combined with clinical observations, patient history, and epidemiological information. The expected result is Negative.  Fact Sheet for Patients:  EntrepreneurPulse.com.au  Fact Sheet for Healthcare Providers:  IncredibleEmployment.be  This test is no t yet approved or cleared by the [place] FDA and  has been authorized for detection and/or diagnosis of SARS-CoV-2 by FDA under an Emergency Use Authorization (EUA). This EUA will remain  in effect (meaning this test can be used) for the duration of the COVID-19 declaration under Section 564(b)(1) of the Act, 21 U.S.C.section 360bbb-3(b)(1), unless the authorization is terminated  or revoked sooner.       Influenza A by PCR POSITIVE (A) NEGATIVE Final   Influenza B by PCR NEGATIVE NEGATIVE Final    Comment: (NOTE) The Xpert Xpress SARS-CoV-2/FLU/RSV plus assay is intended as an aid in the diagnosis of influenza from Nasopharyngeal swab specimens and should not be used as a sole basis for treatment. Nasal washings and aspirates are unacceptable for  Xpert Xpress SARS-CoV-2/FLU/RSV testing.  Fact Sheet for Patients: EntrepreneurPulse.com.au  Fact Sheet for Healthcare Providers: IncredibleEmployment.be  This test is not yet approved or cleared by the [place] FDA and has been authorized for detection and/or diagnosis of SARS-CoV-2 by FDA under an Emergency Use Authorization (EUA). This EUA will remain in effect (meaning this test can be used) for the duration of the COVID-19  declaration under Section 564(b)(1) of the Act, 21 U.S.C. section 360bbb-3(b)(1), unless the authorization is terminated or revoked.     Resp Syncytial Virus by PCR NEGATIVE NEGATIVE Final    Comment: (NOTE) Fact Sheet for Patients: EntrepreneurPulse.com.au  Fact Sheet for Healthcare Providers: IncredibleEmployment.be  This test is not yet approved or cleared by the [place] FDA and has been authorized for detection and/or diagnosis of SARS-CoV-2 by FDA under an Emergency Use Authorization (EUA). This EUA will remain in effect (meaning this test can be used) for the duration of the COVID-19 declaration under Section 564(b)(1) of the Act, 21 U.S.C. section 360bbb-3(b)(1), unless the authorization is terminated or revoked.  Performed at [facility], [place] [address] [place], [place] [address]   Culture, blood (routine x 2) Call MD if unable to obtain prior to antibiotics being given     Status: None (Preliminary result)   Collection Time: 08/26/22 10:14 PM   Specimen: BLOOD LEFT HAND  Result Value Ref Range Status   Specimen Description   Final    BLOOD LEFT HAND Performed at [facility] [address] [place], [place] [address]    Special Requests   Final    BOTTLES DRAWN AEROBIC AND ANAEROBIC Blood Culture adequate volume Performed at [facility] [address], [place], [place] [address]    Culture    Final    NO GROWTH 2 DAYS Performed at [facility] [address], [place], [place] [address]    Report Status PENDING  Incomplete  Culture, blood (routine x 2) Call MD if unable to obtain prior to antibiotics being given     Status: None (Preliminary result)   Collection Time: 08/26/22 10:16 PM   Specimen: BLOOD  Result Value Ref Range Status   Specimen Description   Final    BLOOD RIGHT ANTECUBITAL Performed at [facility], [place] [address], [place], [place] [address]    Special Requests   Final    BOTTLES DRAWN AEROBIC AND ANAEROBIC Blood Culture adequate volume Performed at [facility] [address], [place], [place] [address]    Culture   Final    NO GROWTH 2 DAYS Performed at [facility] [address], [place], [place] [address]    Report Status PENDING  Incomplete     Time coordinating discharge: 35 minutes  SIGNED:   Rodena Goldmann, DO [facility] 08/30/2022, 10:39 AM  If 7PM-7AM, please contact night-coverage [email or web address]

## 2022-08-30 NOTE — Progress Notes (Signed)
Patient discharged home.  Discharge instructions explained to both pt and spouse, both verbalize understanding.

## 2022-09-01 ENCOUNTER — Telehealth: Payer: Self-pay

## 2022-09-01 LAB — CULTURE, BLOOD (ROUTINE X 2)
Culture: NO GROWTH
Culture: NO GROWTH
Special Requests: ADEQUATE
Special Requests: ADEQUATE

## 2022-09-01 NOTE — Telephone Encounter (Signed)
**Note De-identified Ifeanyichukwu Wickham Obfuscation** -----  **Note De-Identified Nylani Michetti Obfuscation** Message from Ledora Bottcher, [place] sent at 08/30/2022 10:02 AM EST ----- Pt will need a TOC phone call.  Thanks Angie

## 2022-09-01 NOTE — Telephone Encounter (Signed)
**Note De-Identified Dio Giller Obfuscation** Patient's wife and DPR, Hoyle Sauer contacted regarding the patient's discharge from [facility] on 08/30/2022  Hoyle Sauer understands that the patient has a follow up with provider Christen Bame, NP on 09/11/2022 at 10:55 at [place] understands the patient's discharge instructions? Yes Hoyle Sauer understands the patient's medications and regiment? Yes Hoyle Sauer understands that the patient needs to bring all medications to this visit? Yes  Ask Hoyle Sauer:  Are you enrolled in My Chart: Yes  Per Hoyle Sauer the patient is doing well at this time and that he denies having any CP/discomfort, nausea, vomiting, diaphoresis, dizziness, or lightheadedness. I did provide [place] phone number and advised her to call us if they have any questions or concerns. Hoyle Sauer thanked me for my call.

## 2022-09-03 ENCOUNTER — Telehealth: Payer: Self-pay | Admitting: Cardiology

## 2022-09-03 NOTE — Telephone Encounter (Signed)
New Message:     Patient's wife called. She is confused about patient's appointment. She siad his dischargee sheet  States that he has an appointment on 09-11-22 with Christen Bame and that he needs an appointment with Dr Marlou Porch. Who should the patient see?

## 2022-09-03 NOTE — Telephone Encounter (Signed)
Pt wife Stanley Juarez called back per the message below.    [name] frustrated about communication received from hospital regarding follow up with [facility]?  She states she was told Dr. Marlou Porch, then scheduled with Ms. Swinyer NP.  She did not have a problem with the provider, just the process was confusing to her.    I accessed Dr. Marlou Porch schedule, and offered available appointments this week and next week, and Stanley Juarez wanted to keep the post hospital f/u visit with Ms. Swinyer NP on 09/11/22.  I reassured her Ms. Swinyer NP is an excellent provider, and will see / assess / and establish a plan of care for her husband.  Stanley Juarez was educated on [facility]'s commitment to customer service, and that we are flexible to meet her healthcare needs.   Stanley Juarez appreciated the time to talk / discuss this.    Stanley Juarez will bring her husband into see Ms. Swinyer NP, no other follow up required at this time.

## 2022-09-09 DIAGNOSIS — I10 Essential (primary) hypertension: Secondary | ICD-10-CM | POA: Diagnosis not present

## 2022-09-09 DIAGNOSIS — J189 Pneumonia, unspecified organism: Secondary | ICD-10-CM | POA: Diagnosis not present

## 2022-09-09 DIAGNOSIS — D6869 Other thrombophilia: Secondary | ICD-10-CM | POA: Diagnosis not present

## 2022-09-09 DIAGNOSIS — J101 Influenza due to other identified influenza virus with other respiratory manifestations: Secondary | ICD-10-CM | POA: Diagnosis not present

## 2022-09-09 DIAGNOSIS — I4891 Unspecified atrial fibrillation: Secondary | ICD-10-CM | POA: Diagnosis not present

## 2022-09-09 DIAGNOSIS — A419 Sepsis, unspecified organism: Secondary | ICD-10-CM | POA: Diagnosis not present

## 2022-09-09 DIAGNOSIS — N179 Acute kidney failure, unspecified: Secondary | ICD-10-CM | POA: Diagnosis not present

## 2022-09-09 NOTE — Progress Notes (Signed)
Cardiology Office Note:    Date:  09/11/2022   ID:  Stanley Juarez, DOB 01-27-1946, MRN [id number]  PCP:  Mayra Neer, MD   [facility] Providers Cardiologist:  Candee Furbish, MD     Referring MD: Mayra Neer, MD   Chief Complaint: Hospital follow-up  History of Present Illness:    Stanley Juarez is a very pleasant 77 y.o. male with a hx of DVT/PE on chronic anticoagulation, HTN, sleep apnea, right renal oncocytoma s/p nephrectomy, new onset atrial fibrillation, no additional cardiac history prior to admission.   Admission 1/2-08/30/22 for fever, cough, and shortness of breath, sent from urgent care for pneumonia. He tested positive for influenza A and CXR showed patchy airspace disease at L>R lung base, suspicious for pneumonia.  Found to be in atrial flutter/fib in the ED with HR in the 120s to 130s, hs troponin 15. Admitted for treatment of sepsis secondary to PNA, influenza A.  Has been maintained on Xarelto 20 mg daily for VTE. Cardiology consulted for atrial flutter/fib. On review of telemetry, appears in is in a fib with HR 100-120s. Echocardiogram on 08/28/22 revealed EF 55-60%, mild LVH, normal RV systolic function.  He reported he can occasionally feel a fluttering in his chest, especially when he tries to go to sleep.  Prior to admission he had felt this fluttering in his chest before but is difficult to say how often or when it first began.  He denied chest pain or shortness of breath at rest. Has been fatigued for about one month, no edema. Reported good compliance with CPAP for OSA. Continue Xarelto for stroke prevention, CHA2DS2-VASc score equals 2. TSH within normal limits.  Today, he is here with his wife for hospital follow-up. Reports he feels he is getting stronger each day.Is sleeping better.  Occasionally feels fluttering in his left shoulder.  Shortness of breath is improving.  He denies orthopnea, PND, edema, chest pain, presyncope, syncope. It has been a challenging  year for both of them and they are both very emotional today. Feel very grateful that the PE was found and that the growth on the right kidney was benign. Watching growth on left kidney - has CT next week.   Past Medical History:  Diagnosis Date   Arthritis    back   Cancer ([place])    Hypertension    Pneumonia    Sleep apnea     Past Surgical History:  Procedure Laterality Date   CATARACT EXTRACTION     IR ANGIOGRAM PULMONARY BILATERAL SELECTIVE  12/25/2021   IR INFUSION THROMBOL ARTERIAL INITIAL (MS)  12/25/2021   IR INFUSION THROMBOL ARTERIAL INITIAL (MS)  12/25/2021   IR RADIOLOGIST EVAL & MGMT  01/30/2022   IR THROMB F/U EVAL ART/VEN FINAL DAY (MS)  12/25/2021   IR US GUIDE VASC ACCESS RIGHT  12/25/2021   IR US GUIDE VASC ACCESS RIGHT  12/25/2021   MENISCUS REPAIR Right    ROBOT ASSISTED LAPAROSCOPIC NEPHRECTOMY Right 05/09/2022   Procedure: XI ROBOTIC ASSISTED LAPAROSCOPIC NEPHRECTOMY;  Surgeon: Ceasar Mons, MD;  Location: [facility];  Service: Urology;  Laterality: Right;  ONLY NEEDS 180 MIN   ROTATOR CUFF REPAIR Right    skin cancer removal      Current Medications: Current Meds  Medication Sig   amiodarone (PACERONE) 400 MG tablet Take 1 tablet (400 mg total) by mouth 2 (two) times daily.   diltiazem (CARDIZEM CD) 240 MG 24 hr capsule Take 1 capsule (240  mg total) by mouth daily.   docusate sodium (COLACE) 100 MG capsule Take 1 capsule (100 mg total) by mouth 2 (two) times daily. (Patient taking differently: Take 100 mg by mouth daily as needed for mild constipation.)   metoprolol tartrate (LOPRESSOR) 25 MG tablet Take 1 tablet (25 mg total) by mouth 2 (two) times daily.   Polyethyl Glycol-Propyl Glycol (LUBRICANT EYE DROPS) 0.4-0.3 % SOLN Place 1 drop into both eyes in the morning.   rivaroxaban (XARELTO) 20 MG TABS tablet Take 1 tablet (20 mg total) by mouth daily with supper.   sodium chloride (NASAL MOIST) 0.65 % nasal spray Place 1 spray into the nose at  bedtime as needed (dryness due to CPAP).   traZODone (DESYREL) 100 MG tablet Take 100 mg by mouth at bedtime as needed for sleep.     Allergies:   Prednisone and Penicillins   Social History   Socioeconomic History   Marital status: Married    Spouse name: Not on file   Number of children: Not on file   Years of education: Not on file   Highest education level: Not on file  Occupational History   Not on file  Tobacco Use   Smoking status: Never   Smokeless tobacco: Never  Vaping Use   Vaping Use: Never used  Substance and Sexual Activity   Alcohol use: Never   Drug use: Never   Sexual activity: Not on file  Other Topics Concern   Not on file  Social History Narrative   Not on file   Social Determinants of Health   Financial Resource Strain: Not on file  Food Insecurity: No Food Insecurity (08/28/2022)   Hunger Vital Sign    Worried About Running Out of Food in the Last Year: Never true    Ran Out of Food in the Last Year: Never true  Transportation Needs: No Transportation Needs (08/28/2022)   PRAPARE - Hydrologist (Medical): No    Lack of Transportation (Non-Medical): No  Physical Activity: Not on file  Stress: Not on file  Social Connections: Not on file     Family History: The patient's family history is not on file.  ROS:   Please see the history of present illness.   All other systems reviewed and are negative.  Labs/Other Studies Reviewed:    The following studies were reviewed today:  Echo Limited 08/28/22  1. Left ventricular ejection fraction, by estimation, is 55 to 60%. There  is mild concentric left ventricular hypertrophy.   2. Right ventricular systolic function is normal. The right ventricular  size is normal.   3. The mitral valve is normal in structure. No evidence of mitral valve  regurgitation.   4. The aortic valve was not well visualized. Aortic valve regurgitation  is not visualized.   5. The inferior vena  cava is normal in size with greater than 50%  respiratory variability, suggesting right atrial pressure of 3 mmHg.  Echo 04/09/22 1. Left ventricular ejection fraction, by estimation, is 60 to 65%. The  left ventricle has normal function. The left ventricle has no regional  wall motion abnormalities. Left ventricular diastolic parameters are  consistent with Grade II diastolic  dysfunction (pseudonormalization). The average left ventricular global  longitudinal strain is -23.4 %. The global longitudinal strain is normal.   2. Right ventricular systolic function is normal. The right ventricular  size is normal. Tricuspid regurgitation signal is inadequate for assessing  PA pressure.  3. Right atrial size was mildly dilated.   4. The mitral valve is normal in structure. Trivial mitral valve  regurgitation. No evidence of mitral stenosis.   5. The aortic valve is tricuspid. Aortic valve regurgitation is not  visualized. No aortic stenosis is present.   6. Aortic dilatation noted. There is mild dilatation of the ascending  aorta, measuring 40 mm.   7. The inferior vena cava is normal in size with greater than 50%  respiratory variability, suggesting right atrial pressure of 3 mmHg.  Recent Labs: 12/26/2021: ALT 19 12/27/2021: B Natriuretic Peptide 72.3 08/28/2022: TSH 2.413 08/29/2022: BUN 26; Creatinine, Ser 1.68; Hemoglobin 13.0; Magnesium 1.9; Platelets 362; Potassium 4.1; Sodium 137  Recent Lipid Panel No results found for: "CHOL", "TRIG", "HDL", "CHOLHDL", "VLDL", "LDLCALC", "LDLDIRECT"   Risk Assessment/Calculations:    CHA2DS2-VASc Score = 2  This indicates a 2.2% annual risk of stroke. The patient's score is based upon: CHF History: 0 HTN History: 1 Diabetes History: 0 Stroke History: 0 Vascular Disease History: 0 Age Score: 1 Gender Score: 0    Physical Exam:    VS:  BP 102/72   Pulse 90   Ht '5\' 10"'$  (1.778 m)   Wt 233 lb 3.2 oz (105.8 kg)   SpO2 96%   BMI 33.46 kg/m      Wt Readings from Last 3 Encounters:  09/11/22 233 lb 3.2 oz (105.8 kg)  08/30/22 224 lb 6.9 oz (101.8 kg)  07/08/22 236 lb 9.6 oz (107.3 kg)     GEN:  Well nourished, well developed in no acute distress HEENT: Normal NECK: No JVD; No carotid bruits CARDIAC: Irregular RR, no murmurs, rubs, gallops RESPIRATORY:  Clear to auscultation without rales, wheezing or rhonchi  ABDOMEN: Soft, non-tender, non-distended MUSCULOSKELETAL:  No edema; No deformity. 2+ pedal pulses, equal bilaterally SKIN: Warm and dry NEUROLOGIC:  Alert and oriented x 3 PSYCHIATRIC:  Normal affect   EKG:  EKG is ordered today.  The ekg ordered today demonstrates atrial fibrillation with PVC versus aberrantly conducted complex at 90 bpm, no ST abnormality  Diagnoses:    1. Persistent atrial fibrillation ([place])   2. Primary hypertension   3. Obstructive sleep apnea syndrome   4. History of pulmonary embolism   5. AKI (acute kidney injury) ([place])   6. Secondary hypercoagulable state ([place])    Assessment and Plan:     Persistent atrial fibrillation on chronic anticoagulation: New onset a fib/flutter in the setting of pneumonia and flu hospitalization 08/26/22. Remains in atrial fibrillation today with well controlled ventricular rate at 90 bpm. Not very symptomatic, feels that he is getting stronger daily. Lengthy discussion about treatment options for A-fib including DCCV, AAD, continued rate control, and ablation. No atrial dilatation on recent echo, which is encouraging. Would like to pursue cardioversion and possibly AAD or ablation in the future if cardioversion is unsuccessful. Will load him with amiodarone 400 mg twice daily and see him back in 2 weeks to plan for DCCV if still in a fib. No missed doses of Xarelto since at least 08/26/22. We are counting forward 3 weeks from this date for DCCV if needed. Continue rate control with diltiazem and metoprolol. No bleeding concerns, continue Xarelto 20 mg daily which is  appropriate dose for his CrCl.   History of DVT/PE: Submassive PE, RLE DVT 12/2021 felt to be 2/2 renal cancer. Was found to have oncocytoma, benign, not renal cancer. Has been on Xarelto 20 mg since May 2023. No missed  doses since at least 08/26/22. Continue Xarelto 20 mg daily.  AKI: Creatinine elevated during hospitalization with SCR at 1.68 on 08/29/22. Had renal oncocytoma s/p Rt nephrectomy 04/2022. Has a growth on left kidney that is being monitored.  We will continue to monitor kidney function closely. Will recheck BMP at next appointment in preparation for DCCV if he remains in a fib.   Hypertension: BP is well controlled. No concerning symptoms of orthostasis.   OSA on CPAP: Compliant. No concerns today.    Disposition: 2 weeks with me  Medication Adjustments/Labs and Tests Ordered: Current medicines are reviewed at length with the patient today.  Concerns regarding medicines are outlined above.  Orders Placed This Encounter  Procedures   Amb Referral to AFIB Clinic   EKG 12-Lead   Meds ordered this encounter  Medications   amiodarone (PACERONE) 400 MG tablet    Sig: Take 1 tablet (400 mg total) by mouth 2 (two) times daily.    Dispense:  60 tablet    Refill:  6    Patient Instructions  Medication Instructions:    START Amiodarone one (1) tablet by mouth ( 400 mg) twice daily.   *If you need a refill on your cardiac medications before your next appointment, please call your pharmacy*   Lab Work:  None ordered.  If you have labs (blood work) drawn today and your tests are completely normal, you will receive your results only by: [place] (if you have MyChart) OR A paper copy in the mail If you have any lab test that is abnormal or we need to change your treatment, we will call you to review the results.   Testing/Procedures:      Dear [name]  You are scheduled for a Cardioversion on Monday, February 5 with Dr. Harrington Challenger.  Please arrive at the [facility] (Main Entrance A) at [facility]: [address] [place], [place] [address] at 10:00 AM.    DIET:  Nothing to eat or drink after midnight except a sip of water with medications (see medication instructions below)  MEDICATION INSTRUCTIONS: None Continue taking your anticoagulant (blood thinner): Rivaroxaban (Xarelto).  You will need to continue this after your procedure until you are told by your provider that it is safe to stop.    LABS:    Labs will be on the day you see Lorenda Peck back.   You must have a responsible person to drive you home and stay in the waiting area during your procedure. Failure to do so could result in cancellation.  Bring your insurance cards.  *Special Note: Every effort is made to have your procedure done on time. Occasionally there are emergencies that occur at the hospital that may cause delays. Please be patient if a delay does occur.   Follow-Up: At [facility], you and your health needs are our priority.  As part of our continuing mission to provide you with exceptional heart care, we have created designated Provider Care Teams.  These Care Teams include your primary Cardiologist (physician) and Advanced Practice Providers (APPs -  Physician Assistants and Nurse Practitioners) who all work together to provide you with the care you need, when you need it.  We recommend signing up for the patient portal called "MyChart".  Sign up information is provided on this After Visit Summary.  MyChart is used to connect with patients for Virtual Visits (Telemedicine).  Patients are able to view lab/test results, encounter notes, upcoming  appointments, etc.  Non-urgent messages can be sent to your provider as well.   To learn more about what you can do with MyChart, go to [email or web address].    Your next appointment:   3 month(s)  Provider:   Candee Furbish, MD     Other Instructions  AFIB CLINIC INFORMATION:  The AFib Clinic is  located on the [facility] at [address]  Patients should enter through Entrance "A" of [facility] and go to [facility] (immediately to the left of the entrance) to the 6th floor. Once you exit the elevator on the 6th floor check in with the [place] to the right of elevator. Valet parking is available at the entrance. Phone number: [phone]       Signed, Emmaline Life, NP  09/11/2022 3:57 PM    [place]

## 2022-09-11 ENCOUNTER — Encounter: Payer: Self-pay | Admitting: Nurse Practitioner

## 2022-09-11 ENCOUNTER — Ambulatory Visit: Payer: Medicare HMO | Attending: Nurse Practitioner | Admitting: Nurse Practitioner

## 2022-09-11 VITALS — BP 102/72 | HR 90 | Ht 70.0 in | Wt 233.2 lb

## 2022-09-11 DIAGNOSIS — N179 Acute kidney failure, unspecified: Secondary | ICD-10-CM | POA: Diagnosis not present

## 2022-09-11 DIAGNOSIS — D49512 Neoplasm of unspecified behavior of left kidney: Secondary | ICD-10-CM | POA: Diagnosis not present

## 2022-09-11 DIAGNOSIS — Z86711 Personal history of pulmonary embolism: Secondary | ICD-10-CM

## 2022-09-11 DIAGNOSIS — G4733 Obstructive sleep apnea (adult) (pediatric): Secondary | ICD-10-CM | POA: Diagnosis not present

## 2022-09-11 DIAGNOSIS — I10 Essential (primary) hypertension: Secondary | ICD-10-CM | POA: Diagnosis not present

## 2022-09-11 DIAGNOSIS — I4819 Other persistent atrial fibrillation: Secondary | ICD-10-CM

## 2022-09-11 DIAGNOSIS — Z85528 Personal history of other malignant neoplasm of kidney: Secondary | ICD-10-CM | POA: Diagnosis not present

## 2022-09-11 DIAGNOSIS — D6869 Other thrombophilia: Secondary | ICD-10-CM | POA: Diagnosis not present

## 2022-09-11 MED ORDER — AMIODARONE HCL 400 MG PO TABS: 400.0000 mg | ORAL_TABLET | Freq: Two times a day (BID) | ORAL | 6 refills | Status: DC

## 2022-09-11 NOTE — Patient Instructions (Signed)
Medication Instructions:    START Amiodarone one (1) tablet by mouth ( 400 mg) twice daily.   *If you need a refill on your cardiac medications before your next appointment, please call your pharmacy*   Lab Work:  None ordered.  If you have labs (blood work) drawn today and your tests are completely normal, you will receive your results only by: [place] (if you have MyChart) OR A paper copy in the mail If you have any lab test that is abnormal or we need to change your treatment, we will call you to review the results.   Testing/Procedures:      Dear Stanley Juarez  You are scheduled for a Cardioversion on Monday, February 5 with Dr. Harrington Challenger.  Please arrive at the [facility] (Main Entrance A) at [facility]: [address] [place], [place] [address] at 10:00 AM.    DIET:  Nothing to eat or drink after midnight except a sip of water with medications (see medication instructions below)  MEDICATION INSTRUCTIONS: None Continue taking your anticoagulant (blood thinner): Rivaroxaban (Xarelto).  You will need to continue this after your procedure until you are told by your provider that it is safe to stop.    LABS:    Labs will be on the day you see Stanley Juarez back.   You must have a responsible person to drive you home and stay in the waiting area during your procedure. Failure to do so could result in cancellation.  Bring your insurance cards.  *Special Note: Every effort is made to have your procedure done on time. Occasionally there are emergencies that occur at the hospital that may cause delays. Please be patient if a delay does occur.   Follow-Up: At [facility], you and your health needs are our priority.  As part of our continuing mission to provide you with exceptional heart care, we have created designated Provider Care Teams.  These Care Teams include your primary Cardiologist (physician) and Advanced Practice Providers (APPs -   Physician Assistants and Nurse Practitioners) who all work together to provide you with the care you need, when you need it.  We recommend signing up for the patient portal called "MyChart".  Sign up information is provided on this After Visit Summary.  MyChart is used to connect with patients for Virtual Visits (Telemedicine).  Patients are able to view lab/test results, encounter notes, upcoming appointments, etc.  Non-urgent messages can be sent to your provider as well.   To learn more about what you can do with MyChart, go to [email or web address].    Your next appointment:   3 month(s)  Provider:   Candee Furbish, MD     Other Instructions  AFIB CLINIC INFORMATION:  The AFib Clinic is located on the [facility] at [address]  Patients should enter through Entrance "A" of [facility] and go to [facility] (immediately to the left of the entrance) to the 6th floor. Once you exit the elevator on the 6th floor check in with the [place] to the right of elevator. Valet parking is available at the entrance. Phone number: [phone]

## 2022-09-15 ENCOUNTER — Telehealth: Payer: Self-pay | Admitting: Cardiology

## 2022-09-15 DIAGNOSIS — K760 Fatty (change of) liver, not elsewhere classified: Secondary | ICD-10-CM | POA: Diagnosis not present

## 2022-09-15 DIAGNOSIS — N2881 Hypertrophy of kidney: Secondary | ICD-10-CM | POA: Diagnosis not present

## 2022-09-15 DIAGNOSIS — N200 Calculus of kidney: Secondary | ICD-10-CM | POA: Diagnosis not present

## 2022-09-15 DIAGNOSIS — K5730 Diverticulosis of large intestine without perforation or abscess without bleeding: Secondary | ICD-10-CM | POA: Diagnosis not present

## 2022-09-15 DIAGNOSIS — D49512 Neoplasm of unspecified behavior of left kidney: Secondary | ICD-10-CM | POA: Diagnosis not present

## 2022-09-15 NOTE — Telephone Encounter (Signed)
He is having some perceived side effects since starting the amiodarone.  Since we have him set up for a cardioversion, lets go ahead and stop the amiodarone.  He may be able to maintain sinus rhythm without this antiarrhythmic.  We can always use amiodarone or antiarrhythmic in the future if cardioversion without antiarrhythmic fails.  Candee Furbish, MD Pilar Jarvis aware of the information above.  They will discontinue the Amiodarone and f/u with [facility] as scheduled 09/26/22.   They will c/b if any further questions or concerns.

## 2022-09-15 NOTE — Telephone Encounter (Signed)
Spoke with wife Hoyle Sauer who is reporting pt starting taking Amiodarone as ordered.  On Friday he noticed increased swelling in feet up to ankles but not into legs.  Pt wear compression stocking daily.  This morning the swelling seemed to be worse.  Pt has also been c/o feeling wobbly today.  This sensation was further described as lightheaded.  Pt is also c/o constipation which is abnormal for him.  Advised to increase water and fiber intake as well as activity for improved bowel function.  BP 147/83 HT 80 at time of the call today.  Wife aware I will forward this information to Dr Marlou Porch for review and further orders.

## 2022-09-15 NOTE — Telephone Encounter (Signed)
Pt c/o medication issue:  1. Name of Medication:   amiodarone (PACERONE) 400 MG tablet    2. How are you currently taking this medication (dosage and times per day)? Take 1 tablet (400 mg total) by mouth 2 (two) times daily.   3. Are you having a reaction (difficulty breathing--STAT)? No  4. What is your medication issue? Wife states that since starting medication pt has experienced constipation, feet swelling and has become "wobbley" as of this morning. She would like a callback regarding this matter. Please advise

## 2022-09-18 ENCOUNTER — Ambulatory Visit: Payer: Medicare HMO | Admitting: Nurse Practitioner

## 2022-09-18 DIAGNOSIS — D49512 Neoplasm of unspecified behavior of left kidney: Secondary | ICD-10-CM | POA: Diagnosis not present

## 2022-09-18 NOTE — Progress Notes (Signed)
Cardiology Office Note:    Date:  09/26/2022   ID:  KEVONTAY BURKS, DOB 1945-11-03, MRN [id number]  PCP:  Mayra Neer, MD   [facility] Providers Cardiologist:  Candee Furbish, MD     Referring MD: Mayra Neer, MD   Chief Complaint: Hospital follow-up  History of Present Illness:    Stanley Juarez is a very pleasant 77 y.o. male with a hx of DVT/PE on chronic anticoagulation, HTN, sleep apnea, right renal oncocytoma s/p nephrectomy, new onset atrial fibrillation, no additional cardiac history prior to admission.   Admission 1/2-08/30/22 for fever, cough, and shortness of breath, sent from urgent care for pneumonia. He tested positive for influenza A and CXR showed patchy airspace disease at L>R lung base, suspicious for pneumonia.  Found to be in atrial flutter/fib in the ED with HR in the 120s to 130s, hs troponin 15. Admitted for treatment of sepsis secondary to PNA, influenza A.  Has been maintained on Xarelto 20 mg daily for VTE. Cardiology consulted for atrial flutter/fib. On review of telemetry, appears in is in a fib with HR 100-120s. Echocardiogram on 08/28/22 revealed EF 55-60%, mild LVH, normal RV systolic function.  He reported he can occasionally feel a fluttering in his chest, especially when he tries to go to sleep.  Prior to admission he had felt this fluttering in his chest before but is difficult to say how often or when it first began.  He denied chest pain or shortness of breath at rest. Has been fatigued for about one month, no edema. Reported good compliance with CPAP for OSA. Continue Xarelto for stroke prevention, CHA2DS2-VASc score equals 2. TSH within normal limits.  Seen by me on 09/11/22 with his wife for hospital follow-up. Feeling stronger each day since hospital d/c. Is sleeping better.  Occasionally feels fluttering in his left shoulder.  Shortness of breath is improving.  Denied orthopnea, PND, edema, chest pain, presyncope, syncope. It has been a challenging  year for both of them and they are both very emotional today. Feel very grateful that the PE was found and that the growth on the right kidney was benign. Watching growth on left kidney - has CT next week. In atrial fibrillation with well controlled ventricular rate at 90 bpm. Not very symptomatic. Lengthy discussion about treatment options for A-fib including DCCV, AAD, continued rate control, and ablation. No atrial dilatation on recent echo, which is encouraging. He agreed to pursue cardioversion and possibly AAD or ablation in the future if cardioversion is unsuccessful. Will load him with amiodarone 400 mg twice daily and see him back in 2 weeks to plan for DCCV if still in a fib. No missed doses of Xarelto since at least 08/26/22.   Today, he is here with his wife for follow-up. Reports he did not tolerate amiodarone 2/2 constipation, flushing, and elevated BP. Has felt better since he stopped it. Continues to have fatigue, cannot do as much as he previously could. Does not have palpitations or feelings of his heart racing. Otherwise, feels that he is improving since hospitalization. He denies chest pain, shortness of breath, lower extremity edema, palpitations, melena, hematuria, hemoptysis, diaphoresis, weakness, presyncope, syncope, orthopnea, and PND. He is hopeful that he will be restored to sinus rhythm with cardioversion and is anxious to get it done.    Past Medical History:  Diagnosis Date   Arthritis    back   Cancer ([place])    Hypertension    Pneumonia  Sleep apnea     Past Surgical History:  Procedure Laterality Date   CATARACT EXTRACTION     IR ANGIOGRAM PULMONARY BILATERAL SELECTIVE  12/25/2021   IR INFUSION THROMBOL ARTERIAL INITIAL (MS)  12/25/2021   IR INFUSION THROMBOL ARTERIAL INITIAL (MS)  12/25/2021   IR RADIOLOGIST EVAL & MGMT  01/30/2022   IR THROMB F/U EVAL ART/VEN FINAL DAY (MS)  12/25/2021   IR US GUIDE VASC ACCESS RIGHT  12/25/2021   IR US GUIDE VASC ACCESS  RIGHT  12/25/2021   MENISCUS REPAIR Right    ROBOT ASSISTED LAPAROSCOPIC NEPHRECTOMY Right 05/09/2022   Procedure: XI ROBOTIC ASSISTED LAPAROSCOPIC NEPHRECTOMY;  Surgeon: Ceasar Mons, MD;  Location: [facility];  Service: Urology;  Laterality: Right;  ONLY NEEDS 180 MIN   ROTATOR CUFF REPAIR Right    skin cancer removal      Current Medications: Current Meds  Medication Sig   ARTIFICIAL TEAR SOLUTION OP Place 1 drop into both eyes 2 (two) times daily.   rivaroxaban (XARELTO) 20 MG TABS tablet Take 1 tablet (20 mg total) by mouth daily with supper.   senna-docusate (SENOKOT-S) 8.6-50 MG tablet Take 1 tablet by mouth daily as needed for mild constipation.   sodium chloride (NASAL MOIST) 0.65 % nasal spray Place 1 spray into the nose at bedtime.   traZODone (DESYREL) 100 MG tablet Take 100 mg by mouth at bedtime.   [DISCONTINUED] diltiazem (CARDIZEM CD) 240 MG 24 hr capsule Take 1 capsule (240 mg total) by mouth daily.   [DISCONTINUED] metoprolol tartrate (LOPRESSOR) 25 MG tablet Take 1 tablet (25 mg total) by mouth 2 (two) times daily.     Allergies:   Amiodarone, Prednisone, and Penicillins   Social History   Socioeconomic History   Marital status: Married    Spouse name: Not on file   Number of children: Not on file   Years of education: Not on file   Highest education level: Not on file  Occupational History   Not on file  Tobacco Use   Smoking status: Never   Smokeless tobacco: Never  Vaping Use   Vaping Use: Never used  Substance and Sexual Activity   Alcohol use: Never   Drug use: Never   Sexual activity: Not on file  Other Topics Concern   Not on file  Social History Narrative   Not on file   Social Determinants of Health   Financial Resource Strain: Not on file  Food Insecurity: No Food Insecurity (08/28/2022)   Hunger Vital Sign    Worried About Running Out of Food in the Last Year: Never true    Ran Out of Food in the Last Year: Never true   Transportation Needs: No Transportation Needs (08/28/2022)   PRAPARE - Hydrologist (Medical): No    Lack of Transportation (Non-Medical): No  Physical Activity: Not on file  Stress: Not on file  Social Connections: Not on file     Family History: The patient's family history is not on file.  ROS:   Please see the history of present illness.  + fatigue All other systems reviewed and are negative.  Labs/Other Studies Reviewed:    The following studies were reviewed today:  Echo Limited 08/28/22  1. Left ventricular ejection fraction, by estimation, is 55 to 60%. There  is mild concentric left ventricular hypertrophy.   2. Right ventricular systolic function is normal. The right ventricular  size is normal.   3.  The mitral valve is normal in structure. No evidence of mitral valve  regurgitation.   4. The aortic valve was not well visualized. Aortic valve regurgitation  is not visualized.   5. The inferior vena cava is normal in size with greater than 50%  respiratory variability, suggesting right atrial pressure of 3 mmHg.  Echo 04/09/22 1. Left ventricular ejection fraction, by estimation, is 60 to 65%. The  left ventricle has normal function. The left ventricle has no regional  wall motion abnormalities. Left ventricular diastolic parameters are  consistent with Grade II diastolic  dysfunction (pseudonormalization). The average left ventricular global  longitudinal strain is -23.4 %. The global longitudinal strain is normal.   2. Right ventricular systolic function is normal. The right ventricular  size is normal. Tricuspid regurgitation signal is inadequate for assessing  PA pressure.   3. Right atrial size was mildly dilated.   4. The mitral valve is normal in structure. Trivial mitral valve  regurgitation. No evidence of mitral stenosis.   5. The aortic valve is tricuspid. Aortic valve regurgitation is not  visualized. No aortic stenosis is  present.   6. Aortic dilatation noted. There is mild dilatation of the ascending  aorta, measuring 40 mm.   7. The inferior vena cava is normal in size with greater than 50%  respiratory variability, suggesting right atrial pressure of 3 mmHg.  Recent Labs: 12/26/2021: ALT 19 12/27/2021: B Natriuretic Peptide 72.3 08/28/2022: TSH 2.413 08/29/2022: BUN 26; Creatinine, Ser 1.68; Hemoglobin 13.0; Magnesium 1.9; Platelets 362; Potassium 4.1; Sodium 137  Recent Lipid Panel No results found for: "CHOL", "TRIG", "HDL", "CHOLHDL", "VLDL", "LDLCALC", "LDLDIRECT"   Risk Assessment/Calculations:    CHA2DS2-VASc Score = 2  This indicates a 2.2% annual risk of stroke. The patient's score is based upon: CHF History: 0 HTN History: 1 Diabetes History: 0 Stroke History: 0 Vascular Disease History: 0 Age Score: 1 Gender Score: 0    Physical Exam:    VS:  BP 126/70   Pulse 86   Ht '5\' 10"'$  (1.778 m)   Wt 238 lb 9.6 oz (108.2 kg)   SpO2 97%   BMI 34.24 kg/m     Wt Readings from Last 3 Encounters:  09/26/22 238 lb 9.6 oz (108.2 kg)  09/11/22 233 lb 3.2 oz (105.8 kg)  08/30/22 224 lb 6.9 oz (101.8 kg)     GEN:  Well nourished, well developed in no acute distress HEENT: Normal NECK: No JVD; No carotid bruits CARDIAC: Irregular RR, no murmurs, rubs, gallops RESPIRATORY:  Clear to auscultation without rales, wheezing or rhonchi  ABDOMEN: Soft, non-tender, non-distended MUSCULOSKELETAL:  No edema; No deformity. 2+ pedal pulses, equal bilaterally SKIN: Warm and dry NEUROLOGIC:  Alert and oriented x 3 PSYCHIATRIC:  Normal affect   EKG:  EKG is ordered today. EKG reveals atrial fibrillation with well-controlled ventricular rate at 86 bpm  Diagnoses:    1. Persistent atrial fibrillation ([place])   2. Primary hypertension   3. Atrial flutter by electrocardiogram ([place])   4. Secondary hypercoagulable state ([place])   5. Obstructive sleep apnea syndrome   6. History of pulmonary embolism   7. Stage  3b chronic kidney disease (CKD) ([place])   8. History of DVT (deep vein thrombosis)     Assessment and Plan:     Persistent atrial fibrillation on chronic anticoagulation: New onset a fib/flutter in the setting of pneumonia and flu hospitalization 08/26/22. Remains in atrial fibrillation today with well controlled ventricular rate at 86  bpm. Was loaded on amiodarone  400 mg twice daily but did not tolerate it. Symptomatic with fatigue. Reviewed treatment options again that were reviewed at length at last office visit.  Risks of cardioversion discussed and he would like to proceed.  Will follow-up with [facility] Fib clinic 1 week post DCCV. No missed doses of Xarelto since at least 08/26/22. Continue rate control with diltiazem and metoprolol. No bleeding concerns, continue Xarelto 20 mg daily which is appropriate dose for his CrCl.   History of DVT/PE: Submassive PE, RLE DVT 12/2021 felt to be 2/2 renal cancer. Was found to have oncocytoma, benign, not renal cancer. No concerns today. On chronic OAC, no missed doses since at least 08/26/22. Continue Xarelto 20 mg daily.  CKD: Scr 1.68, GFR 42 on lab work 08/29/21. Hx of renal oncocytoma s/p Rt nephrectomy 04/2022. Has a growth on left kidney that is being monitored.  We will recheck today.  Hypertension: BP is well controlled.   OSA on CPAP: Compliant. Checks apneic episode report frequently. Has appointment with sleep medicine in a few months.    Shared Decision Making/Informed Consent The risks (stroke, cardiac arrhythmias rarely resulting in the need for a temporary or permanent pacemaker, skin irritation or burns and complications associated with conscious sedation including aspiration, arrhythmia, respiratory failure and death), benefits (restoration of normal sinus rhythm) and alternatives of a direct current cardioversion were explained in detail to Mr. Gaulin and he agrees to proceed.   Disposition: Keep your appointments with [facility] and Dr.  Marlou Porch  Medication Adjustments/Labs and Tests Ordered: Current medicines are reviewed at length with the patient today.  Concerns regarding medicines are outlined above.  Orders Placed This Encounter  Procedures   Basic Metabolic Panel (BMET)   CBC   EKG 12-Lead   Meds ordered this encounter  Medications   diltiazem (CARDIZEM CD) 240 MG 24 hr capsule    Sig: Take 1 capsule (240 mg total) by mouth daily.    Dispense:  90 capsule    Refill:  3   metoprolol tartrate (LOPRESSOR) 25 MG tablet    Sig: Take 1 tablet (25 mg total) by mouth 2 (two) times daily.    Dispense:  180 tablet    Refill:  3    Patient Instructions  Medication Instructions:   Your physician recommends that you continue on your current medications as directed. Please refer to the Current Medication list given to you today.   *If you need a refill on your cardiac medications before your next appointment, please call your pharmacy*   Lab Work:  TODAY!!!!! BMET/CBC  If you have labs (blood work) drawn today and your tests are completely normal, you will receive your results only by: [place] (if you have MyChart) OR A paper copy in the mail If you have any lab test that is abnormal or we need to change your treatment, we will call you to review the results.   Testing/Procedures:      Dear Stanley Juarez  You are scheduled for a Cardioversion on Monday, February 5 with Dr. Harrington Challenger.  Please arrive at the [facility] (Main Entrance A) at [facility]: [address] [place], [place] [address] at 10:00 AM.    DIET:  Nothing to eat or drink after midnight except a sip of water with medications (see medication instructions below)   MEDICATION INSTRUCTIONS: None Continue taking your anticoagulant (blood thinner): Rivaroxaban (Xarelto).  You will need to continue this  after your procedure until you are told by your provider that it is safe to stop.     LABS:     Labs will be on the day you see  Lorenda Peck back.    You must have a responsible person to drive you home and stay in the waiting area during your procedure. Failure to do so could result in cancellation.   Bring your insurance cards.   *Special Note: Every effort is made to have your procedure done on time. Occasionally there are emergencies that occur at the hospital that may cause delays. Please be patient if a delay does occur.      Follow-Up: At [facility], you and your health needs are our priority.  As part of our continuing mission to provide you with exceptional heart care, we have created designated Provider Care Teams.  These Care Teams include your primary Cardiologist (physician) and Advanced Practice Providers (APPs -  Physician Assistants and Nurse Practitioners) who all work together to provide you with the care you need, when you need it.  We recommend signing up for the patient portal called "MyChart".  Sign up information is provided on this After Visit Summary.  MyChart is used to connect with patients for Virtual Visits (Telemedicine).  Patients are able to view lab/test results, encounter notes, upcoming appointments, etc.  Non-urgent messages can be sent to your provider as well.   To learn more about what you can do with MyChart, go to [email or web address].    Your next appointment:   2 month(s)  Provider:   Candee Furbish, MD     Other Instructions  Keep your follow up with A-fib clinic in two weeks.     Signed, Emmaline Life, NP  09/26/2022 4:31 PM    [place]

## 2022-09-18 NOTE — H&P (View-Only) (Signed)
Cardiology Office Note:    Date:  09/26/2022   ID:  Stanley Juarez, DOB Dec 14, 1945, MRN [id number]  PCP:  Mayra Neer, MD   [facility] Providers Cardiologist:  Candee Furbish, MD     Referring MD: Mayra Neer, MD   Chief Complaint: Hospital follow-up  History of Present Illness:    Stanley Juarez is a very pleasant 77 y.o. male with a hx of DVT/PE on chronic anticoagulation, HTN, sleep apnea, right renal oncocytoma s/p nephrectomy, new onset atrial fibrillation, no additional cardiac history prior to admission.   Admission 1/2-08/30/22 for fever, cough, and shortness of breath, sent from urgent care for pneumonia. He tested positive for influenza A and CXR showed patchy airspace disease at L>R lung base, suspicious for pneumonia.  Found to be in atrial flutter/fib in the ED with HR in the 120s to 130s, hs troponin 15. Admitted for treatment of sepsis secondary to PNA, influenza A.  Has been maintained on Xarelto 20 mg daily for VTE. Cardiology consulted for atrial flutter/fib. On review of telemetry, appears in is in a fib with HR 100-120s. Echocardiogram on 08/28/22 revealed EF 55-60%, mild LVH, normal RV systolic function.  He reported he can occasionally feel a fluttering in his chest, especially when he tries to go to sleep.  Prior to admission he had felt this fluttering in his chest before but is difficult to say how often or when it first began.  He denied chest pain or shortness of breath at rest. Has been fatigued for about one month, no edema. Reported good compliance with CPAP for OSA. Continue Xarelto for stroke prevention, CHA2DS2-VASc score equals 2. TSH within normal limits.  Seen by me on 09/11/22 with his wife for hospital follow-up. Feeling stronger each day since hospital d/c. Is sleeping better.  Occasionally feels fluttering in his left shoulder.  Shortness of breath is improving.  Denied orthopnea, PND, edema, chest pain, presyncope, syncope. It has been a challenging  year for both of them and they are both very emotional today. Feel very grateful that the PE was found and that the growth on the right kidney was benign. Watching growth on left kidney - has CT next week. In atrial fibrillation with well controlled ventricular rate at 90 bpm. Not very symptomatic. Lengthy discussion about treatment options for A-fib including DCCV, AAD, continued rate control, and ablation. No atrial dilatation on recent echo, which is encouraging. He agreed to pursue cardioversion and possibly AAD or ablation in the future if cardioversion is unsuccessful. Will load him with amiodarone 400 mg twice daily and see him back in 2 weeks to plan for DCCV if still in a fib. No missed doses of Xarelto since at least 08/26/22.   Today, he is here with his wife for follow-up. Reports he did not tolerate amiodarone 2/2 constipation, flushing, and elevated BP. Has felt better since he stopped it. Continues to have fatigue, cannot do as much as he previously could. Does not have palpitations or feelings of his heart racing. Otherwise, feels that he is improving since hospitalization. He denies chest pain, shortness of breath, lower extremity edema, palpitations, melena, hematuria, hemoptysis, diaphoresis, weakness, presyncope, syncope, orthopnea, and PND. He is hopeful that he will be restored to sinus rhythm with cardioversion and is anxious to get it done.    Past Medical History:  Diagnosis Date   Arthritis    back   Cancer ([place])    Hypertension    Pneumonia  Sleep apnea     Past Surgical History:  Procedure Laterality Date   CATARACT EXTRACTION     IR ANGIOGRAM PULMONARY BILATERAL SELECTIVE  12/25/2021   IR INFUSION THROMBOL ARTERIAL INITIAL (MS)  12/25/2021   IR INFUSION THROMBOL ARTERIAL INITIAL (MS)  12/25/2021   IR RADIOLOGIST EVAL & MGMT  01/30/2022   IR THROMB F/U EVAL ART/VEN FINAL DAY (MS)  12/25/2021   IR US GUIDE VASC ACCESS RIGHT  12/25/2021   IR US GUIDE VASC ACCESS  RIGHT  12/25/2021   MENISCUS REPAIR Right    ROBOT ASSISTED LAPAROSCOPIC NEPHRECTOMY Right 05/09/2022   Procedure: XI ROBOTIC ASSISTED LAPAROSCOPIC NEPHRECTOMY;  Surgeon: Ceasar Mons, MD;  Location: [facility];  Service: Urology;  Laterality: Right;  ONLY NEEDS 180 MIN   ROTATOR CUFF REPAIR Right    skin cancer removal      Current Medications: Current Meds  Medication Sig   ARTIFICIAL TEAR SOLUTION OP Place 1 drop into both eyes 2 (two) times daily.   rivaroxaban (XARELTO) 20 MG TABS tablet Take 1 tablet (20 mg total) by mouth daily with supper.   senna-docusate (SENOKOT-S) 8.6-50 MG tablet Take 1 tablet by mouth daily as needed for mild constipation.   sodium chloride (NASAL MOIST) 0.65 % nasal spray Place 1 spray into the nose at bedtime.   traZODone (DESYREL) 100 MG tablet Take 100 mg by mouth at bedtime.   [DISCONTINUED] diltiazem (CARDIZEM CD) 240 MG 24 hr capsule Take 1 capsule (240 mg total) by mouth daily.   [DISCONTINUED] metoprolol tartrate (LOPRESSOR) 25 MG tablet Take 1 tablet (25 mg total) by mouth 2 (two) times daily.     Allergies:   Amiodarone, Prednisone, and Penicillins   Social History   Socioeconomic History   Marital status: Married    Spouse name: Not on file   Number of children: Not on file   Years of education: Not on file   Highest education level: Not on file  Occupational History   Not on file  Tobacco Use   Smoking status: Never   Smokeless tobacco: Never  Vaping Use   Vaping Use: Never used  Substance and Sexual Activity   Alcohol use: Never   Drug use: Never   Sexual activity: Not on file  Other Topics Concern   Not on file  Social History Narrative   Not on file   Social Determinants of Health   Financial Resource Strain: Not on file  Food Insecurity: No Food Insecurity (08/28/2022)   Hunger Vital Sign    Worried About Running Out of Food in the Last Year: Never true    Ran Out of Food in the Last Year: Never true   Transportation Needs: No Transportation Needs (08/28/2022)   PRAPARE - Hydrologist (Medical): No    Lack of Transportation (Non-Medical): No  Physical Activity: Not on file  Stress: Not on file  Social Connections: Not on file     Family History: The patient's family history is not on file.  ROS:   Please see the history of present illness.  + fatigue All other systems reviewed and are negative.  Labs/Other Studies Reviewed:    The following studies were reviewed today:  Echo Limited 08/28/22  1. Left ventricular ejection fraction, by estimation, is 55 to 60%. There  is mild concentric left ventricular hypertrophy.   2. Right ventricular systolic function is normal. The right ventricular  size is normal.   3.  The mitral valve is normal in structure. No evidence of mitral valve  regurgitation.   4. The aortic valve was not well visualized. Aortic valve regurgitation  is not visualized.   5. The inferior vena cava is normal in size with greater than 50%  respiratory variability, suggesting right atrial pressure of 3 mmHg.  Echo 04/09/22 1. Left ventricular ejection fraction, by estimation, is 60 to 65%. The  left ventricle has normal function. The left ventricle has no regional  wall motion abnormalities. Left ventricular diastolic parameters are  consistent with Grade II diastolic  dysfunction (pseudonormalization). The average left ventricular global  longitudinal strain is -23.4 %. The global longitudinal strain is normal.   2. Right ventricular systolic function is normal. The right ventricular  size is normal. Tricuspid regurgitation signal is inadequate for assessing  PA pressure.   3. Right atrial size was mildly dilated.   4. The mitral valve is normal in structure. Trivial mitral valve  regurgitation. No evidence of mitral stenosis.   5. The aortic valve is tricuspid. Aortic valve regurgitation is not  visualized. No aortic stenosis is  present.   6. Aortic dilatation noted. There is mild dilatation of the ascending  aorta, measuring 40 mm.   7. The inferior vena cava is normal in size with greater than 50%  respiratory variability, suggesting right atrial pressure of 3 mmHg.  Recent Labs: 12/26/2021: ALT 19 12/27/2021: B Natriuretic Peptide 72.3 08/28/2022: TSH 2.413 08/29/2022: BUN 26; Creatinine, Ser 1.68; Hemoglobin 13.0; Magnesium 1.9; Platelets 362; Potassium 4.1; Sodium 137  Recent Lipid Panel No results found for: "CHOL", "TRIG", "HDL", "CHOLHDL", "VLDL", "LDLCALC", "LDLDIRECT"   Risk Assessment/Calculations:    CHA2DS2-VASc Score = 2  This indicates a 2.2% annual risk of stroke. The patient's score is based upon: CHF History: 0 HTN History: 1 Diabetes History: 0 Stroke History: 0 Vascular Disease History: 0 Age Score: 1 Gender Score: 0    Physical Exam:    VS:  BP 126/70   Pulse 86   Ht '5\' 10"'$  (1.778 m)   Wt 238 lb 9.6 oz (108.2 kg)   SpO2 97%   BMI 34.24 kg/m     Wt Readings from Last 3 Encounters:  09/26/22 238 lb 9.6 oz (108.2 kg)  09/11/22 233 lb 3.2 oz (105.8 kg)  08/30/22 224 lb 6.9 oz (101.8 kg)     GEN:  Well nourished, well developed in no acute distress HEENT: Normal NECK: No JVD; No carotid bruits CARDIAC: Irregular RR, no murmurs, rubs, gallops RESPIRATORY:  Clear to auscultation without rales, wheezing or rhonchi  ABDOMEN: Soft, non-tender, non-distended MUSCULOSKELETAL:  No edema; No deformity. 2+ pedal pulses, equal bilaterally SKIN: Warm and dry NEUROLOGIC:  Alert and oriented x 3 PSYCHIATRIC:  Normal affect   EKG:  EKG is ordered today. EKG reveals atrial fibrillation with well-controlled ventricular rate at 86 bpm  Diagnoses:    1. Persistent atrial fibrillation ([place])   2. Primary hypertension   3. Atrial flutter by electrocardiogram ([place])   4. Secondary hypercoagulable state ([place])   5. Obstructive sleep apnea syndrome   6. History of pulmonary embolism   7. Stage  3b chronic kidney disease (CKD) ([place])   8. History of DVT (deep vein thrombosis)     Assessment and Plan:     Persistent atrial fibrillation on chronic anticoagulation: New onset a fib/flutter in the setting of pneumonia and flu hospitalization 08/26/22. Remains in atrial fibrillation today with well controlled ventricular rate at 86  bpm. Was loaded on amiodarone  400 mg twice daily but did not tolerate it. Symptomatic with fatigue. Reviewed treatment options again that were reviewed at length at last office visit.  Risks of cardioversion discussed and he would like to proceed.  Will follow-up with [facility] Fib clinic 1 week post DCCV. No missed doses of Xarelto since at least 08/26/22. Continue rate control with diltiazem and metoprolol. No bleeding concerns, continue Xarelto 20 mg daily which is appropriate dose for his CrCl.   History of DVT/PE: Submassive PE, RLE DVT 12/2021 felt to be 2/2 renal cancer. Was found to have oncocytoma, benign, not renal cancer. No concerns today. On chronic OAC, no missed doses since at least 08/26/22. Continue Xarelto 20 mg daily.  CKD: Scr 1.68, GFR 42 on lab work 08/29/21. Hx of renal oncocytoma s/p Rt nephrectomy 04/2022. Has a growth on left kidney that is being monitored.  We will recheck today.  Hypertension: BP is well controlled.   OSA on CPAP: Compliant. Checks apneic episode report frequently. Has appointment with sleep medicine in a few months.    Shared Decision Making/Informed Consent The risks (stroke, cardiac arrhythmias rarely resulting in the need for a temporary or permanent pacemaker, skin irritation or burns and complications associated with conscious sedation including aspiration, arrhythmia, respiratory failure and death), benefits (restoration of normal sinus rhythm) and alternatives of a direct current cardioversion were explained in detail to Mr. Westman and he agrees to proceed.   Disposition: Keep your appointments with [facility] and Dr.  Marlou Porch  Medication Adjustments/Labs and Tests Ordered: Current medicines are reviewed at length with the patient today.  Concerns regarding medicines are outlined above.  Orders Placed This Encounter  Procedures   Basic Metabolic Panel (BMET)   CBC   EKG 12-Lead   Meds ordered this encounter  Medications   diltiazem (CARDIZEM CD) 240 MG 24 hr capsule    Sig: Take 1 capsule (240 mg total) by mouth daily.    Dispense:  90 capsule    Refill:  3   metoprolol tartrate (LOPRESSOR) 25 MG tablet    Sig: Take 1 tablet (25 mg total) by mouth 2 (two) times daily.    Dispense:  180 tablet    Refill:  3    Patient Instructions  Medication Instructions:   Your physician recommends that you continue on your current medications as directed. Please refer to the Current Medication list given to you today.   *If you need a refill on your cardiac medications before your next appointment, please call your pharmacy*   Lab Work:  TODAY!!!!! BMET/CBC  If you have labs (blood work) drawn today and your tests are completely normal, you will receive your results only by: [place] (if you have MyChart) OR A paper copy in the mail If you have any lab test that is abnormal or we need to change your treatment, we will call you to review the results.   Testing/Procedures:      Dear [name]  You are scheduled for a Cardioversion on Monday, February 5 with Dr. Harrington Challenger.  Please arrive at the [facility] (Main Entrance A) at [facility]: [address] [place], [place] [address] at 10:00 AM.    DIET:  Nothing to eat or drink after midnight except a sip of water with medications (see medication instructions below)   MEDICATION INSTRUCTIONS: None Continue taking your anticoagulant (blood thinner): Rivaroxaban (Xarelto).  You will need to continue this  after your procedure until you are told by your provider that it is safe to stop.     LABS:     Labs will be on the day you see  Lorenda Peck back.    You must have a responsible person to drive you home and stay in the waiting area during your procedure. Failure to do so could result in cancellation.   Bring your insurance cards.   *Special Note: Every effort is made to have your procedure done on time. Occasionally there are emergencies that occur at the hospital that may cause delays. Please be patient if a delay does occur.      Follow-Up: At [facility], you and your health needs are our priority.  As part of our continuing mission to provide you with exceptional heart care, we have created designated Provider Care Teams.  These Care Teams include your primary Cardiologist (physician) and Advanced Practice Providers (APPs -  Physician Assistants and Nurse Practitioners) who all work together to provide you with the care you need, when you need it.  We recommend signing up for the patient portal called "MyChart".  Sign up information is provided on this After Visit Summary.  MyChart is used to connect with patients for Virtual Visits (Telemedicine).  Patients are able to view lab/test results, encounter notes, upcoming appointments, etc.  Non-urgent messages can be sent to your provider as well.   To learn more about what you can do with MyChart, go to [email or web address].    Your next appointment:   2 month(s)  Provider:   Candee Furbish, MD     Other Instructions  Keep your follow up with A-fib clinic in two weeks.     Signed, Emmaline Life, NP  09/26/2022 4:31 PM    [place]

## 2022-09-26 ENCOUNTER — Ambulatory Visit: Payer: Medicare HMO | Attending: Nurse Practitioner | Admitting: Nurse Practitioner

## 2022-09-26 ENCOUNTER — Encounter: Payer: Self-pay | Admitting: Nurse Practitioner

## 2022-09-26 ENCOUNTER — Other Ambulatory Visit: Payer: Self-pay | Admitting: Urology

## 2022-09-26 VITALS — BP 126/70 | HR 86 | Ht 70.0 in | Wt 238.6 lb

## 2022-09-26 DIAGNOSIS — I4892 Unspecified atrial flutter: Secondary | ICD-10-CM | POA: Diagnosis not present

## 2022-09-26 DIAGNOSIS — G4733 Obstructive sleep apnea (adult) (pediatric): Secondary | ICD-10-CM | POA: Diagnosis not present

## 2022-09-26 DIAGNOSIS — I4819 Other persistent atrial fibrillation: Secondary | ICD-10-CM | POA: Diagnosis not present

## 2022-09-26 DIAGNOSIS — Z86711 Personal history of pulmonary embolism: Secondary | ICD-10-CM | POA: Diagnosis not present

## 2022-09-26 DIAGNOSIS — D6869 Other thrombophilia: Secondary | ICD-10-CM | POA: Diagnosis not present

## 2022-09-26 DIAGNOSIS — D3000 Benign neoplasm of unspecified kidney: Secondary | ICD-10-CM

## 2022-09-26 DIAGNOSIS — I10 Essential (primary) hypertension: Secondary | ICD-10-CM | POA: Diagnosis not present

## 2022-09-26 DIAGNOSIS — N1832 Chronic kidney disease, stage 3b: Secondary | ICD-10-CM | POA: Diagnosis not present

## 2022-09-26 DIAGNOSIS — Z86718 Personal history of other venous thrombosis and embolism: Secondary | ICD-10-CM | POA: Diagnosis not present

## 2022-09-26 MED ORDER — METOPROLOL TARTRATE 25 MG PO TABS: 25.0000 mg | ORAL_TABLET | Freq: Two times a day (BID) | ORAL | 3 refills | Status: DC

## 2022-09-26 MED ORDER — DILTIAZEM HCL ER COATED BEADS 240 MG PO CP24: 240.0000 mg | ORAL_CAPSULE | Freq: Every day | ORAL | 3 refills | Status: DC

## 2022-09-26 NOTE — Patient Instructions (Signed)
Medication Instructions:   Your physician recommends that you continue on your current medications as directed. Please refer to the Current Medication list given to you today.   *If you need a refill on your cardiac medications before your next appointment, please call your pharmacy*   Lab Work:  TODAY!!!!! BMET/CBC  If you have labs (blood work) drawn today and your tests are completely normal, you will receive your results only by: [place] (if you have MyChart) OR A paper copy in the mail If you have any lab test that is abnormal or we need to change your treatment, we will call you to review the results.   Testing/Procedures:      Dear Stanley Juarez  You are scheduled for a Cardioversion on Monday, February 5 with Dr. Harrington Challenger.  Please arrive at the [facility] (Main Entrance A) at [facility]: [address] [place], [place] [address] at 10:00 AM.    DIET:  Nothing to eat or drink after midnight except a sip of water with medications (see medication instructions below)   MEDICATION INSTRUCTIONS: None Continue taking your anticoagulant (blood thinner): Rivaroxaban (Xarelto).  You will need to continue this after your procedure until you are told by your provider that it is safe to stop.     LABS:     Labs will be on the day you see Lorenda Peck back.    You must have a responsible person to drive you home and stay in the waiting area during your procedure. Failure to do so could result in cancellation.   Bring your insurance cards.   *Special Note: Every effort is made to have your procedure done on time. Occasionally there are emergencies that occur at the hospital that may cause delays. Please be patient if a delay does occur.      Follow-Up: At [facility], you and your health needs are our priority.  As part of our continuing mission to provide you with exceptional heart care, we have created designated Provider Care Teams.  These  Care Teams include your primary Cardiologist (physician) and Advanced Practice Providers (APPs -  Physician Assistants and Nurse Practitioners) who all work together to provide you with the care you need, when you need it.  We recommend signing up for the patient portal called "MyChart".  Sign up information is provided on this After Visit Summary.  MyChart is used to connect with patients for Virtual Visits (Telemedicine).  Patients are able to view lab/test results, encounter notes, upcoming appointments, etc.  Non-urgent messages can be sent to your provider as well.   To learn more about what you can do with MyChart, go to [email or web address].    Your next appointment:   2 month(s)  Provider:   Candee Furbish, MD     Other Instructions  Keep your follow up with A-fib clinic in two weeks.

## 2022-09-27 LAB — BASIC METABOLIC PANEL
BUN/Creatinine Ratio: 18 (ref 10–24)
BUN: 30 mg/dL — ABNORMAL HIGH (ref 8–27)
CO2: 20 mmol/L (ref 20–29)
Calcium: 8.9 mg/dL (ref 8.6–10.2)
Chloride: 107 mmol/L — ABNORMAL HIGH (ref 96–106)
Creatinine, Ser: 1.68 mg/dL — ABNORMAL HIGH (ref 0.76–1.27)
Glucose: 85 mg/dL (ref 70–99)
Potassium: 4.6 mmol/L (ref 3.5–5.2)
Sodium: 140 mmol/L (ref 134–144)
eGFR: 42 mL/min/{1.73_m2} — ABNORMAL LOW (ref >59)

## 2022-09-27 LAB — CBC
Hematocrit: 44.1 % (ref 37.5–51.0)
Hemoglobin: 14.8 g/dL (ref 13.0–17.7)
MCH: 30.4 pg (ref 26.6–33.0)
MCHC: 33.6 g/dL (ref 31.5–35.7)
MCV: 91 fL (ref 79–97)
Platelets: 183 10*3/uL (ref 150–450)
RBC: 4.87 x10E6/uL (ref 4.14–5.80)
RDW: 14 % (ref 11.6–15.4)
WBC: 6.5 10*3/uL (ref 3.4–10.8)

## 2022-09-29 ENCOUNTER — Ambulatory Visit (HOSPITAL_BASED_OUTPATIENT_CLINIC_OR_DEPARTMENT_OTHER): Payer: Medicare HMO | Admitting: Anesthesiology

## 2022-09-29 ENCOUNTER — Ambulatory Visit (HOSPITAL_COMMUNITY)
Admission: RE | Admit: 2022-09-29 | Discharge: 2022-09-29 | Disposition: A | Discharge status: Home / Self Care | Payer: Medicare HMO | Source: Ambulatory Visit | Attending: Internal Medicine | Admitting: Internal Medicine

## 2022-09-29 ENCOUNTER — Other Ambulatory Visit: Payer: Self-pay

## 2022-09-29 ENCOUNTER — Encounter (HOSPITAL_COMMUNITY): Payer: Self-pay | Admitting: Internal Medicine

## 2022-09-29 ENCOUNTER — Ambulatory Visit (HOSPITAL_COMMUNITY): Payer: Medicare HMO | Admitting: Anesthesiology

## 2022-09-29 ENCOUNTER — Other Ambulatory Visit: Payer: Self-pay | Admitting: Urology

## 2022-09-29 ENCOUNTER — Encounter (HOSPITAL_COMMUNITY)
Admission: RE | Disposition: A | Discharge status: Home / Self Care | Payer: Self-pay | Source: Ambulatory Visit | Attending: Internal Medicine

## 2022-09-29 DIAGNOSIS — M1990 Unspecified osteoarthritis, unspecified site: Secondary | ICD-10-CM | POA: Diagnosis not present

## 2022-09-29 DIAGNOSIS — Z86718 Personal history of other venous thrombosis and embolism: Secondary | ICD-10-CM | POA: Insufficient documentation

## 2022-09-29 DIAGNOSIS — Z7901 Long term (current) use of anticoagulants: Secondary | ICD-10-CM | POA: Diagnosis not present

## 2022-09-29 DIAGNOSIS — I4891 Unspecified atrial fibrillation: Secondary | ICD-10-CM

## 2022-09-29 DIAGNOSIS — Z905 Acquired absence of kidney: Secondary | ICD-10-CM | POA: Insufficient documentation

## 2022-09-29 DIAGNOSIS — I129 Hypertensive chronic kidney disease with stage 1 through stage 4 chronic kidney disease, or unspecified chronic kidney disease: Secondary | ICD-10-CM | POA: Diagnosis not present

## 2022-09-29 DIAGNOSIS — Z86711 Personal history of pulmonary embolism: Secondary | ICD-10-CM | POA: Insufficient documentation

## 2022-09-29 DIAGNOSIS — N1832 Chronic kidney disease, stage 3b: Secondary | ICD-10-CM | POA: Insufficient documentation

## 2022-09-29 DIAGNOSIS — G4730 Sleep apnea, unspecified: Secondary | ICD-10-CM

## 2022-09-29 DIAGNOSIS — I10 Essential (primary) hypertension: Secondary | ICD-10-CM | POA: Diagnosis not present

## 2022-09-29 DIAGNOSIS — Z79899 Other long term (current) drug therapy: Secondary | ICD-10-CM | POA: Insufficient documentation

## 2022-09-29 DIAGNOSIS — Z09 Encounter for follow-up examination after completed treatment for conditions other than malignant neoplasm: Secondary | ICD-10-CM | POA: Diagnosis not present

## 2022-09-29 DIAGNOSIS — I4819 Other persistent atrial fibrillation: Secondary | ICD-10-CM

## 2022-09-29 DIAGNOSIS — G4733 Obstructive sleep apnea (adult) (pediatric): Secondary | ICD-10-CM | POA: Insufficient documentation

## 2022-09-29 DIAGNOSIS — N289 Disorder of kidney and ureter, unspecified: Secondary | ICD-10-CM | POA: Diagnosis not present

## 2022-09-29 DIAGNOSIS — Z86018 Personal history of other benign neoplasm: Secondary | ICD-10-CM | POA: Diagnosis not present

## 2022-09-29 DIAGNOSIS — N2889 Other specified disorders of kidney and ureter: Secondary | ICD-10-CM

## 2022-09-29 HISTORY — PX: CARDIOVERSION: SHX1299

## 2022-09-29 LAB — POCT I-STAT, CHEM 8
BUN: 26 mg/dL — ABNORMAL HIGH (ref 8–23)
Calcium, Ion: 1.26 mmol/L (ref 1.15–1.40)
Chloride: 107 mmol/L (ref 98–111)
Creatinine, Ser: 1.7 mg/dL — ABNORMAL HIGH (ref 0.61–1.24)
Glucose, Bld: 106 mg/dL — ABNORMAL HIGH (ref 70–99)
HCT: 46 % (ref 39.0–52.0)
Hemoglobin: 15.6 g/dL (ref 13.0–17.0)
Potassium: 4.5 mmol/L (ref 3.5–5.1)
Sodium: 141 mmol/L (ref 135–145)
TCO2: 23 mmol/L (ref 22–32)

## 2022-09-29 SURGERY — CARDIOVERSION
Anesthesia: General

## 2022-09-29 MED ORDER — LIDOCAINE 2% (20 MG/ML) 5 ML SYRINGE
INTRAMUSCULAR | Status: DC | PRN
  Administered 2022-09-29: 60 mg via INTRAVENOUS

## 2022-09-29 MED ORDER — SODIUM CHLORIDE 0.9 % IV SOLN: INTRAVENOUS | Status: DC

## 2022-09-29 MED ORDER — PROPOFOL 10 MG/ML IV BOLUS
INTRAVENOUS | Status: DC | PRN
  Administered 2022-09-29: 60 mg via INTRAVENOUS

## 2022-09-29 NOTE — Discharge Instructions (Signed)

## 2022-09-29 NOTE — CV Procedure (Signed)
CARDIOVERSION  INdication:  Atrial fibrillation  Patient sedated by anesthesia with 60 mg propofol, 60 mg lidocaine intravenously  With pads in the AP position, patient cardioverted to SR with 200 J synchronized biphasic energy  Procedure was without complication  12 lead EKG pending  Dorris Carnes MD

## 2022-09-29 NOTE — Anesthesia Preprocedure Evaluation (Signed)
Anesthesia Evaluation  Patient identified by MRN, date of birth, ID band Patient awake    Reviewed: Allergy & Precautions, H&P , NPO status , Patient's Chart, lab work & pertinent test results  Airway Mallampati: II   Neck ROM: full    Dental   Pulmonary sleep apnea    breath sounds clear to auscultation       Cardiovascular hypertension, + dysrhythmias Atrial Fibrillation  Rhythm:irregular Rate:Normal     Neuro/Psych    GI/Hepatic   Endo/Other    Renal/GU Renal InsufficiencyRenal disease     Musculoskeletal  (+) Arthritis ,    Abdominal   Peds  Hematology   Anesthesia Other Findings   Reproductive/Obstetrics                             Anesthesia Physical Anesthesia Plan  ASA: 3  Anesthesia Plan: General   Post-op Pain Management:    Induction: Intravenous  PONV Risk Score and Plan: 2 and Propofol infusion and Treatment may vary due to age or medical condition  Airway Management Planned: Mask  Additional Equipment:   Intra-op Plan:   Post-operative Plan:   Informed Consent: I have reviewed the patients History and Physical, chart, labs and discussed the procedure including the risks, benefits and alternatives for the proposed anesthesia with the patient or authorized representative who has indicated his/her understanding and acceptance.     Dental advisory given  Plan Discussed with: CRNA, Anesthesiologist and Surgeon  Anesthesia Plan Comments:        Anesthesia Quick Evaluation

## 2022-09-29 NOTE — Transfer of Care (Signed)
Immediate Anesthesia Transfer of Care Note  Patient: Stanley Juarez  Procedure(s) Performed: CARDIOVERSION  Patient Location: PACU and Endoscopy Unit  Anesthesia Type:General  Level of Consciousness: oriented and drowsy  Airway & Oxygen Therapy: Patient Spontanous Breathing  Post-op Assessment: Report given to RN  Post vital signs: Reviewed and stable  Last Vitals:  Vitals Value Taken Time  BP 120/78   Temp    Pulse 69   Resp 20   SpO2 96     Last Pain:  Vitals:   09/29/22 0936  TempSrc: Tympanic  PainSc: 0-No pain         Complications: No notable events documented.

## 2022-09-29 NOTE — Interval H&P Note (Signed)
History and Physical Interval Note:  09/29/2022 10:06 AM  Stanley Juarez  has presented today for surgery, with the diagnosis of AFIB.  The various methods of treatment have been discussed with the patient and family. After consideration of risks, benefits and other options for treatment, the patient has consented to  Procedure(s): CARDIOVERSION (N/A) as a surgical intervention.  The patient's history has been reviewed, patient examined, no change in status, stable for surgery.  I have reviewed the patient's chart and labs.  Questions were answered to the patient's satisfaction.     Dorris Carnes

## 2022-09-30 NOTE — Anesthesia Postprocedure Evaluation (Signed)
Anesthesia Post Note  Patient: Stanley Juarez  Procedure(s) Performed: CARDIOVERSION     Patient location during evaluation: Endoscopy Anesthesia Type: General Level of consciousness: awake and alert Pain management: pain level controlled Vital Signs Assessment: post-procedure vital signs reviewed and stable Respiratory status: spontaneous breathing, nonlabored ventilation, respiratory function stable and patient connected to nasal cannula oxygen Cardiovascular status: blood pressure returned to baseline and stable Postop Assessment: no apparent nausea or vomiting Anesthetic complications: no   No notable events documented.  Last Vitals:  Vitals:   09/29/22 1030 09/29/22 1040  BP: 106/72 122/85  Pulse: 67 67  Resp: 14 17  Temp:    SpO2: 95% 95%    Last Pain:  Vitals:   09/29/22 1040  TempSrc:   PainSc: 0-No pain                 [name] S

## 2022-10-02 ENCOUNTER — Encounter (HOSPITAL_COMMUNITY): Payer: Self-pay | Admitting: *Deleted

## 2022-10-02 ENCOUNTER — Ambulatory Visit (HOSPITAL_COMMUNITY): Payer: Medicare HMO | Admitting: Nurse Practitioner

## 2022-10-02 ENCOUNTER — Ambulatory Visit
Admission: RE | Admit: 2022-10-02 | Discharge: 2022-10-02 | Disposition: A | Discharge status: Home / Self Care | Payer: Medicare HMO | Source: Ambulatory Visit | Attending: Urology | Admitting: Urology

## 2022-10-02 ENCOUNTER — Telehealth (HOSPITAL_COMMUNITY): Payer: Self-pay | Admitting: *Deleted

## 2022-10-02 DIAGNOSIS — N2889 Other specified disorders of kidney and ureter: Secondary | ICD-10-CM | POA: Diagnosis not present

## 2022-10-02 NOTE — Telephone Encounter (Signed)
Patient with diagnosis of aflutter and DVT/PE on Xarelto for anticoagulation.    Procedure: renal biopsy Date of procedure: TBD  CHA2DS2-VASc Score = 3  This indicates a 3.2% annual risk of stroke. The patient's score is based upon: CHF History: 0 HTN History: 1 Diabetes History: 0 Stroke History: 0 Vascular Disease History: 0 Age Score: 2 Gender Score: 0   Initially on Xarelto for submassive PE, RLE DVT 12/2021 felt to be 2/2 renal cancer. Afib dx in the setting of pneumonia and flu 08/26/22. Underwent cardioversion 09/29/22 and can't hold anticoag for 4 weeks after.  CrCl 30m/min using adjusted body weight Platelet count 183K  Per office protocol, patient can hold Xarelto for 2 days prior to procedure, however the earliest date that procedure can be scheduled on is March 7th so that he can complete 4 weeks of uninterrupted anticoag after his recent cardioversion a few days ago.    **This guidance is not considered finalized until pre-operative APP has relayed final recommendations.**

## 2022-10-02 NOTE — Telephone Encounter (Signed)
Received call from [facility] requesting permission to hold Xarelto for 2 days for renal biopsy.  Will forward to pre-op anticoag pool. Informed rep with recent cardioversion would need 4 weeks of uninterrupted xarelto therapy before pt could hold. States procedure is not yet scheduled.

## 2022-10-02 NOTE — Consult Note (Signed)
Chief Complaint: Patient was seen in consultation today for a left renal mass at the request of Winter,Christopher Marjory Lies  Referring Physician(s): Winter,Christopher Marjory Lies  History of Present Illness: JUANA MONTINI is a 77 y.o. male known to our service after prior bilateral catheter directed thrombolytic infusion therapy for submassive bilateral acute pulmonary embolism on 12/24/2021.  At that time he also had fairly extensive right lower extremity DVT.  He did well following catheter directed thrombolytic therapy and anticoagulation with no significant sequela afterwards.  Follow-up duplex ultrasound revealed only some residual right popliteal vein DVT.  He does have some chronic edema of the lower extremities, right greater than left, for which he wears compression stockings.    At the time of pulmonary embolism, CT revealed an incidental asymptomatic 7 cm exophytic mass emanating from the lower pole of the right kidney.  This was resected by Dr. 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  He did quite well following surgery and has made a full recovery.  Imaging also revealed a contralateral enhancing mass of the mid left kidney estimated to measure approximately 2.5 cm in maximum diameter.  This was followed by CT on 09/15/2022 with the lesion demonstrating slight growth.  The mass is asymptomatic and the patient denies pain or hematuria.  He was recently hospitalized in early January for pneumonia, influenza and sepsis and was also noted to be in atrial fibrillation with rapid ventricular response.  After pharmacologic rate control and anticoagulation, he recently underwent cardioversion by Dr. Harrington Challenger on 09/29/2022 which was successful in converting him into sinus rhythm.  He remains on Xarelto 20 mg daily.  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  Past Medical History:  Diagnosis Date   Arthritis    back   Cancer ([place])    Hypertension    Pneumonia    Sleep apnea     Past Surgical History:  Procedure Laterality Date   CATARACT EXTRACTION     IR ANGIOGRAM PULMONARY BILATERAL SELECTIVE  12/25/2021   IR INFUSION THROMBOL ARTERIAL INITIAL (MS)  12/25/2021   IR INFUSION THROMBOL ARTERIAL INITIAL (MS)  12/25/2021   IR RADIOLOGIST EVAL & MGMT  01/30/2022   IR THROMB F/U EVAL ART/VEN FINAL DAY (MS)  12/25/2021   IR US GUIDE VASC ACCESS RIGHT  12/25/2021   IR US GUIDE VASC ACCESS RIGHT  12/25/2021   MENISCUS REPAIR Right    ROBOT ASSISTED LAPAROSCOPIC NEPHRECTOMY Right 05/09/2022   Procedure: XI ROBOTIC ASSISTED LAPAROSCOPIC NEPHRECTOMY;  Surgeon: Ceasar Mons, MD;  Location: [facility];  Service: Urology;  Laterality: Right;  ONLY NEEDS 180 MIN   ROTATOR CUFF REPAIR Right    skin cancer removal      Allergies: Amiodarone, Prednisone, and Penicillins  Medications: Prior to Admission medications   Medication Sig Start Date End Date Taking? Authorizing Provider  ARTIFICIAL TEAR SOLUTION OP Place 1 drop into both eyes 2 (two) times daily.    [provider]  diltiazem (CARDIZEM CD) 240 MG 24 hr capsule Take 1 capsule (240 mg total) by mouth daily. 09/26/22 07/27/23  Swinyer, Lanice Schwab, NP  metoprolol tartrate (LOPRESSOR) 25 MG tablet Take 1 tablet (25 mg total) by mouth 2 (two) times daily. 09/26/22 09/27/23  Swinyer, Lanice Schwab, NP  rivaroxaban (XARELTO) 20 MG TABS tablet Take 1 tablet (20 mg total) by mouth  daily with supper. 01/18/22   Charlynne Cousins, MD  senna-docusate (SENOKOT-S) 8.6-50 MG tablet Take 1 tablet by mouth daily as needed for mild constipation.    [provider]  sodium chloride (NASAL MOIST) 0.65 % nasal spray Place 1 spray into the nose at bedtime.    [provider]  traZODone (DESYREL) 100 MG tablet Take 100 mg by mouth at bedtime. 12/03/21   [provider]      No family history on file.  Social History   Socioeconomic History   Marital status: Married    Spouse name: Not on file   Number of children: Not on file   Years of education: Not on file   Highest education level: Not on file  Occupational History   Not on file  Tobacco Use   Smoking status: Never   Smokeless tobacco: Never  Vaping Use   Vaping Use: Never used  Substance and Sexual Activity   Alcohol use: Never   Drug use: Never   Sexual activity: Not on file  Other Topics Concern   Not on file  Social History Narrative   Not on file   Social Determinants of Health   Financial Resource Strain: Not on file  Food Insecurity: No Food Insecurity (08/28/2022)   Hunger Vital Sign    Worried About Running Out of Food in the Last Year: Never true    Ran Out of Food in the Last Year: Never true  Transportation Needs: No Transportation Needs (08/28/2022)   PRAPARE - Hydrologist (Medical): No    Lack of Transportation (Non-Medical): No  Physical Activity: Not on file  Stress: Not on file  Social Connections: Not on file    ECOG Status: 0 - Asymptomatic  Review of Systems: A 12 point ROS discussed and pertinent positives are indicated in the HPI above.  All other systems are negative.  Review of Systems  Constitutional: Negative.   Respiratory: Negative.    Cardiovascular:  Positive for leg swelling. Negative for chest pain and palpitations.  Gastrointestinal: Negative.   Genitourinary: Negative.   Musculoskeletal: Negative.   Neurological: Negative.     Vital Signs: BP (!) 143/74   Pulse 76   Temp 97.9 F (36.6 C) (Oral)   Wt 108 kg   SpO2 98% Comment: room air  BMI 34.15 kg/m    Physical Exam Vitals reviewed.  Constitutional:      General: He is not in acute distress.    Appearance: Normal appearance. He is not ill-appearing, toxic-appearing or diaphoretic.  HENT:     Head: Normocephalic and atraumatic.   Cardiovascular:     Rate and Rhythm: Normal rate and regular rhythm.     Pulses: Normal pulses.     Heart sounds: Normal heart sounds. No murmur heard.    No friction rub. No gallop.  Pulmonary:     Effort: Pulmonary effort is normal. No respiratory distress.     Breath sounds: Normal breath sounds. No stridor. No wheezing, rhonchi or rales.  Abdominal:     General: There is no distension.     Palpations: Abdomen is soft. There is no mass.     Tenderness: There is no abdominal tenderness. There is no right CVA tenderness, left CVA tenderness, guarding or rebound.     Hernia: No hernia is present.  Musculoskeletal:     Comments: Bilateral leg compression stockings. No significant edema with stockings on.  Skin:  General: Skin is warm and dry.  Neurological:     General: No focal deficit present.     Mental Status: He is alert and oriented to person, place, and time.     Imaging: No results found.  Labs:  CBC: Recent Labs    08/27/22 0420 08/28/22 0258 08/29/22 0505 09/26/22 1145 09/29/22 0943  WBC 9.2 7.6 7.3 6.5  --   HGB 12.2* 13.1 13.0 14.8 15.6  HCT 37.0* 40.4 40.0 44.1 46.0  PLT 231 291 362 183  --     COAGS: Recent Labs    12/25/21 0256  INR 1.2  APTT 90*    BMP: Recent Labs    08/26/22 1750 08/27/22 0420 08/28/22 0258 08/29/22 0505 09/26/22 1145 09/29/22 0943  NA 134* 134* 137 137 140 141  K 5.0 4.1 4.5 4.1 4.6 4.5  CL 102 104 108 107 107* 107  CO2 21* 22 21* 21* 20  --   GLUCOSE 159* 105* 114* 119* 85 106*  BUN 25* 19 18 26* 30* 26*  CALCIUM 8.5* 7.8* 8.6* 8.3* 8.9  --   CREATININE 1.83* 1.75* 1.65* 1.68* 1.68* 1.70*  GFRNONAA 38* 40* 43* 42*  --   --     LIVER FUNCTION TESTS: Recent Labs    12/24/21 1325 12/26/21 0308 12/26/21 1036  BILITOT 1.4* 0.9 0.7  AST '31 20 17  '$ ALT '29 21 19  '$ ALKPHOS 65 63 60  PROT 7.5 6.3* 6.2*  ALBUMIN 4.0 3.4* 3.2*    Assessment and Plan:  I met with Mr. Naas and his wife.  I reviewed imaging  findings with them with regard to the left renal mass.  On the prior CTA dated 12/24/2021 the mass measures approximately 2.2 x 2.6 cm on axial images by my measurements.  On the follow-up CT on 09/15/2022 it measures approximately 2.6 x 3.0 cm demonstrating roughly 4 mm in growth in both dimensions.  The mass has fairly uniform enhancement and has the appearance of a low-grade lesion.  No evidence of metastatic disease.  Given pathology of a large oncocytoma in the contralateral right kidney which has now been removed, this very well could represent another oncocytoma in the left kidney.  However, a low-grade carcinoma such as a papillary carcinoma certainly is a possibility.  I reviewed options for further treatment with Mr. Gamel and his wife including continued surveillance, percutaneous biopsy and percutaneous cryoablation.  The lesion is of size and in a location amenable to percutaneous cryoablation.  However, given his solitary kidney and some degree of renal insufficiency after right nephrectomy, any treatment of the left kidney would be at risk of worsening renal insufficiency.  Given that this has shown fairly slow growth, I recommended that we first pursue percutaneous biopsy in order to determine whether the mass is benign or malignant.  If this is another oncocytoma, a more conservative surveillance approach may be feasible with cryoablation reserved for either more significant future growth or development of symptoms.  If this turns out to be a carcinoma, cryoablation could be considered earlier.  Given his recent cardioversion for atrial fibrillation, we will check with Cardiology regarding whether he can come off of Xarelto for 2 days for renal biopsy.  If we have to wait several weeks or up to a few months to perform renal biopsy, I told Mr. Raquel Sarna that that would be fine given that this is a very slow-growing lesion.  The procedure will be performed on an outpatient basis under  ultrasound guidance  at either [facility] or [facility].  Thank you for this interesting consult.  I greatly enjoyed meeting MANLEY FASON and look forward to participating in their care.  A copy of this report was sent to the requesting provider on this date.  Electronically Signed: Azzie Roup 10/02/2022, 9:19 AM    I spent a total of  40 Minutes  in face to face in clinical consultation, greater than 50% of which was counseling/coordinating care for a left renal mass.

## 2022-10-02 NOTE — Telephone Encounter (Signed)
I called [facility] and was put into a vm for Jocelyn Lamer, Training and development officer. I left a message that we are going to need to have clearance request faxed to [phone] attn: pre op team in order for the pre op APP to review.   Left my direct # as well [phone].

## 2022-10-03 ENCOUNTER — Encounter: Payer: Self-pay | Admitting: Interventional Radiology

## 2022-10-03 ENCOUNTER — Encounter (HOSPITAL_COMMUNITY): Payer: Self-pay | Admitting: Internal Medicine

## 2022-10-03 DIAGNOSIS — N2889 Other specified disorders of kidney and ureter: Secondary | ICD-10-CM

## 2022-10-03 NOTE — Telephone Encounter (Signed)
   Pre-operative Risk Assessment    Patient Name: Stanley Juarez  DOB: 01-13-1946 MRN: [id number]      Request for Surgical Clearance    Procedure:   LEFT RENAL MASS Bx  Date of Surgery:  Clearance TBD                                 Surgeon:  DR. Kathlene Cote Surgeon's Group or Practice Name:  [facility] Phone number:  [phone] Fax number:  [phone]   Type of Clearance Requested:   - Medical  - Pharmacy:  Hold Rivaroxaban (Xarelto) x 2 DAYS PRIOR   Type of Anesthesia:  Not Indicated   Additional requests/questions:    Jiles Prows   10/03/2022, 9:14 AM

## 2022-10-06 ENCOUNTER — Ambulatory Visit: Payer: Medicare HMO | Admitting: Cardiology

## 2022-10-06 ENCOUNTER — Encounter (HOSPITAL_COMMUNITY): Payer: Self-pay | Admitting: Physician Assistant

## 2022-10-06 ENCOUNTER — Ambulatory Visit (HOSPITAL_COMMUNITY)
Admission: RE | Admit: 2022-10-06 | Discharge: 2022-10-06 | Disposition: A | Discharge status: Home / Self Care | Payer: Medicare HMO | Source: Ambulatory Visit | Attending: Nurse Practitioner | Admitting: Nurse Practitioner

## 2022-10-06 VITALS — BP 140/80 | HR 60 | Ht 70.0 in | Wt 239.8 lb

## 2022-10-06 DIAGNOSIS — I10 Essential (primary) hypertension: Secondary | ICD-10-CM | POA: Insufficient documentation

## 2022-10-06 DIAGNOSIS — Z79899 Other long term (current) drug therapy: Secondary | ICD-10-CM | POA: Diagnosis not present

## 2022-10-06 DIAGNOSIS — I4819 Other persistent atrial fibrillation: Secondary | ICD-10-CM

## 2022-10-06 DIAGNOSIS — Z6834 Body mass index (BMI) 34.0-34.9, adult: Secondary | ICD-10-CM | POA: Insufficient documentation

## 2022-10-06 DIAGNOSIS — Z86718 Personal history of other venous thrombosis and embolism: Secondary | ICD-10-CM | POA: Diagnosis not present

## 2022-10-06 DIAGNOSIS — E669 Obesity, unspecified: Secondary | ICD-10-CM | POA: Insufficient documentation

## 2022-10-06 DIAGNOSIS — Z7901 Long term (current) use of anticoagulants: Secondary | ICD-10-CM | POA: Diagnosis not present

## 2022-10-06 DIAGNOSIS — Z86711 Personal history of pulmonary embolism: Secondary | ICD-10-CM | POA: Diagnosis not present

## 2022-10-06 DIAGNOSIS — G4733 Obstructive sleep apnea (adult) (pediatric): Secondary | ICD-10-CM | POA: Diagnosis not present

## 2022-10-06 DIAGNOSIS — D6869 Other thrombophilia: Secondary | ICD-10-CM | POA: Diagnosis not present

## 2022-10-06 NOTE — Progress Notes (Signed)
Primary Care Physician: Mayra Neer, MD Primary Cardiologist: Dr Marlou Porch Primary Electrophysiologist: none Referring Physician: Christen Bame NP   Stanley Juarez is a 77 y.o. male with a history of DVT/PE, HTN, OSA, right renal oncocytoma s/p nephrectomy, atrial fibrillation who presents for consultation in the [facility].  Admission 1/2-08/30/22 for fever, cough, and shortness of breath, sent from urgent care for pneumonia. He tested positive for influenza A and CXR showed patchy airspace disease at L>R lung base, suspicious for pneumonia. Found to be in atrial flutter/fib in the ED with HR in the 120s to 130s, hs troponin 15. Admitted for treatment of sepsis secondary to PNA, influenza A. Patient is on Xarelto for a CHADS2VASC score of 3. He was discharged in afib with plan for outpatient DCCV. He was started on amiodarone as an outpatient but did not tolerate this medication. He is s/p DCCV on 09/29/22.  Today, patient remains in [place]. He does feel that his stamina has increased since DCCV. He denies any bleeding issues on anticoagulation.   Today, he denies symptoms of palpitations, chest pain, shortness of breath, orthopnea, PND, lower extremity edema, dizziness, presyncope, syncope, snoring, daytime somnolence, bleeding, or neurologic sequela. The patient is tolerating medications without difficulties and is otherwise without complaint today.    Atrial Fibrillation Risk Factors:  he does have symptoms or diagnosis of sleep apnea. he is compliant with CPAP therapy. he does not have a history of rheumatic fever.   he has a BMI of Body mass index is 34.41 kg/m.Marland Kitchen Filed Weights   10/06/22 1039  Weight: 108.8 kg    No family history on file.   Atrial Fibrillation Management history:  Previous antiarrhythmic drugs: amiodarone  Previous cardioversions: 09/29/22 Previous ablations: none CHADS2VASC score: 3 Anticoagulation history: Xarelto   Past  Medical History:  Diagnosis Date   Arthritis    back   Cancer ([place])    Hypertension    Pneumonia    Sleep apnea    Past Surgical History:  Procedure Laterality Date   CARDIOVERSION N/A 09/29/2022   Procedure: CARDIOVERSION;  Surgeon: Fay Records, MD;  Location: [facility];  Service: Cardiovascular;  Laterality: N/A;   CATARACT EXTRACTION     IR ANGIOGRAM PULMONARY BILATERAL SELECTIVE  12/25/2021   IR INFUSION THROMBOL ARTERIAL INITIAL (MS)  12/25/2021   IR INFUSION THROMBOL ARTERIAL INITIAL (MS)  12/25/2021   IR RADIOLOGIST EVAL & MGMT  01/30/2022   IR THROMB F/U EVAL ART/VEN FINAL DAY (MS)  12/25/2021   IR US GUIDE VASC ACCESS RIGHT  12/25/2021   IR US GUIDE VASC ACCESS RIGHT  12/25/2021   MENISCUS REPAIR Right    ROBOT ASSISTED LAPAROSCOPIC NEPHRECTOMY Right 05/09/2022   Procedure: XI ROBOTIC ASSISTED LAPAROSCOPIC NEPHRECTOMY;  Surgeon: Ceasar Mons, MD;  Location: [facility];  Service: Urology;  Laterality: Right;  ONLY NEEDS 180 MIN   ROTATOR CUFF REPAIR Right    skin cancer removal      Current Outpatient Medications  Medication Sig Dispense Refill   ARTIFICIAL TEAR SOLUTION OP Place 1 drop into both eyes 2 (two) times daily.     diltiazem (CARDIZEM CD) 240 MG 24 hr capsule Take 1 capsule (240 mg total) by mouth daily. 90 capsule 3   metoprolol tartrate (LOPRESSOR) 25 MG tablet Take 1 tablet (25 mg total) by mouth 2 (two) times daily. 180 tablet 3   rivaroxaban (XARELTO) 20 MG TABS tablet Take 1 tablet (20 mg total) by  mouth daily with supper. 30 tablet 0   senna-docusate (SENOKOT-S) 8.6-50 MG tablet Take 1 tablet by mouth daily as needed for mild constipation.     sodium chloride (NASAL MOIST) 0.65 % nasal spray Place 1 spray into the nose at bedtime.     traZODone (DESYREL) 100 MG tablet Take 100 mg by mouth at bedtime.     No current facility-administered medications for this encounter.    Allergies  Allergen Reactions   Amiodarone Swelling    Feet  swelling, hypertension, dizziness, and constipation    Prednisone Other (See Comments)    Constipation   Penicillins Swelling and Rash    Did it involve swelling of the face/tongue/throat, SOB, or low BP? Yes Did it involve sudden or severe rash/hives, skin peeling, or any reaction on the inside of your mouth or nose? Yes Did you need to seek medical attention at a hospital or doctor's office? No When did it last happen?    childhood   If all above answers are "NO", may proceed with cephalosporin use.      Social History   Socioeconomic History   Marital status: Married    Spouse name: Not on file   Number of children: Not on file   Years of education: Not on file   Highest education level: Not on file  Occupational History   Not on file  Tobacco Use   Smoking status: Never   Smokeless tobacco: Never   Tobacco comments:    Never smoke 10/06/22   Vaping Use   Vaping Use: Never used  Substance and Sexual Activity   Alcohol use: Never   Drug use: Never   Sexual activity: Not on file  Other Topics Concern   Not on file  Social History Narrative   Not on file   Social Determinants of Health   Financial Resource Strain: Not on file  Food Insecurity: No Food Insecurity (08/28/2022)   Hunger Vital Sign    Worried About Running Out of Food in the Last Year: Never true    Ran Out of Food in the Last Year: Never true  Transportation Needs: No Transportation Needs (08/28/2022)   PRAPARE - Hydrologist (Medical): No    Lack of Transportation (Non-Medical): No  Physical Activity: Not on file  Stress: Not on file  Social Connections: Not on file  Intimate Partner Violence: Not At Risk (08/28/2022)   Humiliation, Afraid, Rape, and Kick questionnaire    Fear of Current or Ex-Partner: No    Emotionally Abused: No    Physically Abused: No    Sexually Abused: No     ROS- All systems are reviewed and negative except as per the HPI above.  Physical  Exam: Vitals:   10/06/22 1039  BP: (!) 140/80  Pulse: 60  Weight: 108.8 kg  Height: 5' 10"$  (1.778 m)    GEN- The patient is a well appearing obese male, alert and oriented x 3 today.   Head- normocephalic, atraumatic Eyes-  Sclera clear, conjunctiva pink Ears- hearing intact Oropharynx- clear Neck- supple  Lungs- Clear to ausculation bilaterally, normal work of breathing Heart- Regular rate and rhythm, no murmurs, rubs or gallops  GI- soft, NT, ND, + BS Extremities- no clubbing, cyanosis, or edema MS- no significant deformity or atrophy Skin- no rash or lesion Psych- euthymic mood, full affect Neuro- strength and sensation are intact  Wt Readings from Last 3 Encounters:  10/06/22 108.8 kg  10/02/22  108 kg  09/29/22 112.5 kg    EKG today demonstrates  SR, 1st degree AV block Vent. rate 60 BPM PR interval 228 ms QRS duration 82 ms QT/QTcB 378/378 ms  Echo 08/28/22 demonstrated   1. Left ventricular ejection fraction, by estimation, is 55 to 60%. There  is mild concentric left ventricular hypertrophy.   2. Right ventricular systolic function is normal. The right ventricular  size is normal.   3. The mitral valve is normal in structure. No evidence of mitral valve  regurgitation.   4. The aortic valve was not well visualized. Aortic valve regurgitation  is not visualized.   5. The inferior vena cava is normal in size with greater than 50%  respiratory variability, suggesting right atrial pressure of 3 mmHg   Epic records are reviewed at length today  CHA2DS2-VASc Score = 3  The patient's score is based upon: CHF History: 0 HTN History: 1 Diabetes History: 0 Stroke History: 0 Vascular Disease History: 0 Age Score: 2 Gender Score: 0       ASSESSMENT AND PLAN: 1. Persistent Atrial Fibrillation (ICD10:  I48.19) The patient's CHA2DS2-VASc score is 3, indicating a 3.2% annual risk of stroke.   S/p DCCV 09/29/22 Patient appears to be maintaining SR. Suspect his  afib was tiggered by acute infections. Continue diltiazem 240 mg daily Continue Lopressor 25 mg BID Continue Xarelto 20 mg daily. Would be able to hold Xarelto x 2 days prior to renal biopsy after 10/30/22 (see pharmacy note 10/02/22).  2. Secondary Hypercoagulable State (ICD10:  D68.69) The patient is at significant risk for stroke/thromboembolism based upon his CHA2DS2-VASc Score of 3.  Continue Rivaroxaban (Xarelto).   3. Obesity Body mass index is 34.41 kg/m. Lifestyle modification was discussed at length including regular exercise and weight reduction.  4. Obstructive sleep apnea The importance of adequate treatment of sleep apnea was discussed today in order to improve our ability to maintain sinus rhythm long term.  5. HTN Stable, no changes today.   Follow up with Dr Marlou Porch as scheduled.    [facility] [address] [place], [place] [address] [phone] 10/06/2022 4:45 PM

## 2022-10-07 ENCOUNTER — Other Ambulatory Visit: Payer: Self-pay | Admitting: Interventional Radiology

## 2022-10-07 DIAGNOSIS — N2889 Other specified disorders of kidney and ureter: Secondary | ICD-10-CM

## 2022-10-07 NOTE — Telephone Encounter (Signed)
   Patient Name: CREW GOREN  DOB: 26-Jul-1946 MRN: [id number]  Primary Cardiologist: Candee Furbish, MD  Chart reviewed as part of pre-operative protocol coverage.   Mr. Viana recently had a DCCV and will not be able to hold his anticoagulation until at least March 7 th in order for him to  complete 4 weeks of uninterrupted therapy post DCCV.  Please send a revised surgical request when he is closer to this date so that we can give guidance on holding Xarelto.  Please call if have any additional questions.    Mable Fill, Marissa Nestle, NP 10/07/2022, 10:49 AM

## 2022-10-07 NOTE — Progress Notes (Signed)
Stanley Edouard, MD  Donita Brooks D I saw him in clinic. I'll do left renal mass bx under US guidance at either hospital after 3/7. Thanks. No rush on this. Last CT was at [facility]. Has CTA abd at [facility] last May.  GY

## 2022-10-08 DIAGNOSIS — Z905 Acquired absence of kidney: Secondary | ICD-10-CM | POA: Diagnosis not present

## 2022-10-08 DIAGNOSIS — R809 Proteinuria, unspecified: Secondary | ICD-10-CM | POA: Diagnosis not present

## 2022-10-08 DIAGNOSIS — I129 Hypertensive chronic kidney disease with stage 1 through stage 4 chronic kidney disease, or unspecified chronic kidney disease: Secondary | ICD-10-CM | POA: Diagnosis not present

## 2022-10-08 DIAGNOSIS — N1832 Chronic kidney disease, stage 3b: Secondary | ICD-10-CM | POA: Diagnosis not present

## 2022-10-08 DIAGNOSIS — N2889 Other specified disorders of kidney and ureter: Secondary | ICD-10-CM | POA: Diagnosis not present

## 2022-10-17 DIAGNOSIS — E875 Hyperkalemia: Secondary | ICD-10-CM | POA: Diagnosis not present

## 2022-10-31 ENCOUNTER — Other Ambulatory Visit: Payer: Self-pay | Admitting: Student

## 2022-10-31 DIAGNOSIS — N2889 Other specified disorders of kidney and ureter: Secondary | ICD-10-CM

## 2022-10-31 NOTE — Progress Notes (Incomplete)
Chief Complaint: Renal mass  Referring Physician(s): Winter,Christopher Marjory Lies   Supervising Physician: Aletta Edouard  Patient Status: [facility] - Out-pt  History of Present Illness: Stanley Juarez is a 77 y.o. male  known to our service after prior bilateral catheter directed thrombolytic infusion therapy for submassive bilateral acute pulmonary embolism on 12/24/2021.     At the time of pulmonary embolism, CT revealed an incidental asymptomatic 7 cm exophytic mass emanating from the lower pole of the right kidney.  This was resected by Dr. Lovena Neighbours on 05/09/2022.  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  He did quite well following surgery and has made a full recovery.   Imaging also revealed a contralateral enhancing mass of the mid left kidney estimated to measure approximately 2.5 cm in maximum diameter.    On the follow-up CT on 09/15/2022 it measures approximately 2.6 x 3.0 cm showing roughly 4 mm in growth in both dimensions.    Dr. Kathlene Cote reviewed options for treatment including continued surveillance, percutaneous biopsy and percutaneous cryoablation.    Given the fact he has a solitary kidney and some degree of renal insufficiency after right nephrectomy, any treatment of the left kidney would be at risk of worsening renal insufficiency.    He has elected to have a biopsy.  If this is another oncocytoma, a more conservative surveillance approach may be feasible with cryoablation reserved for either more significant future growth or development of symptoms.  If this turns out to be a carcinoma, cryoablation could be considered earlier.  He is NPO. He has held Media.  Past Medical History:  Diagnosis Date   Arthritis    back   Cancer ([place])    Hypertension    Pneumonia    Sleep apnea     Past Surgical History:  Procedure Laterality Date   CARDIOVERSION N/A 09/29/2022   Procedure: CARDIOVERSION;  Surgeon: Fay Records, MD;   Location: [facility] ENDOSCOPY;  Service: Cardiovascular;  Laterality: N/A;   CATARACT EXTRACTION     IR ANGIOGRAM PULMONARY BILATERAL SELECTIVE  12/25/2021   IR INFUSION THROMBOL ARTERIAL INITIAL (MS)  12/25/2021   IR INFUSION THROMBOL ARTERIAL INITIAL (MS)  12/25/2021   IR RADIOLOGIST EVAL & MGMT  01/30/2022   IR THROMB F/U EVAL ART/VEN FINAL DAY (MS)  12/25/2021   IR US GUIDE VASC ACCESS RIGHT  12/25/2021   IR US GUIDE VASC ACCESS RIGHT  12/25/2021   MENISCUS REPAIR Right    ROBOT ASSISTED LAPAROSCOPIC NEPHRECTOMY Right 05/09/2022   Procedure: XI ROBOTIC ASSISTED LAPAROSCOPIC NEPHRECTOMY;  Surgeon: Ceasar Mons, MD;  Location: [facility] ORS;  Service: Urology;  Laterality: Right;  ONLY NEEDS 180 MIN   ROTATOR CUFF REPAIR Right    skin cancer removal      Allergies: Amiodarone, Prednisone, and Penicillins  Medications: Prior to Admission medications   Medication Sig Start Date End Date Taking? Authorizing Provider  ARTIFICIAL TEAR SOLUTION OP Place 1 drop into both eyes 2 (two) times daily.    [provider]  diltiazem (CARDIZEM CD) 240 MG 24 hr capsule Take 1 capsule (240 mg total) by mouth daily. 09/26/22 07/27/23  Swinyer, Lanice Schwab, NP  metoprolol tartrate (LOPRESSOR) 25 MG tablet Take 1 tablet (25 mg total) by mouth 2 (two) times daily. 09/26/22 09/27/23  Swinyer, Lanice Schwab, NP  rivaroxaban (XARELTO) 20 MG TABS tablet Take 1 tablet (20 mg total) by mouth daily with supper. 01/18/22   Charlynne Cousins, MD  senna-docusate (SENOKOT-S) 8.6-50 MG tablet Take 1 tablet by mouth daily as needed for mild constipation.    [provider]  sodium chloride (NASAL MOIST) 0.65 % nasal spray Place 1 spray into the nose at bedtime.    [provider]  traZODone (DESYREL) 100 MG tablet Take 100 mg by mouth at bedtime. 12/03/21   [provider]     No family history on file.  Social History   Socioeconomic History   Marital status: Married    Spouse name: Not  on file   Number of children: Not on file   Years of education: Not on file   Highest education level: Not on file  Occupational History   Not on file  Tobacco Use   Smoking status: Never   Smokeless tobacco: Never   Tobacco comments:    Never smoke 10/06/22   Vaping Use   Vaping Use: Never used  Substance and Sexual Activity   Alcohol use: Never   Drug use: Never   Sexual activity: Not on file  Other Topics Concern   Not on file  Social History Narrative   Not on file   Social Determinants of Health   Financial Resource Strain: Not on file  Food Insecurity: No Food Insecurity (08/28/2022)   Hunger Vital Sign    Worried About Running Out of Food in the Last Year: Never true    Ran Out of Food in the Last Year: Never true  Transportation Needs: No Transportation Needs (08/28/2022)   PRAPARE - Hydrologist (Medical): No    Lack of Transportation (Non-Medical): No  Physical Activity: Not on file  Stress: Not on file  Social Connections: Not on file     Review of Systems: A 12 point ROS discussed and pertinent positives are indicated in the HPI above.  All other systems are negative.  Review of Systems  Vital Signs: There were no vitals taken for this visit.  Physical Exam Vitals reviewed.  Constitutional:      Appearance: Normal appearance.  HENT:     Head: Normocephalic and atraumatic.  Eyes:     Extraocular Movements: Extraocular movements intact.  Cardiovascular:     Rate and Rhythm: Normal rate and regular rhythm.  Pulmonary:     Effort: Pulmonary effort is normal. No respiratory distress.     Breath sounds: Normal breath sounds.  Abdominal:     General: There is no distension.     Palpations: Abdomen is soft.     Tenderness: There is no abdominal tenderness.  Musculoskeletal:        General: Normal range of motion.     Cervical back: Normal range of motion.  Skin:    General: Skin is warm and dry.  Neurological:      General: No focal deficit present.     Mental Status: He is alert and oriented to person, place, and time.  Psychiatric:        Mood and Affect: Mood normal.        Behavior: Behavior normal.        Thought Content: Thought content normal.        Judgment: Judgment normal.     Imaging: No results found.  Labs:  CBC: Recent Labs    08/27/22 0420 08/28/22 0258 08/29/22 0505 09/26/22 1145 09/29/22 0943  WBC 9.2 7.6 7.3 6.5  --   HGB 12.2* 13.1 13.0 14.8 15.6  HCT 37.0* 40.4 40.0 44.1  46.0  PLT 231 291 362 183  --     COAGS: Recent Labs    12/25/21 0256  INR 1.2  APTT 90*    BMP: Recent Labs    08/26/22 1750 08/27/22 0420 08/28/22 0258 08/29/22 0505 09/26/22 1145 09/29/22 0943  NA 134* 134* 137 137 140 141  K 5.0 4.1 4.5 4.1 4.6 4.5  CL 102 104 108 107 107* 107  CO2 21* 22 21* 21* 20  --   GLUCOSE 159* 105* 114* 119* 85 106*  BUN 25* 19 18 26* 30* 26*  CALCIUM 8.5* 7.8* 8.6* 8.3* 8.9  --   CREATININE 1.83* 1.75* 1.65* 1.68* 1.68* 1.70*  GFRNONAA 38* 40* 43* 42*  --   --     LIVER FUNCTION TESTS: Recent Labs    12/24/21 1325 12/26/21 0308 12/26/21 1036  BILITOT 1.4* 0.9 0.7  AST '31 20 17  '$ ALT '29 21 19  '$ ALKPHOS 65 63 60  PROT 7.5 6.3* 6.2*  ALBUMIN 4.0 3.4* 3.2*    TUMOR MARKERS: No results for input(s): "AFPTM", "CEA", "CA199", "CHROMGRNA" in the last 8760 hours.  Assessment and Plan:  Enhancing mass of the mid left kidney estimated to measure approximately 2.5 cm in maximum diameter.    Will proceed with image guided biopsy of the left kidney lesion today by Dr. Andres Labrum.  Risks and benefits of renal lesion biopsy was discussed with the patient and/or patient's family including, but not limited to bleeding, infection, damage to adjacent structures or low yield requiring additional tests.  All of the questions were answered and there is agreement to proceed.  Consent signed and in chart.   Thank you for allowing our service to  participate in ARLESTER ARREDONDO 's care.  Electronically Signed: Murrell Redden, PA-C   10/31/2022, 1:11 PM      I spent a total of    25 Minutes in face to face in clinical consultation, greater than 50% of which was counseling/coordinating care for kidney lesion biopsy.

## 2022-11-03 ENCOUNTER — Ambulatory Visit (HOSPITAL_COMMUNITY)
Admission: RE | Admit: 2022-11-03 | Discharge: 2022-11-03 | Disposition: A | Discharge status: Home / Self Care | Payer: Medicare HMO | Source: Ambulatory Visit | Attending: Interventional Radiology | Admitting: Interventional Radiology

## 2022-11-03 DIAGNOSIS — Z905 Acquired absence of kidney: Secondary | ICD-10-CM | POA: Diagnosis not present

## 2022-11-03 DIAGNOSIS — D4101 Neoplasm of uncertain behavior of right kidney: Secondary | ICD-10-CM | POA: Insufficient documentation

## 2022-11-03 DIAGNOSIS — D49512 Neoplasm of unspecified behavior of left kidney: Secondary | ICD-10-CM | POA: Diagnosis not present

## 2022-11-03 DIAGNOSIS — N2889 Other specified disorders of kidney and ureter: Secondary | ICD-10-CM | POA: Diagnosis not present

## 2022-11-03 DIAGNOSIS — D4102 Neoplasm of uncertain behavior of left kidney: Secondary | ICD-10-CM | POA: Diagnosis not present

## 2022-11-03 LAB — CBC
HCT: 45.9 % (ref 39.0–52.0)
Hemoglobin: 15.4 g/dL (ref 13.0–17.0)
MCH: 30.9 pg (ref 26.0–34.0)
MCHC: 33.6 g/dL (ref 30.0–36.0)
MCV: 92 fL (ref 80.0–100.0)
Platelets: 193 10*3/uL (ref 150–400)
RBC: 4.99 MIL/uL (ref 4.22–5.81)
RDW: 14.1 % (ref 11.5–15.5)
WBC: 5.8 10*3/uL (ref 4.0–10.5)
nRBC: 0 % (ref 0.0–0.2)

## 2022-11-03 LAB — PROTIME-INR
INR: 1 (ref 0.8–1.2)
Prothrombin Time: 12.6 seconds (ref 11.4–15.2)

## 2022-11-03 MED ORDER — FENTANYL CITRATE (PF) 100 MCG/2ML IJ SOLN
INTRAMUSCULAR | Status: AC | PRN
  Administered 2022-11-03 (×2): 25 ug via INTRAVENOUS

## 2022-11-03 MED ORDER — SODIUM CHLORIDE 0.9 % IV SOLN: INTRAVENOUS | Status: DC

## 2022-11-03 MED ORDER — LIDOCAINE HCL (PF) 1 % IJ SOLN
8.0000 mL | Freq: Once | INTRAMUSCULAR | Status: AC
  Administered 2022-11-03: 8 mL via INTRADERMAL

## 2022-11-03 MED ORDER — MIDAZOLAM HCL 2 MG/2ML IJ SOLN
INTRAMUSCULAR | Status: AC | PRN
  Administered 2022-11-03: .5 mg via INTRAVENOUS
  Administered 2022-11-03: 1 mg via INTRAVENOUS

## 2022-11-03 MED ORDER — GELATIN ABSORBABLE 12-7 MM EX MISC: 1.0000 | Freq: Once | CUTANEOUS | Status: DC

## 2022-11-03 MED ORDER — FENTANYL CITRATE (PF) 100 MCG/2ML IJ SOLN
INTRAMUSCULAR | Status: AC
  Filled 2022-11-03: qty 2

## 2022-11-03 MED ORDER — MIDAZOLAM HCL 2 MG/2ML IJ SOLN
INTRAMUSCULAR | Status: AC
  Filled 2022-11-03: qty 2

## 2022-11-03 NOTE — Procedures (Signed)
Interventional Radiology Procedure Note  Procedure: US Guided Biopsy of left renal mass  Complications: None  Estimated Blood Loss: < 10 mL  Findings: 18 G core biopsy of left renal mass performed under US guidance.  Two core samples obtained and sent to Pathology.  Venetia Night. Kathlene Cote, M.D Pager:  [phone]

## 2022-11-05 LAB — SURGICAL PATHOLOGY

## 2022-11-06 ENCOUNTER — Other Ambulatory Visit: Payer: Self-pay | Admitting: Urology

## 2022-11-06 DIAGNOSIS — N2889 Other specified disorders of kidney and ureter: Secondary | ICD-10-CM

## 2022-11-07 ENCOUNTER — Ambulatory Visit
Admission: RE | Admit: 2022-11-07 | Discharge: 2022-11-07 | Disposition: A | Discharge status: Home / Self Care | Payer: Medicare HMO | Source: Ambulatory Visit | Attending: Urology | Admitting: Urology

## 2022-11-07 DIAGNOSIS — N2889 Other specified disorders of kidney and ureter: Secondary | ICD-10-CM

## 2022-11-07 NOTE — Progress Notes (Signed)
Chief Complaint: Patient was consulted remotely today (TeleHealth) for follow up after left renal mass biopsy.  History of Present Illness: Stanley Juarez is a 77 y.o. 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 Ultrasound-guided biopsy of the mass was performed on 11/03/2022 with good visualization of the mass by ultrasound.  Mass dimensions at that time by ultrasound were estimated to be approximately 3.8 x 3.5 x 3.5 cm.  He did well after the biopsy with no complaints or evidence of complication.  He denies any hematuria, left flank pain or fever following the procedure.  Biopsy results were discussed by telephone today.  Past Medical History:  Diagnosis Date   Arthritis    back   Cancer ([place])    Hypertension    Pneumonia    Sleep apnea     Past Surgical History:  Procedure Laterality Date   CARDIOVERSION N/A 09/29/2022   Procedure: CARDIOVERSION;  Surgeon: Fay Records, MD;  Location: [facility] ENDOSCOPY;  Service: Cardiovascular;  Laterality: N/A;   CATARACT EXTRACTION     IR ANGIOGRAM PULMONARY BILATERAL SELECTIVE  12/25/2021   IR INFUSION THROMBOL ARTERIAL INITIAL (MS)  12/25/2021   IR INFUSION THROMBOL ARTERIAL INITIAL (MS)  12/25/2021   IR RADIOLOGIST EVAL & MGMT  01/30/2022   IR THROMB F/U EVAL ART/VEN FINAL DAY (MS)  12/25/2021   IR US GUIDE VASC ACCESS RIGHT  12/25/2021   IR US GUIDE VASC ACCESS RIGHT  12/25/2021   MENISCUS REPAIR Right    ROBOT ASSISTED LAPAROSCOPIC NEPHRECTOMY Right 05/09/2022   Procedure: XI ROBOTIC ASSISTED LAPAROSCOPIC NEPHRECTOMY;  Surgeon: Ceasar Mons, MD;  Location: [facility] ORS;  Service: Urology;  Laterality: Right;  ONLY NEEDS 180 MIN   ROTATOR CUFF REPAIR Right    skin cancer removal      Allergies: Amiodarone, Prednisone, and Penicillins  Medications: Prior to Admission medications   Medication Sig Start Date End Date Taking?  Authorizing Provider  ARTIFICIAL TEAR SOLUTION OP Place 1 drop into both eyes 2 (two) times daily.    [provider]  diltiazem (CARDIZEM CD) 240 MG 24 hr capsule Take 1 capsule (240 mg total) by mouth daily. 09/26/22 07/27/23  Swinyer, Lanice Schwab, NP  metoprolol tartrate (LOPRESSOR) 25 MG tablet Take 1 tablet (25 mg total) by mouth 2 (two) times daily. 09/26/22 09/27/23  Swinyer, Lanice Schwab, NP  rivaroxaban (XARELTO) 20 MG TABS tablet Take 1 tablet (20 mg total) by mouth daily with supper. 01/18/22   Charlynne Cousins, MD  senna-docusate (SENOKOT-S) 8.6-50 MG tablet Take 1 tablet by mouth daily as needed for mild constipation.    [provider]  sodium chloride (NASAL MOIST) 0.65 % nasal spray Place 1 spray into the nose at bedtime.    [provider]  traZODone (DESYREL) 100 MG tablet Take 100 mg by mouth at bedtime. 12/03/21   [provider]     No family history on file.  Social History   Socioeconomic History   Marital status: Married    Spouse name: Not on file   Number of children: Not on file   Years of education: Not on file   Highest education level: Not on file  Occupational History   Not on file  Tobacco Use   Smoking status: Never   Smokeless tobacco: Never   Tobacco comments:    Never smoke 10/06/22  Vaping Use   Vaping Use: Never used  Substance and Sexual Activity   Alcohol use: Never   Drug use: Never   Sexual activity: Not on file  Other Topics Concern   Not on file  Social History Narrative   Not on file   Social Determinants of Health   Financial Resource Strain: Not on file  Food Insecurity: No Food Insecurity (08/28/2022)   Hunger Vital Sign    Worried About Running Out of Food in the Last Year: Never true    Ran Out of Food in the Last Year: Never true  Transportation Needs: No Transportation Needs (08/28/2022)   PRAPARE - Hydrologist (Medical): No    Lack of Transportation (Non-Medical):  No  Physical Activity: Not on file  Stress: Not on file  Social Connections: Not on file     Review of Systems  Constitutional: Negative.   Respiratory: Negative.    Cardiovascular: Negative.   Gastrointestinal: Negative.   Genitourinary: Negative.   Musculoskeletal: Negative.   Neurological: Negative.     Review of Systems: A 12 point ROS discussed and pertinent positives are indicated in the HPI above.  All other systems are negative.   Physical Exam No direct physical exam was performed (except for noted visual exam findings with Video Visits).   Vital Signs: There were no vitals taken for this visit.  Imaging: US BIOPSY (KIDNEY)  Result Date: 11/03/2022 INDICATION: Left renal mass and history right nephrectomy for resection of 7 cm oncocytoma. EXAM: ULTRASOUND GUIDED CORE BIOPSY OF LEFT RENAL MASS MEDICATIONS: None. ANESTHESIA/SEDATION: Moderate (conscious) sedation was employed during this procedure. A total of Versed 1.5 mg and Fentanyl 50 mcg was administered intravenously by radiology nursing. Moderate Sedation Time: 16 minutes. The patient's level of consciousness and vital signs were monitored continuously by radiology nursing throughout the procedure under my direct supervision. PROCEDURE: The procedure, risks, benefits, and alternatives were explained to the patient. Questions regarding the procedure were encouraged and answered. The patient understands and consents to the procedure. A time-out was performed prior to initiating the procedure. Ultrasound was utilized to localize the left kidney and a left renal mass. The left lateral flank region was prepped with chlorhexidine in a sterile fashion, and a sterile drape was applied covering the operative field. A sterile gown and sterile gloves were used for the procedure. Local anesthesia was provided with 1% Lidocaine. A 17 gauge trocar needle was advanced to the lateral margin of a left renal mass. 2 separate coaxial 18 gauge  core biopsy samples were obtained through the mass. Core biopsy samples were submitted in formalin. Gel-Foam pledgets were then advanced through the outer needle prior to its retraction and removal. Additional ultrasound was performed. COMPLICATIONS: None immediate. FINDINGS: Ultrasound demonstrates a well-circumscribed, solid hypoechoic mass emanating from the lateral mid cortex of the left kidney measuring roughly 3.8 x 3.5 x 3.5 cm by ultrasound. Solid core biopsy samples were obtained. IMPRESSION: Ultrasound-guided core biopsy performed of a left renal mass measuring 3.8 cm in greatest diameter by ultrasound. Electronically Signed   By: Aletta Edouard M.D.   On: 11/03/2022 09:34    Labs:  CBC: Recent Labs    08/28/22 0258 08/29/22 0505 09/26/22 1145 09/29/22 0943 11/03/22 0646  WBC 7.6 7.3 6.5  --  5.8  HGB 13.1 13.0 14.8 15.6 15.4  HCT 40.4 40.0 44.1 46.0 45.9  PLT 291 362 183  --  193    COAGS: Recent  Labs    12/25/21 0256 11/03/22 0646  INR 1.2 1.0  APTT 90*  --     BMP: Recent Labs    08/26/22 1750 08/27/22 0420 08/28/22 0258 08/29/22 0505 09/26/22 1145 09/29/22 0943  NA 134* 134* 137 137 140 141  K 5.0 4.1 4.5 4.1 4.6 4.5  CL 102 104 108 107 107* 107  CO2 21* 22 21* 21* 20  --   GLUCOSE 159* 105* 114* 119* 85 106*  BUN 25* 19 18 26* 30* 26*  CALCIUM 8.5* 7.8* 8.6* 8.3* 8.9  --   CREATININE 1.83* 1.75* 1.65* 1.68* 1.68* 1.70*  GFRNONAA 38* 40* 43* 42*  --   --     LIVER FUNCTION TESTS: Recent Labs    12/24/21 1325 12/26/21 0308 12/26/21 1036  BILITOT 1.4* 0.9 0.7  AST 31 20 17   ALT 29 21 19   ALKPHOS 65 63 60  PROT 7.5 6.3* 6.2*  ALBUMIN 4.0 3.4* 3.2*    Assessment and Plan:  I spoke with Stanley Juarez by phone.  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  This is the same pathology as the previously resected contralateral right renal mass.  I recommended to Stanley Juarez that we  continue to observe the mass for now given that he only has the solitary left kidney and underlying chronic kidney disease with a GFR of approximately 38-43 mL/min.  He is asymptomatic and was also asymptomatic at the time of resection of the contralateral 7 cm right-sided renal oncocytoma.  I recommended an MRI of the abdomen without contrast to follow size of the left renal oncocytoma in approximately 1 year.  FINAL MICROSCOPIC DIAGNOSIS:   A. KIDNEY, LEFT, BIOPSY:       Oncocytic renal neoplasm.       See comment.   COMMENT:   The specimen demonstrate an epithelioid proliferation with oncocytic  features forming solid nests. There is no significant cytological atypia  or necrosis. Immunohistochemical stains show the neoplastic cells are  diffusely positive for CD117 and patchy positive for CK7. The findings  in keeping with a oncocytic renal neoplasm with differential diagnosis  including oncocytoma vs oncocytic renal neoplasm with low malignant  potential.    Electronically Signed: Azzie Roup 11/07/2022, 3:34 PM    I spent a total of 10 Minutes in remote  clinical consultation, greater than 50% of which was counseling/coordinating care for a left renal mass.    Visit type: Audio only (telephone). Audio (no video) only due to patient's lack of internet/smartphone capability. Alternative for in-person consultation at [facility], [address], [place], [place]. This visit type was conducted due to national recommendations for restrictions regarding the COVID-19 Pandemic (e.g. social distancing).  This format is felt to be most appropriate for this patient at this time.  All issues noted in this document were discussed and addressed.

## 2022-12-05 ENCOUNTER — Encounter: Payer: Self-pay | Admitting: Cardiology

## 2022-12-05 ENCOUNTER — Ambulatory Visit (INDEPENDENT_AMBULATORY_CARE_PROVIDER_SITE_OTHER): Payer: Medicare HMO

## 2022-12-05 ENCOUNTER — Ambulatory Visit: Payer: Medicare HMO | Attending: Cardiology | Admitting: Cardiology

## 2022-12-05 VITALS — BP 130/80 | HR 54 | Ht 70.0 in | Wt 242.0 lb

## 2022-12-05 DIAGNOSIS — I4819 Other persistent atrial fibrillation: Secondary | ICD-10-CM | POA: Diagnosis not present

## 2022-12-05 NOTE — Progress Notes (Unsigned)
Enrolled patient for a 14 day Zio XT  monitor to be mailed to patients home  °

## 2022-12-05 NOTE — Progress Notes (Signed)
Cardiology Office Note:    Date:  12/05/2022   ID:  Stanley Juarez, DOB 06-Nov-1945, MRN [id number]  PCP:  Stanley Raider, MD   [facility] Providers Cardiologist:  Stanley Schultz, MD     Referring MD: Stanley Raider, MD    History of Present Illness:    Stanley Juarez is a 77 y.o. 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  Atrial fibrillation clinic with Stanley Juarez.  Xarelto.   DCCV on 09/29/22.  Was maintaining sinus rhythm on his office visit follow-up with Stanley Juarez.  Sepsis PNA 08/2022.  May have ultimately triggered the atrial fibrillation.  He has been feeling some fatigue, tiredness.  On both metoprolol 25 mg twice a day as well as Cardizem 240 mg a day.    Past Medical History:  Diagnosis Date   Arthritis    back   Atrial fibrillation    Cancer    Chronic kidney failure    DVT (deep venous thrombosis)    History of pulmonary embolus (PE)    Hypertension    Pneumonia    Proteinuria    Sleep apnea     Past Surgical History:  Procedure Laterality Date   CARDIOVERSION N/A 09/29/2022   Procedure: CARDIOVERSION;  Surgeon: Pricilla Riffle, MD;  Location: [facility] ENDOSCOPY;  Service: Cardiovascular;  Laterality: N/A;   CATARACT EXTRACTION     IR ANGIOGRAM PULMONARY BILATERAL SELECTIVE  12/25/2021   IR INFUSION THROMBOL ARTERIAL INITIAL (MS)  12/25/2021   IR INFUSION THROMBOL ARTERIAL INITIAL (MS)  12/25/2021   IR RADIOLOGIST EVAL & MGMT  01/30/2022   IR THROMB F/U EVAL ART/VEN FINAL DAY (MS)  12/25/2021   IR US GUIDE VASC ACCESS RIGHT  12/25/2021   IR US GUIDE VASC ACCESS RIGHT  12/25/2021   MENISCUS REPAIR Right    ROBOT ASSISTED LAPAROSCOPIC NEPHRECTOMY Right 05/09/2022   Procedure: XI ROBOTIC ASSISTED LAPAROSCOPIC NEPHRECTOMY;  Surgeon: Rene Paci, MD;  Location: [facility] ORS;  Service: Urology;  Laterality: Right;   ONLY NEEDS 180 MIN   ROTATOR CUFF REPAIR Right    skin cancer removal      Current Medications: Current Meds  Medication Sig   ARTIFICIAL TEAR SOLUTION OP Place 1 drop into both eyes 2 (two) times daily.   diltiazem (CARDIZEM CD) 240 MG 24 hr capsule Take 1 capsule (240 mg total) by mouth daily.   metoprolol tartrate (LOPRESSOR) 25 MG tablet Take 1 tablet (25 mg total) by mouth 2 (two) times daily.   rivaroxaban (XARELTO) 20 MG TABS tablet Take 1 tablet (20 mg total) by mouth daily with supper.   sodium chloride (NASAL MOIST) 0.65 % nasal spray Place 1 spray into the nose at bedtime.   traZODone (DESYREL) 100 MG tablet Take 100 mg by mouth at bedtime.     Allergies:   Amiodarone, Prednisone, and Penicillins   Social History   Socioeconomic History   Marital status: Married    Spouse name: Not on file   Number of children: Not on file   Years of education: Not on file   Highest education level: Not on file  Occupational History   Not on file  Tobacco Use   Smoking status: Never   Smokeless tobacco: Never   Tobacco comments:    Never smoke 10/06/22   Vaping Use   Vaping Use: Never used  Substance  and Sexual Activity   Alcohol use: Never   Drug use: Never   Sexual activity: Not on file  Other Topics Concern   Not on file  Social History Narrative   Not on file   Social Determinants of Health   Financial Resource Strain: Not on file  Food Insecurity: No Food Insecurity (08/28/2022)   Hunger Vital Sign    Worried About Running Out of Food in the Last Year: Never true    Ran Out of Food in the Last Year: Never true  Transportation Needs: No Transportation Needs (08/28/2022)   PRAPARE - Administrator, Civil Service (Medical): No    Lack of Transportation (Non-Medical): No  Physical Activity: Not on file  Stress: Not on file  Social Connections: Not on file     ROS:   Please see the history of present illness.     All other systems reviewed and are  negative.  EKGs/Labs/Other Studies Reviewed:    The following studies were reviewed today: Cardiac Studies & Procedures       ECHOCARDIOGRAM  ECHOCARDIOGRAM LIMITED 08/28/2022  Narrative ECHOCARDIOGRAM LIMITED REPORT    Patient Name:   Stanley Juarez Date of Exam: 08/28/2022 Medical Rec #:  [id number]      Height:       70.0 in Accession #:    [id number]     Weight:       229.0 lb Date of Birth:  November 04, 1945       BSA:          2.211 m Patient Age:    76 years       BP:           130/90 mmHg Patient Gender: M              HR:           125 bpm. Exam Location:  Inpatient  Procedure: Limited Echo, Cardiac Doppler and Color Doppler  Indications:    Stanley Boast, MD  History:        Patient has prior history of Echocardiogram examinations, most recent 04/09/2022. CAD; Risk Factors:Hypertension and Sleep Apnea. Pulmonary embolism and DVT in August.  Sonographer:    Stanley Juarez RDCS Referring Phys: [id number] [facility]  IMPRESSIONS   1. Left ventricular ejection fraction, by estimation, is 55 to 60%. There is mild concentric left ventricular hypertrophy. 2. Right ventricular systolic function is normal. The right ventricular size is normal. 3. The mitral valve is normal in structure. No evidence of mitral valve regurgitation. 4. The aortic valve was not well visualized. Aortic valve regurgitation is not visualized. 5. The inferior vena cava is normal in size with greater than 50% respiratory variability, suggesting right atrial pressure of 3 mmHg.  FINDINGS Left Ventricle: Left ventricular ejection fraction, by estimation, is 55 to 60%. The left ventricular internal cavity size was normal in size. There is mild concentric left ventricular hypertrophy.  Right Ventricle: The right ventricular size is normal. Right ventricular systolic function is normal.  Left Atrium: Left atrial size was normal in size.  Right Atrium: Right atrial size was normal in size.  Pericardium: There  is no evidence of pericardial effusion. Presence of epicardial fat layer.  Mitral Valve: The mitral valve is normal in structure.  Aortic Valve: The aortic valve was not well visualized. Aortic valve regurgitation is not visualized.  Venous: The inferior vena cava is normal in size with greater than 50% respiratory variability,  suggesting right atrial pressure of 3 mmHg.  Additional Comments: Spectral Doppler performed. Color Doppler performed.  LEFT VENTRICLE PLAX 2D LVIDd:         4.40 cm LVIDs:         3.60 cm LV PW:         1.25 cm LV IVS:        1.25 cm LVOT diam:     2.20 cm LV SV:         48 LV SV Index:   21 LVOT Area:     3.80 cm   LEFT ATRIUM             Index        RIGHT ATRIUM           Index LA Vol (A2C):   41.0 ml 18.54 ml/m  RA Area:     12.60 cm LA Vol (A4C):   40.6 ml 18.36 ml/m  RA Volume:   25.20 ml  11.40 ml/m LA Biplane Vol: 41.4 ml 18.72 ml/m AORTIC VALVE LVOT Vmax:   82.90 cm/s LVOT Vmean:  57.700 cm/s LVOT VTI:    0.125 m   SHUNTS Systemic VTI:  0.12 m Systemic Diam: 2.20 cm  Epifanio Lesches MD Electronically signed by Epifanio Lesches MD Signature Date/Time: 08/28/2022/11:08:12 AM    Final              EKG: 10/06/2022-sinus rhythm 60 mild first-degree AV block.  Recent Labs: 12/26/2021: ALT 19 12/27/2021: B Natriuretic Peptide 72.3 08/28/2022: TSH 2.413 08/29/2022: Magnesium 1.9 09/29/2022: BUN 26; Creatinine, Ser 1.70; Potassium 4.5; Sodium 141 11/03/2022: Hemoglobin 15.4; Platelets 193  Recent Lipid Panel No results found for: "CHOL", "TRIG", "HDL", "CHOLHDL", "VLDL", "LDLCALC", "LDLDIRECT"   Risk Assessment/Calculations:    CHA2DS2-VASc Score = 3   This indicates a 3.2% annual risk of stroke. The patient's score is based upon: CHF History: 0 HTN History: 1 Diabetes History: 0 Stroke History: 0 Vascular Disease History: 0 Age Score: 2 Gender Score: 0              Physical Exam:    VS:  BP 130/80 (BP  Location: Left Arm, Patient Position: Sitting, Cuff Size: Normal)   Pulse (!) 54   Ht  (1.778 m)   Wt 242 lb (109.8 kg)   SpO2 98%   BMI 34.72 kg/m     Wt Readings from Last 3 Encounters:  12/05/22 242 lb (109.8 kg)  11/03/22 240 lb (108.9 kg)  10/06/22 239 lb 12.8 oz (108.8 kg)     GEN:  Well nourished, well developed in no acute distress HEENT: Normal NECK: No JVD; No carotid bruits LYMPHATICS: No lymphadenopathy CARDIAC: RRR, no murmurs, rubs, gallops, rare ectopy RESPIRATORY:  Clear to auscultation without rales, wheezing or rhonchi  ABDOMEN: Soft, non-tender, non-distended MUSCULOSKELETAL:  No edema; No deformity  SKIN: Warm and dry NEUROLOGIC:  Alert and oriented x 3 PSYCHIATRIC:  Normal affect   ASSESSMENT:    1. Persistent atrial fibrillation    PLAN:    In order of problems listed above:  Paroxysmal atrial fibrillation - Status post cardioversion in February 2024.  Maintaining sinus rhythm/sinus bradycardia.  He does feel some fatigue and loss of stamina.  Could be from his illness back in January.  Continue to work on exercise efforts. - We will check a ZIO monitor to ensure that he is maintaining adequate control.  Chronic anticoagulation - Continue with Xarelto.  Hemoglobin 15.4, creatinine 1.8.  Watch for any signs of bleeding.  Obstructive sleep apnea - Sees sleep physician soon.  He has been taking a sleeping medication when he wakes up at 2 AM in the morning.            Medication Adjustments/Labs and Tests Ordered: Current medicines are reviewed at length with the patient today.  Concerns regarding medicines are outlined above.  Orders Placed This Encounter  Procedures   LONG TERM MONITOR (3-14 DAYS)   No orders of the defined types were placed in this encounter.   Patient Instructions  Medication Instructions:  The current medical regimen is effective;  continue present plan and medications.  *If you need a refill on your cardiac  medications before your next appointment, please call your pharmacy*  Testing/Procedures: ZIO XT- Long Term Monitor Instructions  Your physician has requested you wear a ZIO patch monitor for 14 days.  This is a single patch monitor. [employer] supplies one patch monitor per enrollment. Additional stickers are not available. Please do not apply patch if you will be having a Nuclear Stress Test,  Echocardiogram, Cardiac CT, MRI, or Chest Xray during the period you would be wearing the  monitor. The patch cannot be worn during these tests. You cannot remove and re-apply the  ZIO XT patch monitor.  Your ZIO patch monitor will be mailed 3 day USPS to your address on file. It may take 3-5 days  to receive your monitor after you have been enrolled.  Once you have received your monitor, please review the enclosed instructions. Your monitor  has already been registered assigning a specific monitor serial # to you.  Billing and Patient Assistance Program Information  We have supplied [employer] with any of your insurance information on file for billing purposes. [employer] offers a sliding scale Patient Assistance Program for patients that do not have  insurance, or whose insurance does not completely cover the cost of the ZIO monitor.  You must apply for the Patient Assistance Program to qualify for this discounted rate.  To apply, please call [employer] at [phone], select option 4, select option 2, ask to apply for  Patient Assistance Program. Meredeth Ide will ask your household income, and how many people  are in your household. They will quote your out-of-pocket cost based on that information.  [employer] will also be able to set up a 41-month, interest-free payment plan if needed.  Applying the monitor   Shave hair from upper left chest.  Hold abrader disc by orange tab. Rub abrader in 40 strokes over the upper left chest as  indicated in your monitor instructions.  Clean area with 4 enclosed alcohol  pads. Let dry.  Apply patch as indicated in monitor instructions. Patch will be placed under collarbone on left  side of chest with arrow pointing upward.  Rub patch adhesive wings for 2 minutes. Remove white label marked "1". Remove the white  label marked "2". Rub patch adhesive wings for 2 additional minutes.  While looking in a mirror, press and release button in center of patch. A small green light will  flash 3-4 times. This will be your only indicator that the monitor has been turned on.  Do not shower for the first 24 hours. You may shower after the first 24 hours.  Press the button if you feel a symptom. You will hear a small click. Record Date, Time and  Symptom in the Patient Logbook.  When you are ready to remove the patch, follow instructions  on the last 2 pages of Patient  Logbook. Stick patch monitor onto the last page of Patient Logbook.  Place Patient Logbook in the blue and white box. Use locking tab on box and tape box closed  securely. The blue and white box has prepaid postage on it. Please place it in the mailbox as  soon as possible. Your physician should have your test results approximately 7 days after the  monitor has been mailed back to [facility].  Call [facility] Customer Care at [phone] if you have questions regarding  your ZIO XT patch monitor. Call them immediately if you see an orange light blinking on your  monitor.  If your monitor falls off in less than 4 days, contact our Monitor department at [phone].  If your monitor becomes loose or falls off after 4 days call [employer] at [phone] for  suggestions on securing your monitor  Follow-Up: At [facility], you and your health needs are our priority.  As part of our continuing mission to provide you with exceptional heart care, we have created designated Provider Care Teams.  These Care Teams include your primary Cardiologist (physician) and Advanced Practice Providers (APPs -   Physician Assistants and Nurse Practitioners) who all work together to provide you with the care you need, when you need it.  We recommend signing up for the patient portal called "MyChart".  Sign up information is provided on this After Visit Summary.  MyChart is used to connect with patients for Virtual Visits (Telemedicine).  Patients are able to view lab/test results, encounter notes, upcoming appointments, etc.  Non-urgent messages can be sent to your provider as well.   To learn more about what you can do with MyChart, go to [email or web address].    Your next appointment:   6 month(s)  Provider:   Jari Favre, PA-C, Robin Searing, NP, Jacolyn Reedy, PA-C, Eligha Bridegroom, NP, or Tereso Newcomer, PA-C      Then, Stanley Schultz, MD will plan to see you again in 1 year(s).       Signed, Stanley Schultz, MD  12/05/2022 12:12 PM    [facility]

## 2022-12-05 NOTE — Patient Instructions (Signed)
Medication Instructions:  The current medical regimen is effective;  continue present plan and medications.  *If you need a refill on your cardiac medications before your next appointment, please call your pharmacy*  Testing/Procedures: [place] Monitor Instructions  Your physician has requested you wear a ZIO patch monitor for 14 days.  This is a single patch monitor. [employer] supplies one patch monitor per enrollment. Additional stickers are not available. Please do not apply patch if you will be having a Nuclear Stress Test,  Echocardiogram, Cardiac CT, MRI, or Chest Xray during the period you would be wearing the  monitor. The patch cannot be worn during these tests. You cannot remove and re-apply the  ZIO XT patch monitor.  Your ZIO patch monitor will be mailed 3 day USPS to your address on file. It may take 3-5 days  to receive your monitor after you have been enrolled.  Once you have received your monitor, please review the enclosed instructions. Your monitor  has already been registered assigning a specific monitor serial # to you.  Billing and Patient Assistance Program Information  We have supplied [employer] with any of your insurance information on file for billing purposes. [employer] offers a sliding scale Patient Assistance Program for patients that do not have  insurance, or whose insurance does not completely cover the cost of the ZIO monitor.  You must apply for the Patient Assistance Program to qualify for this discounted rate.  To apply, please call [employer] at [phone], select option 4, select option 2, ask to apply for  Patient Assistance Program. Theodore Demark will ask your household income, and how many people  are in your household. They will quote your out-of-pocket cost based on that information.  [employer] will also be able to set up a 24-month interest-free payment plan if needed.  Applying the monitor   Shave hair from upper left chest.  Hold abrader disc  by orange tab. Rub abrader in 40 strokes over the upper left chest as  indicated in your monitor instructions.  Clean area with 4 enclosed alcohol pads. Let dry.  Apply patch as indicated in monitor instructions. Patch will be placed under collarbone on left  side of chest with arrow pointing upward.  Rub patch adhesive wings for 2 minutes. Remove white label marked "1". Remove the white  label marked "2". Rub patch adhesive wings for 2 additional minutes.  While looking in a mirror, press and release button in center of patch. A small green light will  flash 3-4 times. This will be your only indicator that the monitor has been turned on.  Do not shower for the first 24 hours. You may shower after the first 24 hours.  Press the button if you feel a symptom. You will hear a small click. Record Date, Time and  Symptom in the Patient Logbook.  When you are ready to remove the patch, follow instructions on the last 2 pages of Patient  Logbook. Stick patch monitor onto the last page of Patient Logbook.  Place Patient Logbook in the blue and white box. Use locking tab on box and tape box closed  securely. The blue and white box has prepaid postage on it. Please place it in the mailbox as  soon as possible. Your physician should have your test results approximately 7 days after the  monitor has been mailed back to [facility]  Call [employer] [phone] you have questions regarding  your ZIO XT patch  monitor. Call them immediately if you see an orange light blinking on your  monitor.  If your monitor falls off in less than 4 days, contact our Monitor department at [phone].  If your monitor becomes loose or falls off after 4 days call [employer] at [phone] for  suggestions on securing your monitor  Follow-Up: At [facility], you and your health needs are our priority.  As part of our continuing mission to provide you with exceptional heart care, we  have created designated Provider Care Teams.  These Care Teams include your primary Cardiologist (physician) and Advanced Practice Providers (APPs -  Physician Assistants and Nurse Practitioners) who all work together to provide you with the care you need, when you need it.  We recommend signing up for the patient portal called "MyChart".  Sign up information is provided on this After Visit Summary.  MyChart is used to connect with patients for Virtual Visits (Telemedicine).  Patients are able to view lab/test results, encounter notes, upcoming appointments, etc.  Non-urgent messages can be sent to your provider as well.   To learn more about what you can do with MyChart, go to [email or web address].    Your next appointment:   6 month(s)  Provider:   Jari Favre, PA-C, Robin Searing, NP, Jacolyn Reedy, PA-C, Eligha Bridegroom, NP, or Tereso Newcomer, PA-C      Then, Donato Schultz, MD will plan to see you again in 1 year(s).

## 2022-12-11 DIAGNOSIS — I4819 Other persistent atrial fibrillation: Secondary | ICD-10-CM | POA: Diagnosis not present

## 2022-12-25 DIAGNOSIS — G4733 Obstructive sleep apnea (adult) (pediatric): Secondary | ICD-10-CM | POA: Diagnosis not present

## 2022-12-25 DIAGNOSIS — F5104 Psychophysiologic insomnia: Secondary | ICD-10-CM | POA: Diagnosis not present

## 2022-12-31 DIAGNOSIS — I4819 Other persistent atrial fibrillation: Secondary | ICD-10-CM | POA: Diagnosis not present

## 2023-01-12 ENCOUNTER — Other Ambulatory Visit: Payer: Self-pay | Admitting: Interventional Radiology

## 2023-01-12 DIAGNOSIS — Z86718 Personal history of other venous thrombosis and embolism: Secondary | ICD-10-CM

## 2023-01-12 DIAGNOSIS — I871 Compression of vein: Secondary | ICD-10-CM

## 2023-01-28 ENCOUNTER — Other Ambulatory Visit: Payer: Self-pay | Admitting: Interventional Radiology

## 2023-01-28 DIAGNOSIS — Z86718 Personal history of other venous thrombosis and embolism: Secondary | ICD-10-CM

## 2023-01-28 DIAGNOSIS — I871 Compression of vein: Secondary | ICD-10-CM

## 2023-01-29 ENCOUNTER — Other Ambulatory Visit: Payer: Self-pay | Admitting: Urology

## 2023-01-29 DIAGNOSIS — D3000 Benign neoplasm of unspecified kidney: Secondary | ICD-10-CM

## 2023-02-11 ENCOUNTER — Other Ambulatory Visit: Payer: Self-pay | Admitting: Urology

## 2023-02-11 DIAGNOSIS — D49511 Neoplasm of unspecified behavior of right kidney: Secondary | ICD-10-CM

## 2023-02-12 DIAGNOSIS — G4700 Insomnia, unspecified: Secondary | ICD-10-CM | POA: Diagnosis not present

## 2023-02-12 DIAGNOSIS — I10 Essential (primary) hypertension: Secondary | ICD-10-CM | POA: Diagnosis not present

## 2023-02-12 DIAGNOSIS — I4891 Unspecified atrial fibrillation: Secondary | ICD-10-CM | POA: Diagnosis not present

## 2023-02-17 ENCOUNTER — Ambulatory Visit
Admission: RE | Admit: 2023-02-17 | Discharge: 2023-02-17 | Disposition: A | Discharge status: Home / Self Care | Payer: Medicare HMO | Source: Ambulatory Visit | Attending: Interventional Radiology | Admitting: Interventional Radiology

## 2023-02-17 DIAGNOSIS — Z86718 Personal history of other venous thrombosis and embolism: Secondary | ICD-10-CM

## 2023-02-17 DIAGNOSIS — I2699 Other pulmonary embolism without acute cor pulmonale: Secondary | ICD-10-CM | POA: Diagnosis not present

## 2023-02-17 DIAGNOSIS — I872 Venous insufficiency (chronic) (peripheral): Secondary | ICD-10-CM | POA: Diagnosis not present

## 2023-02-17 DIAGNOSIS — I871 Compression of vein: Secondary | ICD-10-CM

## 2023-02-18 ENCOUNTER — Ambulatory Visit
Admission: RE | Admit: 2023-02-18 | Discharge: 2023-02-18 | Disposition: A | Discharge status: Home / Self Care | Payer: Medicare HMO | Source: Ambulatory Visit | Attending: Interventional Radiology | Admitting: Interventional Radiology

## 2023-02-18 DIAGNOSIS — Z86718 Personal history of other venous thrombosis and embolism: Secondary | ICD-10-CM | POA: Diagnosis not present

## 2023-02-18 DIAGNOSIS — I871 Compression of vein: Secondary | ICD-10-CM | POA: Diagnosis not present

## 2023-02-18 HISTORY — PX: IR RADIOLOGIST EVAL & MGMT: IMG5224

## 2023-02-18 NOTE — Progress Notes (Signed)
Chief Complaint: RLE DVT, submassive PE   Referring Physician(s): Allred,Darrell K   History of Present Illness: Stanley Juarez is a 77 y.o. male presenting as a follow up to VIR clinic today.    Stanley Juarez joins [place] today via telemedicine visit.  We confirmed his identity with 2 personal identifiers.    History:  He was admitted to [facility] 12/24/2021 through the ED with acute SOB, and diagnosis of PE.  He was chopping wood at the time when he became symptomatic.     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     He was diagnosed with a provoked DVT, submassive PE based on his PE burden, vital signs, and biomarkers.  He underwent catheter directed thrombolysis with bilateral lysis catheters, and was discharged 12/28/2021 having started anticoagulation.    Venous duplex 12/26/21 showed + DVT (non-occlusive) of the right popliteal vein, gastroc vein, in addition to the SSV. Left was negative.    Repeat duplex 01/30/22 shows: Negative left  Similar right popliteal DVT, occlusive today on the study.  Tibial veins are patent.   Interval History: He tells me today that he is performing all of his activities fairly comfortably.  He does wear knee-high compression stockings most days.  This helps with reducing the swelling of his calves. He perceives that the swelling is fairly symmetric. He denies any new wounds, pigmentation/color change, induration/redness, worsening varicose veins.  He tells me the swelling is typically better by the morning.   He continues on xarelto.     His medical record has both metoprolol and diltiazem, which can both contribute to lower extremity edema.  We discussed this, but he should continue.    Updated venous duplex 02/17/23 of the right shows no DVT, with flow now continuous through the popliteal vein.         Past Medical History:  Diagnosis Date   Arthritis    back   Atrial fibrillation (HCC)    Cancer  (HCC)    Chronic kidney failure    DVT (deep venous thrombosis) (HCC)    History of pulmonary embolus (PE)    Hypertension    Pneumonia    Proteinuria    Sleep apnea     Past Surgical History:  Procedure Laterality Date   CARDIOVERSION N/A 09/29/2022   Procedure: CARDIOVERSION;  Surgeon: Pricilla Riffle, MD;  Location: [facility] ENDOSCOPY;  Service: Cardiovascular;  Laterality: N/A;   CATARACT EXTRACTION     IR ANGIOGRAM PULMONARY BILATERAL SELECTIVE  12/25/2021   IR INFUSION THROMBOL ARTERIAL INITIAL (MS)  12/25/2021   IR INFUSION THROMBOL ARTERIAL INITIAL (MS)  12/25/2021   IR RADIOLOGIST EVAL & MGMT  01/30/2022   IR THROMB F/U EVAL ART/VEN FINAL DAY (MS)  12/25/2021   IR US GUIDE VASC ACCESS RIGHT  12/25/2021   IR US GUIDE VASC ACCESS RIGHT  12/25/2021   MENISCUS REPAIR Right    ROBOT ASSISTED LAPAROSCOPIC NEPHRECTOMY Right 05/09/2022   Procedure: XI ROBOTIC ASSISTED LAPAROSCOPIC NEPHRECTOMY;  Surgeon: Rene Paci, MD;  Location: [facility] ORS;  Service: Urology;  Laterality: Right;  ONLY NEEDS 180 MIN   ROTATOR CUFF REPAIR Right    skin cancer removal      Allergies: Amiodarone, Prednisone, and Penicillins  Medications: Prior to Admission medications   Medication Sig Start Date End Date Taking? Authorizing Provider  ARTIFICIAL TEAR SOLUTION OP Place 1 drop into both eyes 2 (two)  times daily.    [provider]  diltiazem (CARDIZEM CD) 240 MG 24 hr capsule Take 1 capsule (240 mg total) by mouth daily. 09/26/22 07/27/23  Swinyer, Zachary George, NP  metoprolol tartrate (LOPRESSOR) 25 MG tablet Take 1 tablet (25 mg total) by mouth 2 (two) times daily. 09/26/22 09/27/23  Swinyer, Zachary George, NP  rivaroxaban (XARELTO) 20 MG TABS tablet Take 1 tablet (20 mg total) by mouth daily with supper. 01/18/22   Marinda Elk, MD  sodium chloride (NASAL MOIST) 0.65 % nasal spray Place 1 spray into the nose at bedtime.    [provider]  traZODone (DESYREL) 100 MG tablet Take 100  mg by mouth at bedtime. 12/03/21   [provider]     No family history on file.  Social History   Socioeconomic History   Marital status: Married    Spouse name: Not on file   Number of children: Not on file   Years of education: Not on file   Highest education level: Not on file  Occupational History   Not on file  Tobacco Use   Smoking status: Never   Smokeless tobacco: Never   Tobacco comments:    Never smoke 10/06/22   Vaping Use   Vaping Use: Never used  Substance and Sexual Activity   Alcohol use: Never   Drug use: Never   Sexual activity: Not on file  Other Topics Concern   Not on file  Social History Narrative   Not on file   Social Determinants of Health   Financial Resource Strain: Not on file  Food Insecurity: No Food Insecurity (08/28/2022)   Hunger Vital Sign    Worried About Running Out of Food in the Last Year: Never true    Ran Out of Food in the Last Year: Never true  Transportation Needs: No Transportation Needs (08/28/2022)   PRAPARE - Administrator, Civil Service (Medical): No    Lack of Transportation (Non-Medical): No  Physical Activity: Not on file  Stress: Not on file  Social Connections: Not on file      Review of Systems  Review of Systems: A 12 point ROS discussed and pertinent positives are indicated in the HPI above.  All other systems are negative.  Advance Care Plan: The advanced care plan/surrogate decision maker was discussed at the time of visit and documented in the medical record.    Physical Exam No direct physical exam was performed (except for noted visual exam findings with Video Visits).    Vital Signs: There were no vitals taken for this visit.  Imaging: US Venous Img Lower Unilateral Right (DVT)  Result Date: 02/17/2023 CLINICAL DATA:  77 year old male with a history of prior DVT, pulmonary embolism, chronic venous insufficiency symptoms EXAM: RIGHT LOWER EXTREMITY VENOUS DOPPLER ULTRASOUND  TECHNIQUE: Gray-scale sonography with graded compression, as well as color Doppler and duplex ultrasound were performed to evaluate the lower extremity deep venous systems from the level of the common femoral vein and including the common femoral, femoral, profunda femoral, popliteal and calf veins including the posterior tibial, peroneal and gastrocnemius veins when visible. The superficial great saphenous vein was also interrogated. Spectral Doppler was utilized to evaluate flow at rest and with distal augmentation maneuvers in the common femoral, femoral and popliteal veins. COMPARISON:  01/30/2022 FINDINGS: Contralateral Common Femoral Vein: Respiratory phasicity is normal and symmetric with the symptomatic side. No evidence of thrombus. Normal compressibility. Common Femoral Vein: No evidence of  thrombus. Normal compressibility, respiratory phasicity and response to augmentation. Saphenofemoral Junction: No evidence of thrombus. Normal compressibility and flow on color Doppler imaging. Profunda Femoral Vein: No evidence of thrombus. Normal compressibility and flow on color Doppler imaging. Femoral Vein: No evidence of thrombus. Normal compressibility, respiratory phasicity and response to augmentation. Popliteal Vein: No evidence of thrombus on the current study with complete compressibility and flow maintained. Calf Veins: No evidence of thrombus. Normal compressibility and flow on color Doppler imaging. Superficial Great Saphenous Vein: No evidence of thrombus. Normal compressibility and flow on color Doppler imaging. Other Findings:  None. IMPRESSION: Directed duplex of the right lower extremity negative for DVT. Signed, Yvone Neu. Miachel Roux, RPVI [facility] Radiology Electronically Signed   By: Gilmer Mor D.O.   On: 02/17/2023 13:08    Labs:  CBC: Recent Labs    08/28/22 0258 08/29/22 0505 09/26/22 1145 09/29/22 0943 11/03/22 0646  WBC  7.6 7.3 6.5  --  5.8  HGB 13.1 13.0 14.8 15.6 15.4  HCT 40.4 40.0 44.1 46.0 45.9  PLT 291 362 183  --  193    COAGS: Recent Labs    11/03/22 0646  INR 1.0    BMP: Recent Labs    08/26/22 1750 08/27/22 0420 08/28/22 0258 08/29/22 0505 09/26/22 1145 09/29/22 0943  NA 134* 134* 137 137 140 141  K 5.0 4.1 4.5 4.1 4.6 4.5  CL 102 104 108 107 107* 107  CO2 21* 22 21* 21* 20  --   GLUCOSE 159* 105* 114* 119* 85 106*  BUN 25* 19 18 26* 30* 26*  CALCIUM 8.5* 7.8* 8.6* 8.3* 8.9  --   CREATININE 1.83* 1.75* 1.65* 1.68* 1.68* 1.70*  GFRNONAA 38* 40* 43* 42*  --   --     LIVER FUNCTION TESTS: No results for input(s): "BILITOT", "AST", "ALT", "ALKPHOS", "PROT", "ALBUMIN" in the last 8760 hours.  TUMOR MARKERS: No results for input(s): "AFPTM", "CEA", "CA199", "CHROMGRNA" in the last 8760 hours.  Assessment and Plan:   Stanley Juarez is a very pleasant 77 yo male SP treatment of submassive PE with catheter directed thrombolysis 12/24/21, also with bilateral chronic venous insufficiency (CVI).   He is doing well.  His symptoms of chronic venous insufficiency are mostly from edema and pain/venous claudication at the end of the day.    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 He seems to be controlling his symptoms well with the use of knee-high compression stockings.  We discussed increasing the compression level, however, he is not very interested in this given that they are hard to use and uncomfortable.    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   This may be amenable to treatment with IVUS, PTA/stenting, however, given his symptoms and his current ongoing work up for presumed RCC, we decided this can wait.     At this time, I think continued medical care including compression stockings is the way to go. He agrees.     Plan: - 1 year follow up with Dr.Telitha Plath with office visit.  No imaging required.  - Continue medical care - He knows he can call us with any concerns  questions    Electronically Signed: Gilmer Mor 02/18/2023, 8:37 AM   I spent a total of    25 Minutes in remote  clinical consultation, greater than 50% of which was counseling/coordinating care for history of VTE with PE and dVT, chronic venous insufficiency. .    Visit type: Audio only (telephone). Audio (no video) only due to patient's lack of internet/smartphone capability. Alternative for in-person consultation at [facility], [address] [place], [place], [place]. This visit type was conducted due to national recommendations for restrictions regarding the COVID-19 Pandemic (e.g. social distancing).  This format is felt to be most appropriate for this patient at this time.  All issues noted in this document were discussed and addressed.

## 2023-02-20 ENCOUNTER — Ambulatory Visit
Admission: RE | Admit: 2023-02-20 | Discharge: 2023-02-20 | Disposition: A | Discharge status: Home / Self Care | Payer: Medicare HMO | Source: Ambulatory Visit | Attending: Urology | Admitting: Urology

## 2023-02-20 DIAGNOSIS — D49511 Neoplasm of unspecified behavior of right kidney: Secondary | ICD-10-CM

## 2023-02-20 DIAGNOSIS — N2889 Other specified disorders of kidney and ureter: Secondary | ICD-10-CM | POA: Diagnosis not present

## 2023-03-02 DIAGNOSIS — I10 Essential (primary) hypertension: Secondary | ICD-10-CM | POA: Diagnosis not present

## 2023-03-02 DIAGNOSIS — Z86711 Personal history of pulmonary embolism: Secondary | ICD-10-CM | POA: Diagnosis not present

## 2023-03-02 DIAGNOSIS — G4733 Obstructive sleep apnea (adult) (pediatric): Secondary | ICD-10-CM | POA: Diagnosis not present

## 2023-03-02 DIAGNOSIS — Z9989 Dependence on other enabling machines and devices: Secondary | ICD-10-CM | POA: Diagnosis not present

## 2023-03-02 DIAGNOSIS — D6869 Other thrombophilia: Secondary | ICD-10-CM | POA: Diagnosis not present

## 2023-03-02 DIAGNOSIS — R040 Epistaxis: Secondary | ICD-10-CM | POA: Diagnosis not present

## 2023-03-12 DIAGNOSIS — D3000 Benign neoplasm of unspecified kidney: Secondary | ICD-10-CM | POA: Diagnosis not present

## 2023-03-13 DIAGNOSIS — Z905 Acquired absence of kidney: Secondary | ICD-10-CM | POA: Diagnosis not present

## 2023-03-13 DIAGNOSIS — E875 Hyperkalemia: Secondary | ICD-10-CM | POA: Diagnosis not present

## 2023-03-13 DIAGNOSIS — I129 Hypertensive chronic kidney disease with stage 1 through stage 4 chronic kidney disease, or unspecified chronic kidney disease: Secondary | ICD-10-CM | POA: Diagnosis not present

## 2023-03-13 DIAGNOSIS — R040 Epistaxis: Secondary | ICD-10-CM | POA: Diagnosis not present

## 2023-03-13 DIAGNOSIS — R809 Proteinuria, unspecified: Secondary | ICD-10-CM | POA: Diagnosis not present

## 2023-03-13 DIAGNOSIS — N1832 Chronic kidney disease, stage 3b: Secondary | ICD-10-CM | POA: Diagnosis not present

## 2023-03-13 DIAGNOSIS — N2889 Other specified disorders of kidney and ureter: Secondary | ICD-10-CM | POA: Diagnosis not present

## 2023-03-13 LAB — LAB REPORT - SCANNED
Albumin, Urine POC: 5.5
Albumin/Creatinine Ratio, Urine, POC: 6
Creatinine, POC: 94 mg/dL
EGFR: 38

## 2023-03-18 ENCOUNTER — Ambulatory Visit (INDEPENDENT_AMBULATORY_CARE_PROVIDER_SITE_OTHER): Payer: Medicare HMO | Admitting: Otolaryngology

## 2023-03-18 ENCOUNTER — Encounter (INDEPENDENT_AMBULATORY_CARE_PROVIDER_SITE_OTHER): Payer: Self-pay | Admitting: Otolaryngology

## 2023-03-18 VITALS — BP 159/79 | HR 55 | Ht 70.0 in | Wt 240.0 lb

## 2023-03-18 DIAGNOSIS — J343 Hypertrophy of nasal turbinates: Secondary | ICD-10-CM

## 2023-03-18 DIAGNOSIS — J342 Deviated nasal septum: Secondary | ICD-10-CM

## 2023-03-18 DIAGNOSIS — Z7901 Long term (current) use of anticoagulants: Secondary | ICD-10-CM

## 2023-03-18 DIAGNOSIS — R040 Epistaxis: Secondary | ICD-10-CM

## 2023-03-18 DIAGNOSIS — R0981 Nasal congestion: Secondary | ICD-10-CM

## 2023-03-18 DIAGNOSIS — G4733 Obstructive sleep apnea (adult) (pediatric): Secondary | ICD-10-CM

## 2023-03-18 MED ORDER — AZELASTINE HCL 0.1 % NA SOLN: 2.0000 | Freq: Two times a day (BID) | NASAL | 12 refills | Status: AC

## 2023-03-18 MED ORDER — OXYMETAZOLINE HCL 0.05 % NA SOLN: 1.0000 | Freq: Two times a day (BID) | NASAL | 0 refills | Status: AC

## 2023-03-18 MED ORDER — SALINE SPRAY 0.65 % NA SOLN: 1.0000 | NASAL | 0 refills | Status: AC | PRN

## 2023-03-18 NOTE — Patient Instructions (Addendum)
-   use nasal saline spray x6/day and Vaseline twice a day to prevent nose bleeds - for active nose bleeds use Afrin and nasal tip pressure x 10 min to stop them - if nose bleed does not stop with above measures, please go to Emergency Room  - please see your primary care provider to check your blood pressure and make sure it is under control - return for recurrent nose bleeds and we will consider cautery of your nasal blood vessels   - Purchase BleedStop

## 2023-03-18 NOTE — Progress Notes (Signed)
ENT CONSULT:  Reason for Consult: recurrent epistaxis    HPI: Stanley Juarez is an 77 y.o. male with hx DVT and PE on chronic anticoagulation, hx of HTN and OSA, using CPAP and reports being compliant, s/p R nephrectomy for benign growth, hx of Afib on Xarelto, who is here for initial evaluation of recurrent epistaxis x 3 from the right side of his nose.  Last epistaxis was 3 days ago, but has been going on for 3 weeks on and off. Never had to go to ED or have his nose packed and uses toilet paper with Vaseline to stop the bleeding. Right side always. He has a full mask CPAP, it is humidified.  No history of nasal surgery.  Never had recurrent nosebleeds in the past.  Denies pain.  Denies coughing up blood, his epistaxis is always anterior and low-volume bleeding, which stops at home with a toilet paper/Vaseline packing he has been  using.   Records Reviewed:   Epic notes Had cardioversion for Afib 09/29/2022 EKG: 10/06/2022-sinus rhythm 60 mild first-degree AV block.  Cardiology note 12/05/22 Chronic anticoagulation - Continue with Xarelto.  Hemoglobin 15.4, creatinine 1.8. Watch for any signs of bleeding.   Obstructive sleep apnea - Sees sleep physician soon.  He has been taking a sleeping medication when he wakes up at 2 AM in the morning.   Recent Labs: 12/26/2021: ALT 19 12/27/2021: B Natriuretic Peptide 72.3 08/28/2022: TSH 2.413 08/29/2022: Magnesium 1.9 09/29/2022: BUN 26; Creatinine, Ser 1.70; Potassium 4.5; Sodium 141 11/03/2022: Hemoglobin 15.4; Platelets 193   DCCV on 09/29/22.  Was maintaining sinus rhythm on his office visit follow-up with Stanley Juarez.   Sepsis PNA 08/2022.  May have ultimately triggered the atrial fibrillation.   He has been feeling some fatigue, tiredness.  On both metoprolol 25 mg twice a day as well as Cardizem 240 mg a day.  Cardiology Note by Stanley Severance PA 10/06/2022 Stanley Juarez is a 77 y.o. male with a history of DVT/PE, HTN, OSA, right renal oncocytoma  s/p nephrectomy, atrial fibrillation who presents for consultation in the [facility].  Admission 1/2-08/30/22 for fever, cough, and shortness of breath, sent from urgent care for pneumonia. He tested positive for influenza A and CXR showed patchy airspace disease at L>R lung base, suspicious for pneumonia. Found to be in atrial flutter/fib in the ED with HR in the 120s to 130s, hs troponin 15. Admitted for treatment of sepsis secondary to PNA, influenza A. Patient is on Xarelto for a CHADS2VASC score of 3. He was discharged in afib with plan for outpatient DCCV. He was started on amiodarone as an outpatient but did not tolerate this medication. He is s/p DCCV on 09/29/22.   Today, patient remains in SR. He does feel that his stamina has increased since DCCV. He denies any bleeding issues on anticoagulation.    Today, he denies symptoms of palpitations, chest pain, shortness of breath, orthopnea, PND, lower extremity edema, dizziness, presyncope, syncope, snoring, daytime somnolence, bleeding, or neurologic sequela. The patient is tolerating medications without difficulties and is otherwise without complaint today.      Atrial Fibrillation Risk Factors:   he does have symptoms or diagnosis of sleep apnea. he is compliant with CPAP therapy. he does not have a history of rheumatic fever.     he has a BMI of Body mass index is 34.41 kg/m   Past Medical History:  Diagnosis Date   Arthritis    back  Atrial fibrillation (HCC)    Cancer (HCC)    Chronic kidney failure    DVT (deep venous thrombosis) (HCC)    History of pulmonary embolus (PE)    Hypertension    Pneumonia    Proteinuria    Sleep apnea     Past Surgical History:  Procedure Laterality Date   CARDIOVERSION N/A 09/29/2022   Procedure: CARDIOVERSION;  Surgeon: Pricilla Riffle, MD;  [facility]: [facility] ENDOSCOPY;  Service: Cardiovascular;  Laterality: N/A;   CATARACT EXTRACTION     IR ANGIOGRAM PULMONARY BILATERAL  SELECTIVE  12/25/2021   IR INFUSION THROMBOL ARTERIAL INITIAL (MS)  12/25/2021   IR INFUSION THROMBOL ARTERIAL INITIAL (MS)  12/25/2021   IR RADIOLOGIST EVAL & MGMT  01/30/2022   IR RADIOLOGIST EVAL & MGMT  02/18/2023   IR THROMB F/U EVAL ART/VEN FINAL DAY (MS)  12/25/2021   IR US GUIDE VASC ACCESS RIGHT  12/25/2021   IR US GUIDE VASC ACCESS RIGHT  12/25/2021   MENISCUS REPAIR Right    ROBOT ASSISTED LAPAROSCOPIC NEPHRECTOMY Right 05/09/2022   Procedure: XI ROBOTIC ASSISTED LAPAROSCOPIC NEPHRECTOMY;  Surgeon: Rene Paci, MD;  Location: [facility] ORS;  Service: Urology;  Laterality: Right;  ONLY NEEDS 180 MIN   ROTATOR CUFF REPAIR Right    skin cancer removal      Family History  Problem Relation Age of Onset   Cancer Brother    Cancer Brother     Social History:  reports that he has never smoked. He has never used smokeless tobacco. He reports that he does not drink alcohol and does not use drugs.  Allergies:  Allergies  Allergen Reactions   Amiodarone Swelling    Feet swelling, hypertension, dizziness, and constipation    Prednisone Other (See Comments)    Constipation   Penicillins Swelling and Rash    Did it involve swelling of the face/tongue/throat, SOB, or low BP? Yes Did it involve sudden or severe rash/hives, skin peeling, or any reaction on the inside of your mouth or nose? Yes Did you need to seek medical attention at a hospital or doctor's office? No When did it last happen?    childhood   If all above answers are "NO", may proceed with cephalosporin use.      Medications: I have reviewed the patient's current medications.   The PMH, PSH, Medications, Allergies, and SH were reviewed and updated.  ROS: Constitutional: Negative for fever, weight loss and weight gain. Cardiovascular: Negative for chest pain and dyspnea on exertion. Respiratory: Is not experiencing shortness of breath at rest. Gastrointestinal: Negative for nausea and  vomiting. Neurological: Negative for headaches. Psychiatric: The patient is not nervous/anxious  Blood pressure (!) 159/79, pulse (!) 55, height 5\' 10"  (1.778 m), weight 240 lb (108.9 kg), SpO2 98%.  PHYSICAL EXAM:  Exam: General: Well-developed, well-nourished Respiratory Respiratory effort: Equal inspiration and expiration without stridor Cardiovascular Peripheral Vascular: Warm extremities with equal color/perfusion Eyes: No nystagmus with equal extraocular motion bilaterally Neuro/Psych/Balance: Patient oriented to person, place, and time; Appropriate mood and affect; Gait is intact with no imbalance; Cranial nerves I-XII are intact Head and Face Inspection: Normocephalic and atraumatic without mass or lesion Palpation: Facial skeleton intact without bony stepoffs Salivary Glands: No mass or tenderness Facial Strength: Facial motility symmetric and full bilaterally ENT Pinna: External ear intact and fully developed External canal: Canal is patent with intact skin Tympanic Membrane: Clear and mobile External Nose: No scar or anatomic deformity.  Internal Nose: Septum intact  with left sided NSD, ITH. No polyp, or rhinorrhea. Area of healed mucosa and a prominent blood vessel along the mucosal aspect of the right lateral nasal wall/sill, several prominent blood vessels along the septum, patient identified the area along the lateral nasal wall/sill as the bleeding area, no crusting ulceration or blood/clot Lips, Teeth, and gums: Mucosa and teeth intact and viable TMJ: No pain to palpation with full mobility Oral cavity/oropharynx: No erythema or exudate, no lesions present. No blood or clot.  Nasopharynx: No mass or lesion with intact mucosa Neck Neck and Trachea: Midline trachea without mass or lesion Thyroid: No mass or nodularity Lymphatics: No lymphadenopathy  Procedure:   PROCEDURE NOTE: nasal endoscopy  Preoperative diagnosis: recurrent epistaxis   Postoperative  diagnosis: same  Procedure: Diagnostic nasal endoscopy (45409)  Surgeon: Ashok Croon, M.D.  Anesthesia: Topical lidocaine and Afrin  H&P REVIEW: The patient's history and physical were reviewed today prior to procedure. All medications were reviewed and updated as well. Complications: None Condition is stable throughout exam Indications and consent: The patient presents with symptoms of chronic sinusitis not responding to previous therapies. All the risks, benefits, and potential complications were reviewed with the patient preoperatively and informed consent was obtained. The time out was completed with confirmation of the correct procedure.   Procedure: The patient was seated upright in the clinic. Topical lidocaine and Afrin were applied to the nasal cavity. After adequate anesthesia had occurred, the rigid nasal endoscope was passed into the nasal cavity. The nasal mucosa, turbinates, septum, and sinus drainage pathways were visualized bilaterally. This revealed no purulence or significant secretions that might be cultured. There were no polyps or sites of significant inflammation. The mucosa was intact and there was no crusting present. The scope was then slowly withdrawn and the patient tolerated the procedure well. There were no complications or blood loss.      Studies Reviewed:none  Assessment/Plan: Encounter Diagnoses  Name Primary?   Epistaxis Yes   Anticoagulated    Obstructive sleep apnea    Nasal septal deviation    Hypertrophy of inferior nasal turbinate    Nasal congestion    77 year old male with history of DVT and PE history of A-fib currently on Xarelto history of OSA on CPAP therapy and compliant using a fullface mask, who presents with recurrent right-sided self-limiting low-volume epistaxis on and off for 3 weeks, which she has been treating with packing using toilet paper and Vaseline at home.  Denies history of severe epistaxis in the past never been to ED  or had his nose packed in the past.  Last episode of epistaxis 3 days ago.  Also reports chronic nasal congestion and states that he had used generic form of Flonase to relieve that at home.   Exam today including bilateral nasal endoscopy with septum intact but deviated to the left, ITH. No polyps, or purulence. Area of healed mucosa and a prominent blood vessel along the mucosal aspect of the right lateral nasal wall/sill, several prominent blood vessels along the septum, patient identified the area along the lateral nasal wall/sill as the bleeding area, no crusting ulceration or blood/clot.   We discussed at length his multiple factors for recurrent epistaxis which include CPAP therapy, history of hypertension, anticoagulation, and more recently use of Flonase.  I reviewed epistaxis prevention instructions below with the patient at length.  We decided to defer cautery today because she did not have any areas that with benefit from cautery will consider in the future  for recurrent epistaxis.  To help alleviate chronic nasal congestion I instructed the patient to switch to azelastine to avoid frequent nosebleeds in the future. Rx sent.  Epistaxis prevention instructions given to the patient: - use nasal saline spray x6/day and Vaseline twice a day to prevent nose bleeds - for active nose bleeds use Afrin and nasal tip pressure x 10 min to stop them - if nose bleed does not stop with above measures, please go to Emergency Room  - please see your primary care provider to check your blood pressure and make sure it is under control - return for recurrent nose bleeds and we will consider cautery of your nasal blood vessels  - Purchase BleedStop to have at home and help with epistaxis control   Thank you for allowing me to participate in the care of this patient. Please do not hesitate to contact me with any questions or concerns.   Ashok Croon, MD Otolaryngology [facility] Phone:  [phone] Fax: [phone]    03/18/2023, 6:47 PM

## 2023-03-19 DIAGNOSIS — D49511 Neoplasm of unspecified behavior of right kidney: Secondary | ICD-10-CM | POA: Diagnosis not present

## 2023-03-19 DIAGNOSIS — N1831 Chronic kidney disease, stage 3a: Secondary | ICD-10-CM | POA: Diagnosis not present

## 2023-03-27 DIAGNOSIS — I10 Essential (primary) hypertension: Secondary | ICD-10-CM | POA: Diagnosis not present

## 2023-03-27 DIAGNOSIS — R7303 Prediabetes: Secondary | ICD-10-CM | POA: Diagnosis not present

## 2023-03-27 DIAGNOSIS — G4733 Obstructive sleep apnea (adult) (pediatric): Secondary | ICD-10-CM | POA: Diagnosis not present

## 2023-03-27 DIAGNOSIS — M109 Gout, unspecified: Secondary | ICD-10-CM | POA: Diagnosis not present

## 2023-03-27 DIAGNOSIS — Z0000 Encounter for general adult medical examination without abnormal findings: Secondary | ICD-10-CM | POA: Diagnosis not present

## 2023-03-27 DIAGNOSIS — E669 Obesity, unspecified: Secondary | ICD-10-CM | POA: Diagnosis not present

## 2023-03-27 DIAGNOSIS — Z23 Encounter for immunization: Secondary | ICD-10-CM | POA: Diagnosis not present

## 2023-03-27 DIAGNOSIS — D6869 Other thrombophilia: Secondary | ICD-10-CM | POA: Diagnosis not present

## 2023-03-27 DIAGNOSIS — G4700 Insomnia, unspecified: Secondary | ICD-10-CM | POA: Diagnosis not present

## 2023-03-27 DIAGNOSIS — I4891 Unspecified atrial fibrillation: Secondary | ICD-10-CM | POA: Diagnosis not present

## 2023-03-27 LAB — LAB REPORT - SCANNED: A1c: 5.4

## 2023-05-05 DIAGNOSIS — G4733 Obstructive sleep apnea (adult) (pediatric): Secondary | ICD-10-CM | POA: Diagnosis not present

## 2023-05-05 DIAGNOSIS — M109 Gout, unspecified: Secondary | ICD-10-CM | POA: Diagnosis not present

## 2023-05-05 DIAGNOSIS — N1832 Chronic kidney disease, stage 3b: Secondary | ICD-10-CM | POA: Diagnosis not present

## 2023-05-05 DIAGNOSIS — E669 Obesity, unspecified: Secondary | ICD-10-CM | POA: Diagnosis not present

## 2023-05-05 DIAGNOSIS — I10 Essential (primary) hypertension: Secondary | ICD-10-CM | POA: Diagnosis not present

## 2023-05-05 DIAGNOSIS — N183 Chronic kidney disease, stage 3 unspecified: Secondary | ICD-10-CM | POA: Diagnosis not present

## 2023-05-05 DIAGNOSIS — Z6835 Body mass index (BMI) 35.0-35.9, adult: Secondary | ICD-10-CM | POA: Diagnosis not present

## 2023-06-01 ENCOUNTER — Encounter: Payer: Self-pay | Admitting: Cardiology

## 2023-06-01 ENCOUNTER — Ambulatory Visit: Payer: Medicare HMO | Attending: Cardiology | Admitting: Cardiology

## 2023-06-01 VITALS — BP 146/80 | HR 55 | Ht 70.0 in | Wt 243.0 lb

## 2023-06-01 DIAGNOSIS — Z79899 Other long term (current) drug therapy: Secondary | ICD-10-CM | POA: Diagnosis not present

## 2023-06-01 DIAGNOSIS — D6869 Other thrombophilia: Secondary | ICD-10-CM

## 2023-06-01 DIAGNOSIS — I10 Essential (primary) hypertension: Secondary | ICD-10-CM

## 2023-06-01 DIAGNOSIS — I4819 Other persistent atrial fibrillation: Secondary | ICD-10-CM | POA: Diagnosis not present

## 2023-06-01 NOTE — Patient Instructions (Signed)
Medication Instructions:  The current medical regimen is effective;  continue present plan and medications.  *If you need a refill on your cardiac medications before your next appointment, please call your pharmacy*  Lab Work: Please have blood work today   (BMP)  If you have labs (blood work) drawn today and your tests are completely normal, you will receive your results only by: MyChart Message (if you have MyChart) OR A paper copy in the mail If you have any lab test that is abnormal or we need to change your treatment, we will call you to review the results.  Follow-Up: At [facility], you and your health needs are our priority.  As part of our continuing mission to provide you with exceptional heart care, we have created designated Provider Care Teams.  These Care Teams include your primary Cardiologist (physician) and Advanced Practice Providers (APPs -  Physician Assistants and Nurse Practitioners) who all work together to provide you with the care you need, when you need it.  We recommend signing up for the patient portal called "MyChart".  Sign up information is provided on this After Visit Summary.  MyChart is used to connect with patients for Virtual Visits (Telemedicine).  Patients are able to view lab/test results, encounter notes, upcoming appointments, etc.  Non-urgent messages can be sent to your provider as well.   To learn more about what you can do with MyChart, go to [email or web address].    Your next appointment:   1 year(s)  Provider:   Donato Schultz, MD

## 2023-06-01 NOTE — Progress Notes (Signed)
Cardiology Office Note:  .   Date:  06/01/2023  ID:  Stanley Juarez, DOB [birth year], MRN [id number] PCP: Lupita Raider, MD  [facility] Providers Cardiologist:  Donato Schultz, MD     History of Present Illness: .   Stanley Juarez is a 77 y.o. 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 The patient underwent catheter-directed thrombolysis in May 2023, right nephrectomy, and DC cardioversion in February 2024. He is currently on Xarelto, Diltiazem 240 mg daily, and Metoprolol 25 mg twice a day.  The patient reports feeling generally well, with some back discomfort managed with muscle relaxants. He has been maintaining an active lifestyle, walking about a mile daily, and has embarked on a weight loss plan aiming to lose 26 pounds in six months. He also reports some swelling in the ankles, which he manages by propping his feet up when sitting.  The patient's spouse mentions a recent concern about elevated potassium levels, which led to dietary changes, including reduced intake of high-potassium foods like potatoes and tomatoes. The patient has been consuming about 80 ounces of water daily and is on a probiotic following bowel issues after a hospital stay. He requests a lab test to check current potassium levels.          ROS:No CP, no SOB  Studies Reviewed: .        Results LABS Potassium: 4.5 (09/29/2022)  Risk Assessment/Calculations:           Physical Exam:   VS:  BP (!) 146/80   Pulse (!) 55   Ht 5\' 10"  (1.778 m)   Wt 243 lb (110.2 kg)   SpO2 98%   BMI 34.87 kg/m    Wt Readings from Last 3 Encounters:  06/01/23 243 lb (110.2 kg)  03/18/23 240 lb (108.9 kg)  12/05/22 242 lb (109.8 kg)    GEN: Well  nourished, well developed in no acute distress NECK: No JVD; No carotid bruits CARDIAC: RRR, no murmurs, no rubs, no gallops RESPIRATORY:  Clear to auscultation without rales, wheezing or rhonchi  ABDOMEN: Soft, non-tender, non-distended EXTREMITIES:  No edema; No deformity   ASSESSMENT AND PLAN: .    Assessment and Plan    Parox Atrial Fibrillation Maintaining sinus rhythm on Diltiazem 240mg  daily and Metoprolol 25mg  twice daily. No symptoms of heart failure. -Continue current medications. -Annual follow-up unless symptoms change.  Chronic Venous Insufficiency No current symptoms of fluid overload despite high fluid intake to help promote weight loss. -Continue current management strategies including elevating legs when sitting.  Hyperkalemia Previous high potassium level, possibly related to diet. Recent levels within normal range. -Check potassium level today. -Continue current dietary modifications.  Pulmonary Embolism History of submassive PE treated with catheter-directed thrombolysis. Currently on Xarelto. -Continue Xarelto.  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 No current issues reported. -No specific plan needed at this time.  General Health Maintenance -Continue current lifestyle modifications including daily walking and weight loss efforts. -Annual follow-up unless symptoms change.             Signed, Donato Schultz, MD

## 2023-06-02 ENCOUNTER — Encounter: Payer: Self-pay | Admitting: *Deleted

## 2023-06-02 LAB — BASIC METABOLIC PANEL
BUN/Creatinine Ratio: 17 (ref 10–24)
BUN: 28 mg/dL — ABNORMAL HIGH (ref 8–27)
CO2: 24 mmol/L (ref 20–29)
Calcium: 9.6 mg/dL (ref 8.6–10.2)
Chloride: 105 mmol/L (ref 96–106)
Creatinine, Ser: 1.65 mg/dL — ABNORMAL HIGH (ref 0.76–1.27)
Glucose: 100 mg/dL — ABNORMAL HIGH (ref 70–99)
Potassium: 4.9 mmol/L (ref 3.5–5.2)
Sodium: 141 mmol/L (ref 134–144)
eGFR: 43 mL/min/{1.73_m2} — ABNORMAL LOW (ref >59)

## 2023-07-14 DIAGNOSIS — N1832 Chronic kidney disease, stage 3b: Secondary | ICD-10-CM | POA: Diagnosis not present

## 2023-07-14 DIAGNOSIS — Z905 Acquired absence of kidney: Secondary | ICD-10-CM | POA: Diagnosis not present

## 2023-07-14 DIAGNOSIS — R040 Epistaxis: Secondary | ICD-10-CM | POA: Diagnosis not present

## 2023-07-14 DIAGNOSIS — R809 Proteinuria, unspecified: Secondary | ICD-10-CM | POA: Diagnosis not present

## 2023-07-14 DIAGNOSIS — I129 Hypertensive chronic kidney disease with stage 1 through stage 4 chronic kidney disease, or unspecified chronic kidney disease: Secondary | ICD-10-CM | POA: Diagnosis not present

## 2023-07-14 DIAGNOSIS — N2889 Other specified disorders of kidney and ureter: Secondary | ICD-10-CM | POA: Diagnosis not present

## 2023-07-14 DIAGNOSIS — E875 Hyperkalemia: Secondary | ICD-10-CM | POA: Diagnosis not present

## 2023-07-15 LAB — LAB REPORT - SCANNED
Albumin, Urine POC: 5.9
Creatinine, POC: 107.6 mg/dL
EGFR: 44
Microalb Creat Ratio: 5

## 2023-07-22 ENCOUNTER — Other Ambulatory Visit: Payer: Self-pay | Admitting: Urology

## 2023-07-22 DIAGNOSIS — D49511 Neoplasm of unspecified behavior of right kidney: Secondary | ICD-10-CM

## 2023-07-22 DIAGNOSIS — D3000 Benign neoplasm of unspecified kidney: Secondary | ICD-10-CM

## 2023-08-17 ENCOUNTER — Encounter: Payer: Self-pay | Admitting: Urology

## 2023-08-21 ENCOUNTER — Other Ambulatory Visit: Payer: Self-pay | Admitting: Nurse Practitioner

## 2023-08-29 ENCOUNTER — Ambulatory Visit
Admission: RE | Admit: 2023-08-29 | Discharge: 2023-08-29 | Disposition: A | Discharge status: Home / Self Care | Payer: Medicare HMO | Source: Ambulatory Visit | Attending: Urology | Admitting: Urology

## 2023-08-29 DIAGNOSIS — K862 Cyst of pancreas: Secondary | ICD-10-CM | POA: Diagnosis not present

## 2023-08-29 DIAGNOSIS — D3000 Benign neoplasm of unspecified kidney: Secondary | ICD-10-CM

## 2023-08-29 DIAGNOSIS — D49511 Neoplasm of unspecified behavior of right kidney: Secondary | ICD-10-CM

## 2023-08-29 DIAGNOSIS — Z905 Acquired absence of kidney: Secondary | ICD-10-CM | POA: Diagnosis not present

## 2023-08-29 DIAGNOSIS — Z85528 Personal history of other malignant neoplasm of kidney: Secondary | ICD-10-CM | POA: Diagnosis not present

## 2023-08-29 DIAGNOSIS — N2889 Other specified disorders of kidney and ureter: Secondary | ICD-10-CM | POA: Diagnosis not present

## 2023-09-11 DIAGNOSIS — D49512 Neoplasm of unspecified behavior of left kidney: Secondary | ICD-10-CM | POA: Diagnosis not present

## 2023-09-14 ENCOUNTER — Ambulatory Visit: Payer: Medicare HMO | Admitting: Pulmonary Disease

## 2023-09-14 ENCOUNTER — Encounter: Payer: Self-pay | Admitting: Pulmonary Disease

## 2023-09-14 VITALS — BP 132/74 | HR 57 | Temp 97.9°F | Ht 70.5 in | Wt 247.4 lb

## 2023-09-14 DIAGNOSIS — Z86711 Personal history of pulmonary embolism: Secondary | ICD-10-CM

## 2023-09-14 DIAGNOSIS — I2699 Other pulmonary embolism without acute cor pulmonale: Secondary | ICD-10-CM | POA: Diagnosis not present

## 2023-09-14 NOTE — Patient Instructions (Signed)
No changes to medication  Think about using Flonase once a day for a week or 2 if the congestion on that side of the nose gets worse  I am okay for your primary doctor to continue to refill the Xarelto  Return to clinic as needed, please contact me if I can help in any way

## 2023-09-14 NOTE — Progress Notes (Signed)
@Patient  ID: Stanley Juarez, male    DOB: 1946/05/02, 78 y.o.   MRN: [id number]  Chief Complaint  Patient presents with   Follow-up    Still uses cpap- sleeps good with it Runny nose at night     Referring provider: Lupita Raider, MD  HPI:   78 y.o. man whom we are seeing in follow up for evaluation of provoked submassive pulmonary embolus.  Most recent cardiology note x 2 reviewed.  Most recent IR note x 2 reviewed.  Doing well. Reports good adherence to Xarelto.  Again, reviewed TTE 03/2022 shows normal RV size, function, estimated RVSP/PASP.  Tolerating Xarelto well.    No issues with hematemesis, melena, hematochezia.  Discussed lifelong anticoagulation which he is amenable to.  Several questions answered to the best my ability.  He reports good adherence to CPAP.  Managed at a different office.  Some nasal congestion on the left.  Thinks is due to dry environment, running heat with the cold weather.  HPI at initial visit: Patient was splitting wood.  Toss long toward splitter about 15 feet away.  Had sudden onset of chest pressure/pain.  Substernal.  This seemed to resolve after a few moments.  He thought it was just a muscle strain.  Later that evening he developed significant dyspnea, really shortness of breath at rest.  Cannot sleep due to how short of breath he was.  This prompted presentation to the ED.  There CTA PE protocol was performed on my review interpretation shows significant clot burden, otherwise clear lungs.  RV to LV ratio was mildly increased.  BNP and troponin both elevated.  Consistent with submassive pulmonary embolus.  Unfortunate, no echocardiogram was done.  He was evaluated by IR and underwent catheter directed lytic therapy.  He was on heparin in the hospital and transition to Xarelto.  He completed loading dose.  Continues on Xarelto 20 mg daily.  He denies any issues with bleeding.  No hematochezia or melena.  No hematuria.  No hematemesis or  hemoptysis.  Reviewed lower extremity Dopplers 12/2021 that demonstrated significant right-sided DVT.  The Dopplers were repeated 01/2022 by hematology and showed resolution of most clot with residual popliteal clot.  The CTA PE protocol did incidentally find renal mass on the right with nodularity on the left.  This prompted urology referral.  Ongoing work-up.  High suspicion for cancer which has been shared with him.  Likely predisposing factor to blood clot which was discussed today.  Plan for surgery after 3 months of anticoagulation.  Sounds like you need a right total nephrectomy.  PMH: Hypertension Surgical history: Skin cancer removal Family history: Family History  Problem Relation Age of Onset   Cancer Brother    Cancer Brother    Social history: Never smoker, [place]  Questionaires / Pulmonary Flowsheets:   ACT:      No data to display          MMRC:     No data to display          Epworth:      No data to display          Tests:   FENO:  No results found for: "NITRICOXIDE"  PFT:     No data to display          WALK:     12/30/2021    3:17 PM  SIX MIN WALK  Supplimental Oxygen during Test? (L/min) No  Tech Comments: pt  walked at an average pace completing all required laps denying any complaints of SOB.    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 Follow-up of left renal masses. 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 EXAM: MRI ABDOMEN WITHOUT CONTRAST TECHNIQUE: Multiplanar multisequence MR imaging was performed without the administration of intravenous contrast. COMPARISON:  02/20/2023 FINDINGS: Lower chest: Normal heart size. Hepatobiliary: Normal noncontrast appearance of the liver, gallbladder, biliary tract. Pancreas:  Pancreatic head 7 mm  cystic lesion is similar on 02/07. Spleen:  Normal in size, without focal abnormality. Adrenals/Urinary Tract: Normal adrenal glands. Right nephrectomy, without local recurrence. The lateral interpolar left renal lesion is heterogeneously mildly T2 hyperintense and relatively T1 isointense. Measures 3.2 x 2.8 cm on 16/8 versus 3.0 x 2.8 cm on the prior exam, suggest stability. 2.7 cm craniocaudal on coronal image 12/3 today versus similar on the prior (when remeasured). Other subcentimeter T2 hyperintense left renal lesions are likely cysts but incompletely characterized. No hydronephrosis. Stomach/Bowel: Normal stomach and abdominal bowel loops. Vascular/Lymphatic: Normal aortic caliber. No abdominal adenopathy. Other:  No ascites. Musculoskeletal: No acute osseous abnormality. IMPRESSION: 1. Similar size of the lateral interpolar left renal lesion which is incompletely characterized on this noncontrast exam. 2. Right nephrectomy, without recurrent disease. 3. 7 mm pancreatic head cystic lesion is unchanged and has been present back to 2009, considered benign. Electronically Signed   By: Jeronimo Greaves M.D.   On: 09/07/2023 10:45    Lab Results: Personally reviewed CBC    Component Value Date/Time   WBC 5.8 11/03/2022 0646   RBC 4.99 11/03/2022 0646   HGB 15.4 11/03/2022 0646   HGB 14.8 09/26/2022 1145   HCT 45.9 11/03/2022 0646   HCT 44.1 09/26/2022 1145   PLT 193 11/03/2022 0646   PLT 183 09/26/2022 1145   MCV 92.0 11/03/2022 0646   MCV 91 09/26/2022 1145   MCH 30.9 11/03/2022 0646   MCHC 33.6 11/03/2022 0646   RDW 14.1 11/03/2022 0646   RDW 14.0 09/26/2022 1145    BMET    Component Value Date/Time   NA 141 06/01/2023 0926   K 4.9 06/01/2023 0926   CL 105 06/01/2023 0926   CO2 24 06/01/2023 0926   GLUCOSE 100 (H) 06/01/2023 0926   GLUCOSE 106 (H) 09/29/2022 0943   BUN 28 (H) 06/01/2023 0926   CREATININE 1.65 (H) 06/01/2023 0926   CALCIUM 9.6 06/01/2023 0926   GFRNONAA 42 (L)  08/29/2022 0505    BNP    Component Value Date/Time   BNP 72.3 12/27/2021 0524    ProBNP No results found for: "PROBNP"  Specialty Problems       Pulmonary Problems   Influenza A with pneumonia   Sepsis due to pneumonia [facility])   Sleep apnea    Allergies  Allergen Reactions   Amiodarone Swelling    Feet swelling, hypertension, dizziness, and constipation    Prednisone Other (See Comments)    Constipation   Penicillins Swelling and Rash    Did it involve swelling of the face/tongue/throat, SOB, or low BP? Yes Did it involve sudden or severe rash/hives, skin peeling, or any reaction on the inside of your mouth or nose? Yes Did you need to seek medical attention at a hospital or doctor's office? No When did it last happen?    childhood   If all above  answers are "NO", may proceed with cephalosporin use.      Immunization History  Administered Date(s) Administered   Moderna Sars-Covid-2 Vaccination 10/07/2019, 11/02/2019   Pneumococcal Conjugate-13 08/03/2013   Pneumococcal Polysaccharide-23 08/28/2014   Td 08/25/1997   Tdap 07/26/2012   Zoster, Live 07/26/2012    Past Medical History:  Diagnosis Date   Arthritis    back   Atrial fibrillation (HCC)    Cancer (HCC)    Chronic kidney failure    DVT (deep venous thrombosis) (HCC)    History of pulmonary embolus (PE)    Hypertension    Pneumonia    Proteinuria    Sleep apnea     Tobacco History: Social History   Tobacco Use  Smoking Status Never  Smokeless Tobacco Never  Tobacco Comments   Never smoke 10/06/22    Counseling given: Not Answered Tobacco comments: Never smoke 10/06/22    Continue to not smoke  Outpatient Encounter Medications as of 09/14/2023  Medication Sig   ARTIFICIAL TEAR SOLUTION OP Place 1 drop into both eyes 2 (two) times daily.   azelastine (ASTELIN) 0.1 % nasal spray Place 2 sprays into both nostrils 2 (two) times daily. Use in each nostril as directed   cyclobenzaprine  (FLEXERIL) 5 MG tablet Take 5 mg by mouth as needed for muscle spasms.   diltiazem (CARDIZEM CD) 240 MG 24 hr capsule TAKE 1 CAPSULE EVERY DAY   metoprolol tartrate (LOPRESSOR) 25 MG tablet TAKE 1 TABLET TWICE DAILY   oxymetazoline (AFRIN NASAL SPRAY) 0.05 % nasal spray Place 1 spray into both nostrils 2 (two) times daily. Do not use Afrin longer than 3 days and use it only for epistaxis   rivaroxaban (XARELTO) 20 MG TABS tablet Take 1 tablet (20 mg total) by mouth daily with supper.   sodium chloride (OCEAN) 0.65 % SOLN nasal spray Place 1 spray into both nostrils as needed for congestion.   traZODone (DESYREL) 100 MG tablet Take 100 mg by mouth at bedtime.   No facility-administered encounter medications on file as of 09/14/2023.     Review of Systems  Review of Systems  N/a Physical Exam  BP 132/74 (BP Location: Left Arm, Patient Position: Sitting, Cuff Size: Normal)   Pulse (!) 57   Temp 97.9 F (36.6 C) (Oral)   Ht 5' 10.5" (1.791 m)   Wt 247 lb 6.4 oz (112.2 kg)   SpO2 98%   BMI 35.00 kg/m   Wt Readings from Last 5 Encounters:  09/14/23 247 lb 6.4 oz (112.2 kg)  06/01/23 243 lb (110.2 kg)  03/18/23 240 lb (108.9 kg)  12/05/22 242 lb (109.8 kg)  11/03/22 240 lb (108.9 kg)    BMI Readings from Last 5 Encounters:  09/14/23 35.00 kg/m  06/01/23 34.87 kg/m  03/18/23 34.44 kg/m  12/05/22 34.72 kg/m  11/03/22 34.44 kg/m     Physical Exam General: Well-appearing, no acute distress Eyes: EOMI, icterus Neck: Supple, no JVP Pulmonary: Clear, normal work of breathing Cardiovascular: Regular rhythm, warm Abdomen: Nondistended, bowel sounds present MSK: No synovitis, joint effusion Neuro: Normal gait, no weakness Psych: Normal mood, full affect   Assessment & Plan:   Submassive pulmonary embolism:  Troponin and BNP elevated.  Slightly increased RV to LV ratio on CT scan.  Unfortunately no echocardiogram was performed.  Underwent catheter directed lytics.   Symptoms improved.  Continue on Xarelto 20 mg daily.  Recommend indefinite anticoagulation.  TTE 03/2022, 3 months after event with normal RV size,  function, estimated RVSP.  This is encouraging.  I agree with indefinite anticoagulation.  Some thought of possible provoked in the setting of kidney tumor this is a oncocytoma, unclear how thrombogenic this is.  He is doing well.  No further surgeries planned.  Again we agreed on lifelong anticoagulation.  Okay for PCP to fill this moving forward.  Happy to reengage if additional pulmonary needs arise.    Return if symptoms worsen or fail to improve.   Karren Burly, MD 09/14/2023

## 2023-09-29 DIAGNOSIS — N183 Chronic kidney disease, stage 3 unspecified: Secondary | ICD-10-CM | POA: Diagnosis not present

## 2023-09-29 DIAGNOSIS — G4700 Insomnia, unspecified: Secondary | ICD-10-CM | POA: Diagnosis not present

## 2023-09-29 DIAGNOSIS — E669 Obesity, unspecified: Secondary | ICD-10-CM | POA: Diagnosis not present

## 2023-09-29 DIAGNOSIS — R7301 Impaired fasting glucose: Secondary | ICD-10-CM | POA: Diagnosis not present

## 2023-09-29 DIAGNOSIS — I4891 Unspecified atrial fibrillation: Secondary | ICD-10-CM | POA: Diagnosis not present

## 2023-09-29 DIAGNOSIS — I10 Essential (primary) hypertension: Secondary | ICD-10-CM | POA: Diagnosis not present

## 2023-09-29 DIAGNOSIS — R7303 Prediabetes: Secondary | ICD-10-CM | POA: Diagnosis not present

## 2023-11-12 DIAGNOSIS — E875 Hyperkalemia: Secondary | ICD-10-CM | POA: Diagnosis not present

## 2023-11-12 DIAGNOSIS — N1832 Chronic kidney disease, stage 3b: Secondary | ICD-10-CM | POA: Diagnosis not present

## 2023-11-12 DIAGNOSIS — R809 Proteinuria, unspecified: Secondary | ICD-10-CM | POA: Diagnosis not present

## 2023-11-12 DIAGNOSIS — R040 Epistaxis: Secondary | ICD-10-CM | POA: Diagnosis not present

## 2023-11-12 DIAGNOSIS — N2889 Other specified disorders of kidney and ureter: Secondary | ICD-10-CM | POA: Diagnosis not present

## 2023-11-12 DIAGNOSIS — I129 Hypertensive chronic kidney disease with stage 1 through stage 4 chronic kidney disease, or unspecified chronic kidney disease: Secondary | ICD-10-CM | POA: Diagnosis not present

## 2023-11-12 DIAGNOSIS — Z905 Acquired absence of kidney: Secondary | ICD-10-CM | POA: Diagnosis not present

## 2023-11-13 LAB — LAB REPORT - SCANNED
Albumin, Urine POC: 31.9
Creatinine, POC: 151.6 mg/dL
EGFR: 42
Microalb Creat Ratio: 21

## 2023-11-19 ENCOUNTER — Encounter: Payer: Self-pay | Admitting: Nephrology

## 2023-12-18 ENCOUNTER — Other Ambulatory Visit: Payer: Self-pay | Admitting: Interventional Radiology

## 2023-12-18 DIAGNOSIS — D3002 Benign neoplasm of left kidney: Secondary | ICD-10-CM

## 2023-12-28 ENCOUNTER — Ambulatory Visit (HOSPITAL_COMMUNITY)
Admission: RE | Admit: 2023-12-28 | Discharge: 2023-12-28 | Disposition: A | Discharge status: Home / Self Care | Source: Ambulatory Visit | Attending: Interventional Radiology | Admitting: Interventional Radiology

## 2023-12-28 DIAGNOSIS — Z905 Acquired absence of kidney: Secondary | ICD-10-CM | POA: Diagnosis not present

## 2023-12-28 DIAGNOSIS — K5730 Diverticulosis of large intestine without perforation or abscess without bleeding: Secondary | ICD-10-CM | POA: Diagnosis not present

## 2023-12-28 DIAGNOSIS — D3002 Benign neoplasm of left kidney: Secondary | ICD-10-CM | POA: Insufficient documentation

## 2023-12-28 MED ORDER — GADOBUTROL 1 MMOL/ML IV SOLN
10.0000 mL | Freq: Once | INTRAVENOUS | Status: AC | PRN
  Administered 2023-12-28: 10 mL via INTRAVENOUS

## 2023-12-30 IMAGING — CT CT HEAD WO/W CM
3 of 4 series · 14 of 47 positions shown, 16 images · IV contrast (agent unspecified)
Comparison: None Available.

CLINICAL DATA: Brain metastases suspected

EXAM:
CT HEAD WITHOUT AND WITH CONTRAST
TECHNIQUE: Contiguous axial images were obtained from the base of the skull
through the vertex without and with intravenous contrast.

[Series 2: head wo · axial · 0.47mm/px · z∈[-136,-6]mm · 8 of 32 slices shown, 10 images]
[im 3/32  brain]
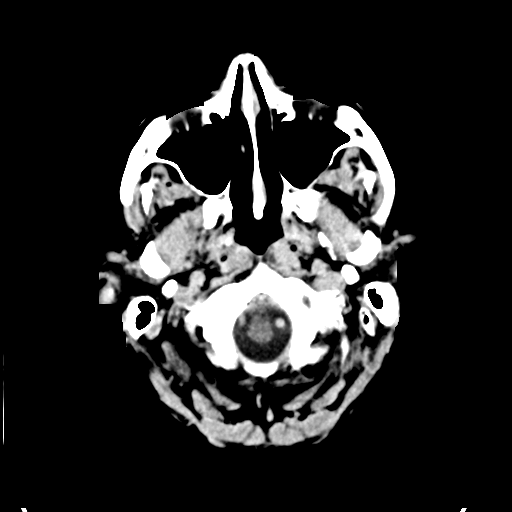
[im 3/32  bone]
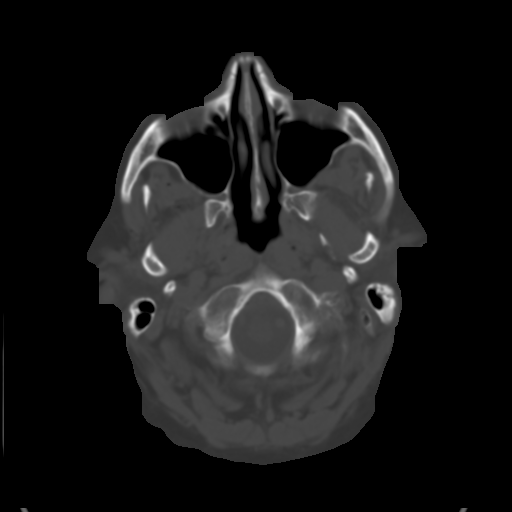
[im 7/32  brain]
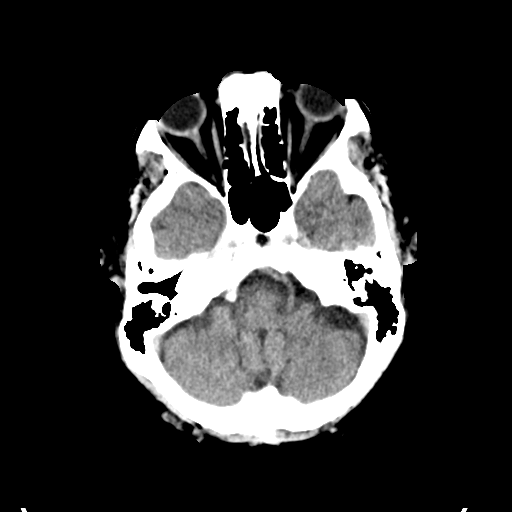
[im 12/32  brain]
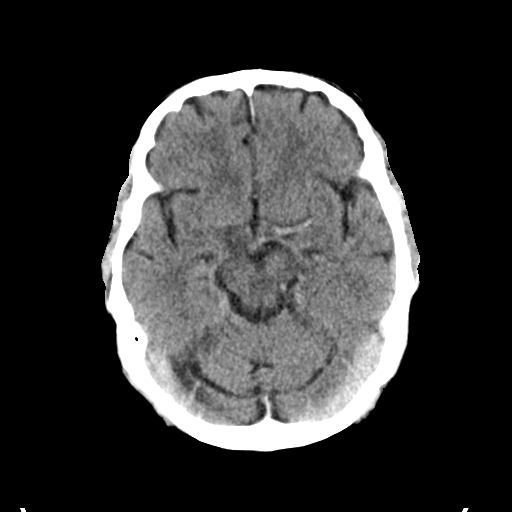
[im 14/32  brain]
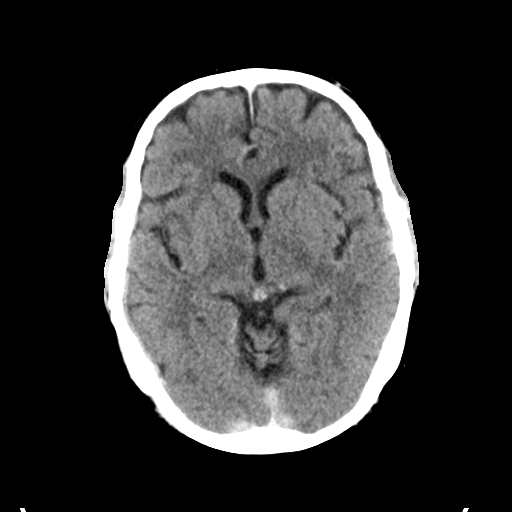
[im 18/32  brain]
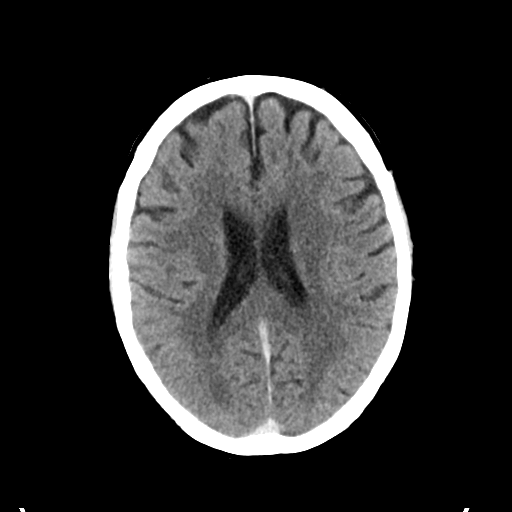
[im 18/32  bone]
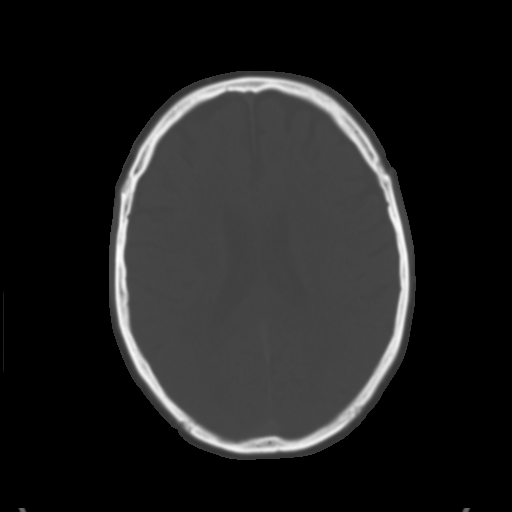
[im 20/32  brain]
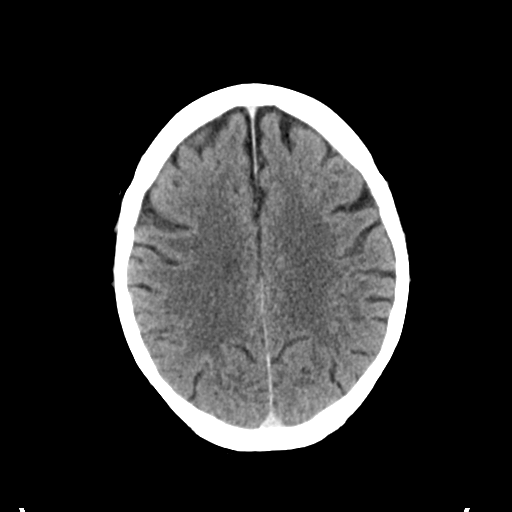
[im 25/32  brain]
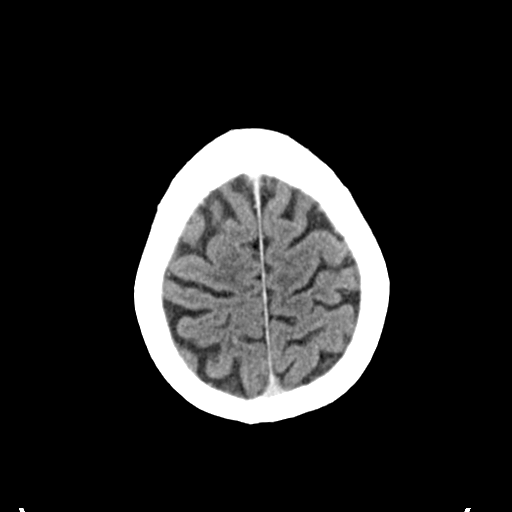
[im 29/32  brain]
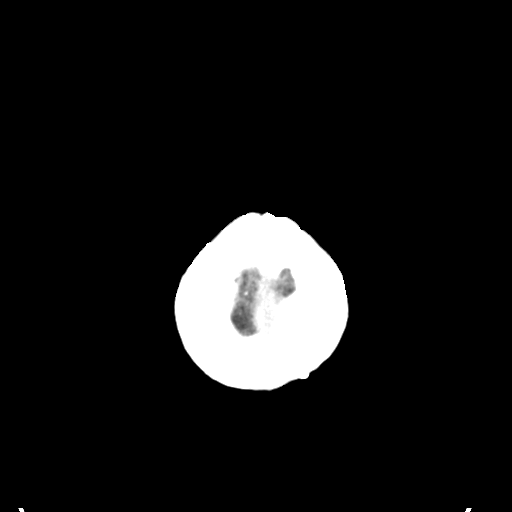

[Series 5: coronal soft tissue · coronal · 0.34mm/px · 3 of 65 slices shown]
[im 22/65  brain]
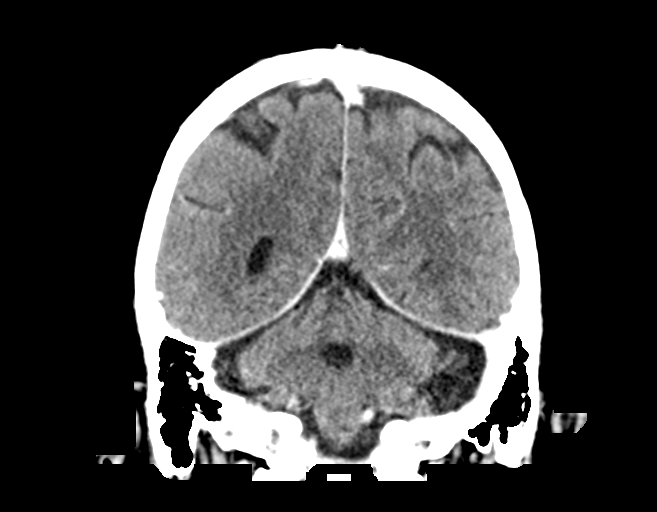
[im 29/65  brain]
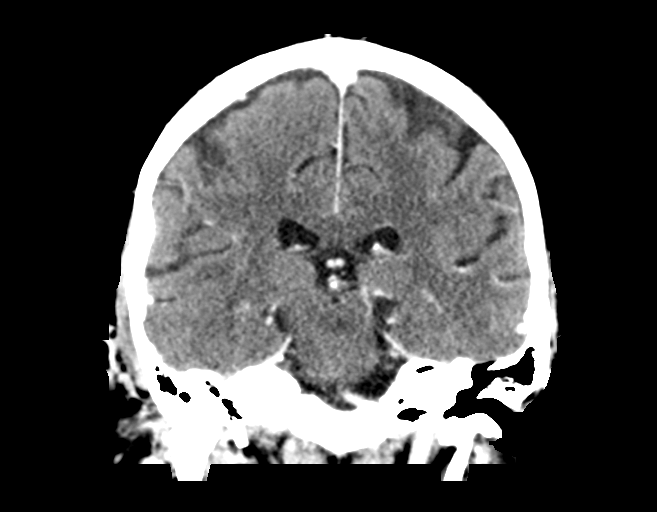
[im 36/65  brain]
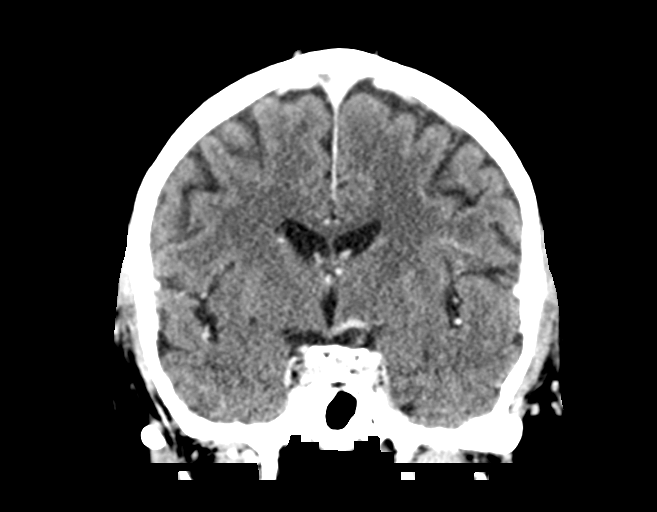

[Series 6: sagittal soft tissue · sagittal · 0.35mm/px · 3 of 58 slices shown]
[im 20/58  brain]
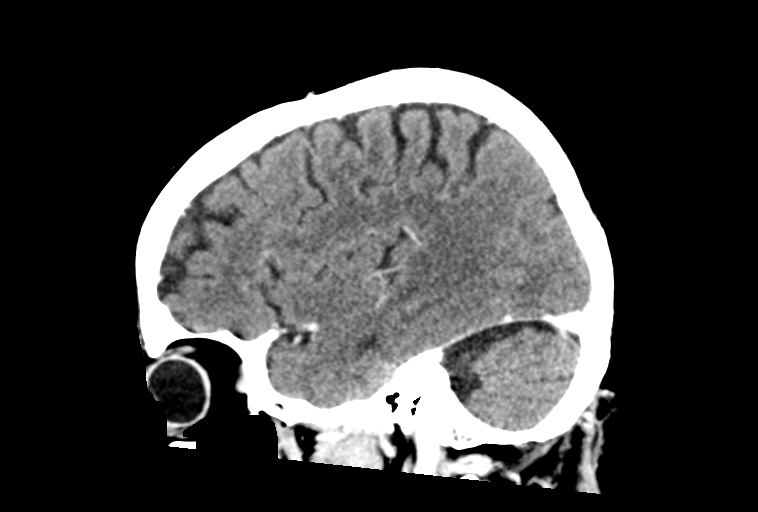
[im 29/58  brain]
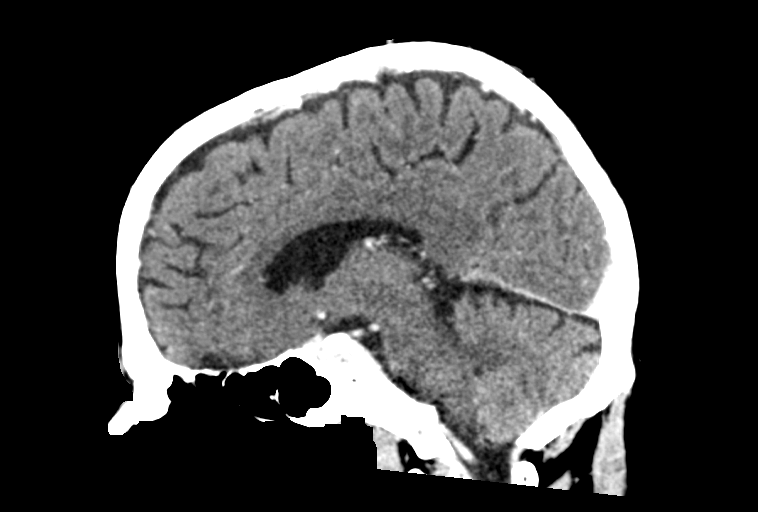
[im 39/58  brain]
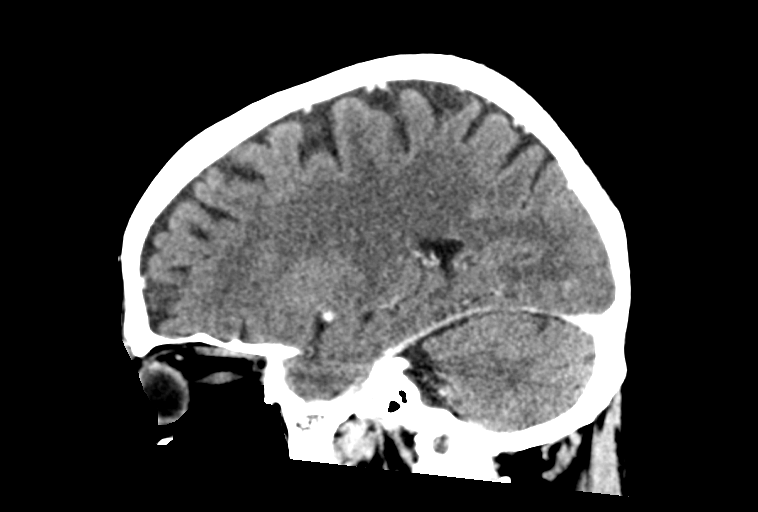

[14 of 47 positions shown; findings below may reference images not displayed]

RADIATION DOSE REDUCTION: This exam was performed according to the
departmental dose-optimization program which includes automated
exposure control, adjustment of the mA and/or kV according to
patient size and/or use of iterative reconstruction technique.

CONTRAST:  75mL OMNIPAQUE IOHEXOL 300 MG/ML  SOLN
FINDINGS: Brain: There is no mass, hemorrhage or extra-axial collection. The
size and configuration of the ventricles and extra-axial CSF spaces
are normal. The brain parenchyma is normal, without acute or chronic
infarction. No abnormal enhancement.

Vascular: No abnormal hyperdensity of the major intracranial
arteries or dural venous sinuses. No intracranial atherosclerosis.

Skull: The visualized skull base, calvarium and extracranial soft
tissues are normal.

Sinuses/Orbits: No fluid levels or advanced mucosal thickening of
the visualized paranasal sinuses. No mastoid or middle ear effusion.
The orbits are normal.
IMPRESSION: Normal head CT.

## 2023-12-30 IMAGING — XA IR INFUSION THROMBOL ARTERIAL INITIAL (MS)
2 series · 4 of 4 positions shown · non-contrast
Comparison: CT 12/24/2021

INDICATION: 76-year-old male with submassive pulmonary embolism presents for
catheter directed thrombolysis

EXAM:
ULTRASOUND-GUIDED ACCESS RIGHT COMMON FEMORAL VEIN
PULMONARY ANGIOGRAM
PLACEMENT OF BILATERAL LYTIC CATHETERS FOR INITIATION CATHETER
DIRECTED THROMBOLYSIS
TECHNIQUE: Informed consent was obtained from the patient and the patient's
family following explanation of the procedure, risks, benefits and
alternatives. Specific risks include bleeding, infection, contrast
reaction, kidney injury, venous injury, life-threatening hemorrhage
including brain hemorrhage, gastrointestinal hemorrhage, epistaxis,
need for further surgery, need for further procedure,
cardiopulmonary collapse, death. The patient understands, agrees and
consents for the procedure. All questions were addressed.

[Series 1: ir (id) (id)/(id)/(id) ir · 1 of 1 slices shown]
[im 1/1]
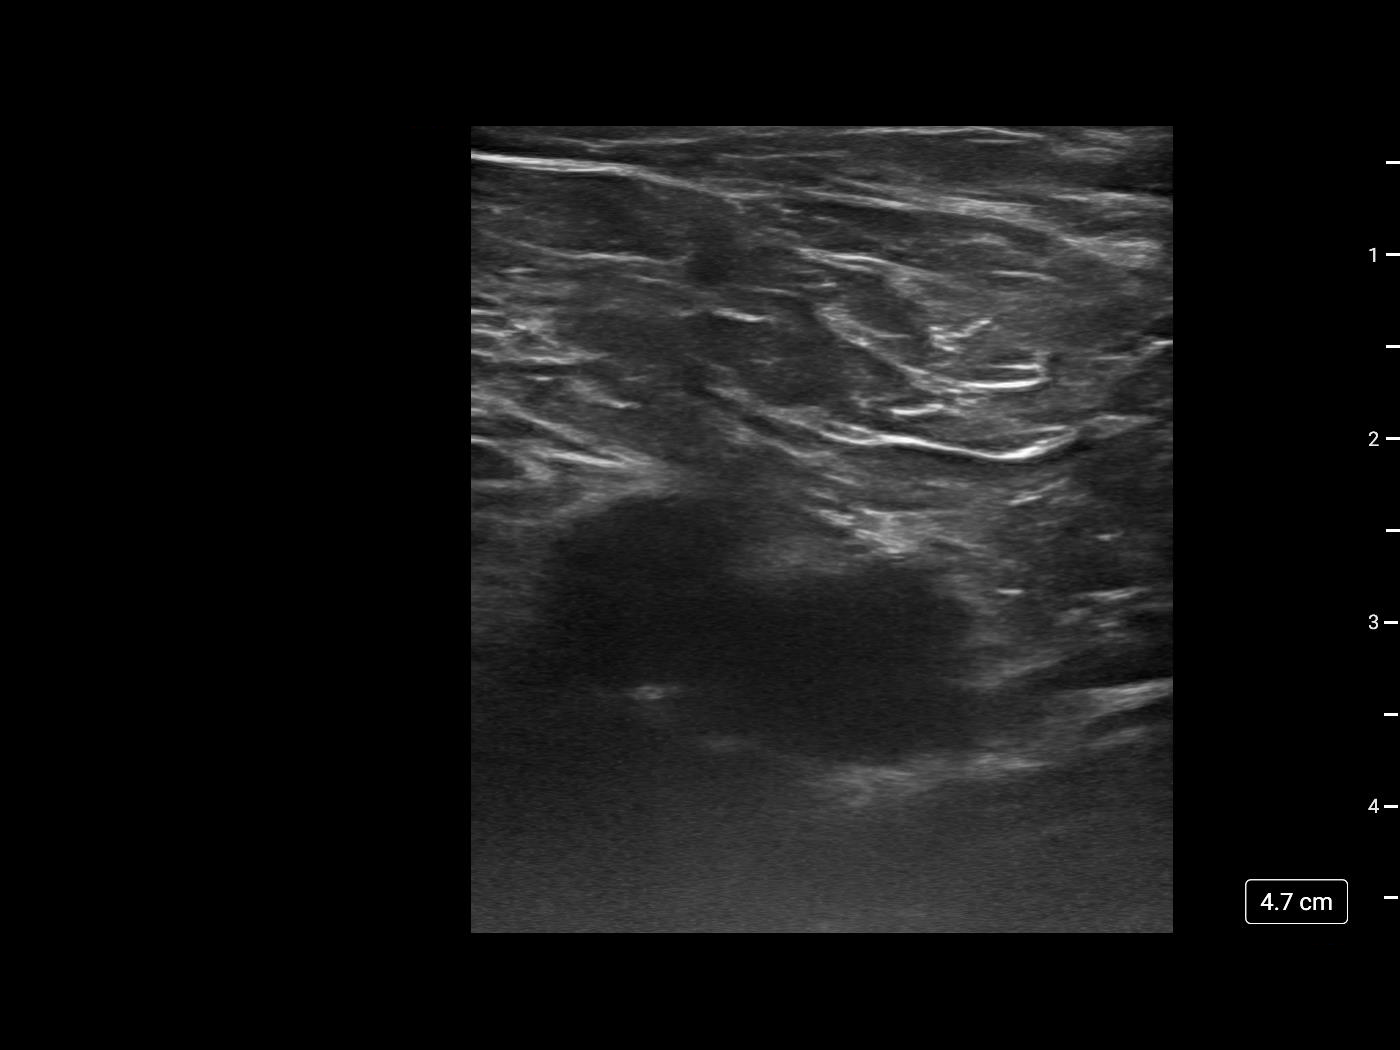

[Series 1: processed: processed: ir infusion thromb · arterial · 3 of 15 frames shown]
[frame 3/15]
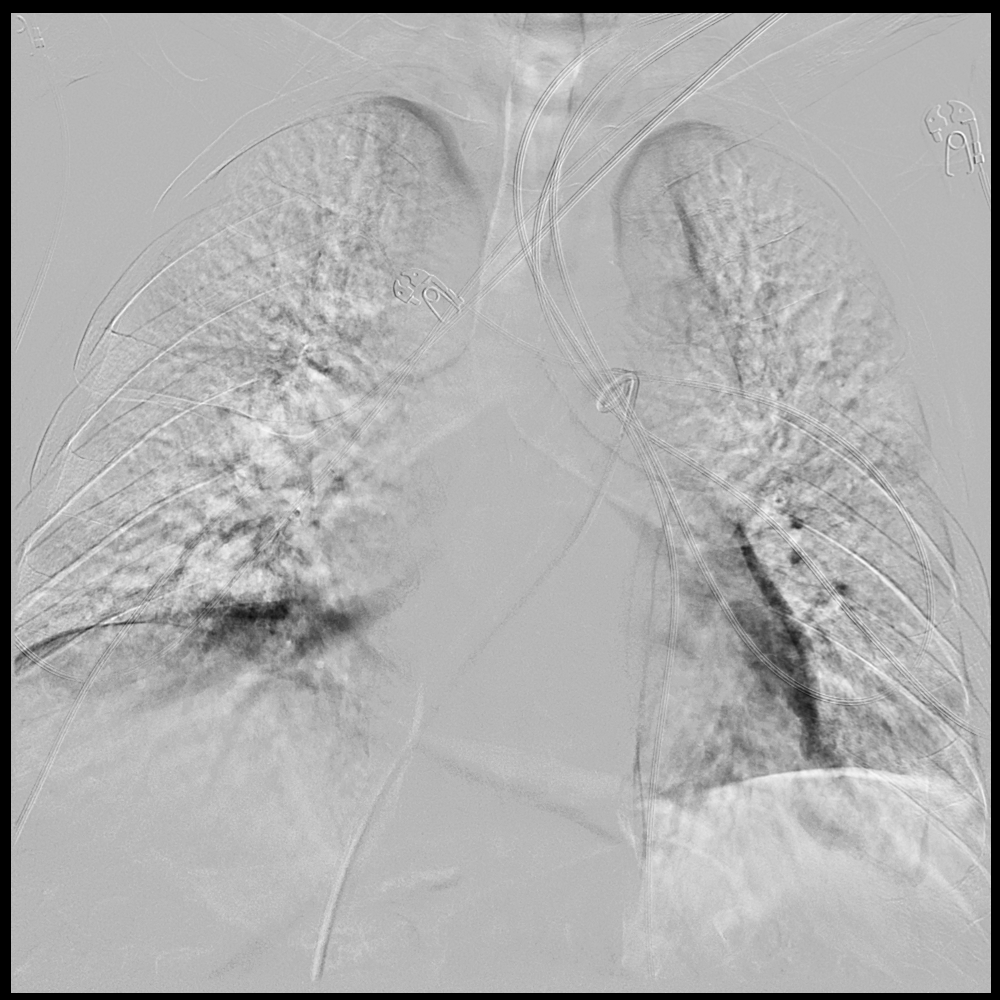
[frame 8/15]
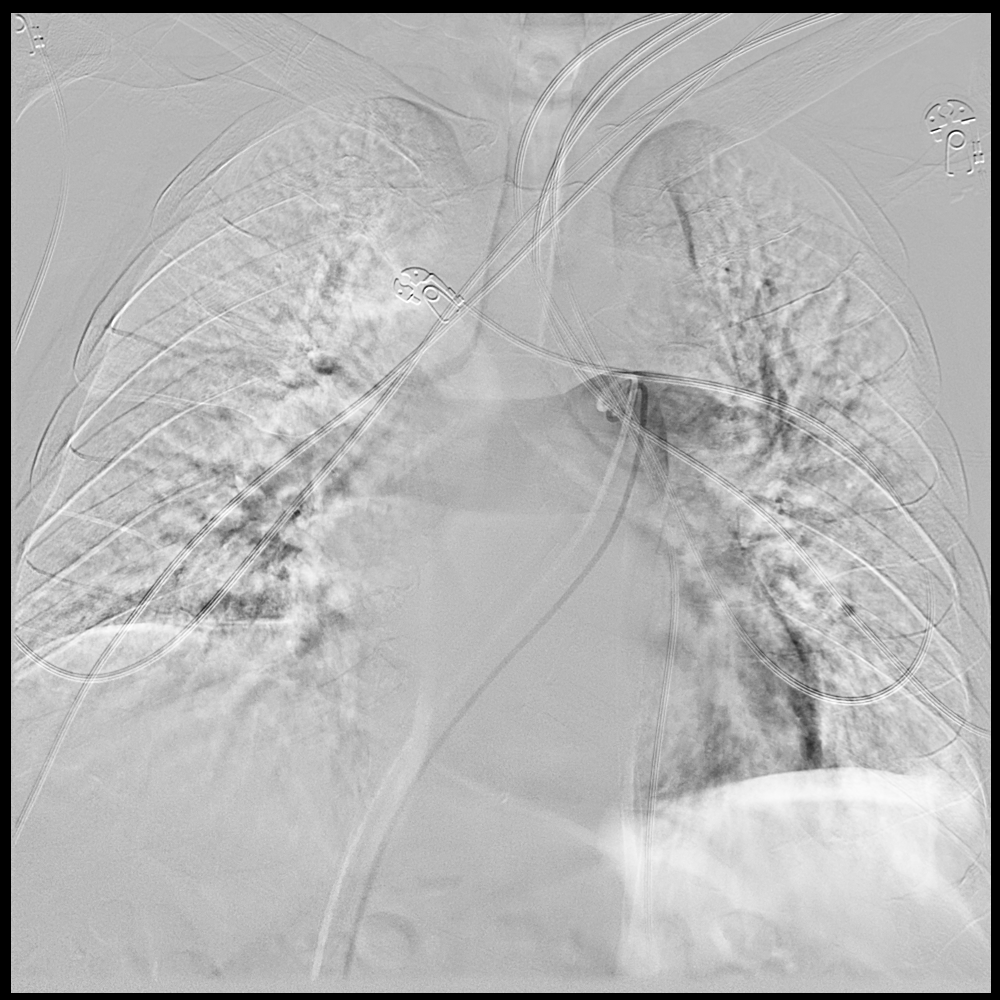
[frame 13/15]
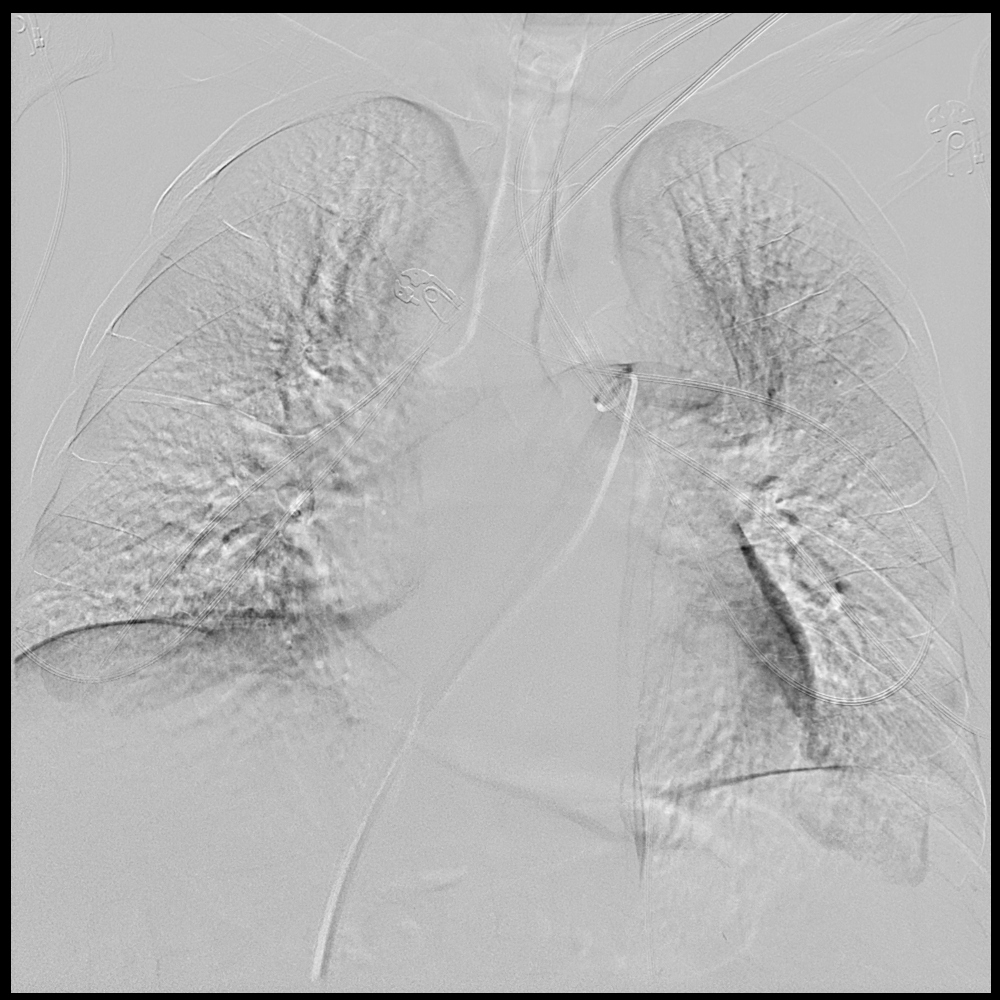

[4 of 4 positions shown; findings below may reference images not displayed]

MEDICATIONS:
None.

ANESTHESIA/SEDATION:
Moderate (conscious) sedation was employed during this procedure. A
total of Versed 0.5 mg and Fentanyl 25 mcg was administered
intravenously by the radiology nurse.

Total intra-service moderate Sedation Time: 50 minutes. The
patient's level of consciousness and vital signs were monitored
continuously by radiology nursing throughout the procedure under my
direct supervision.

FLUOROSCOPY:
Radiation Exposure Index (as provided by the fluoroscopic device):
191 mGy Kerma

COMPLICATIONS:
None
Patient is position supine position on the fluoroscopy table.
Maximal barrier sterile technique utilized including caps, mask,
sterile gowns, sterile gloves, large sterile drape, hand hygiene,
and chlorhexidine prep. 1% lidocaine used for local anesthesia.
Moderate sedation was provided.

Ultrasound survey of the right inguinal region was performed with
images stored and sent to PACs, confirming patency of the right
common femoral vein.

A single wall needle was used access the right common femoral vein
under ultrasound. With excellent blood flow returned, 035 wire was
passed through the needle, observed to enter the IVC under
fluoroscopy. The needle was removed, and a standard 6 French
vascular sheath was placed. The dilator was removed and the sheath
was flushed.

Single wall needle was again used access the right common femoral
vein under ultrasound, adjacent to the initial puncture. With
excellent blood flow returned, 035 wire was passed through the
needle, observed to enter the IVC under fluoroscopy. The needle was
removed, and a standard 6 French vascular sheath was placed. The
dilator was removed and the sheath was flushed.

Combination of a angled pigtail catheter and a Bentson wire was then
used to navigate to the right heart. Angled catheter used to select
the main pulmonary artery. Wire was removed and angiogram was
performed.

Pressure measurement was obtained.

Stiff Glidewire was then advanced through the pigtail catheter,
reducing the curve and flipping into the right-sided pulmonary
artery. The wire was straightened into the downgoing branches of the
right pulmonary artery. Pigtail catheter was then removed and a 105
cm infusion length 90cm working length UniFuse catheter was placed.
Small contrast infusion confirmed position. Catheter was flushed.

The alternative sheath was then used for selection of the
contralateral pulmonary artery. Pigtail catheter was advanced into
the right heart and used to select the main pulmonary artery. Again
the exchange stiff Glidewire was used to reduce the curved and
removed the pigtail catheter. A vertebral shaped 100 cm diagnostic
catheter was then used to select the left-sided pulmonary arteries.
Once the catheter was positioned into the lower lobar branches,
contrast confirmed location. A 15 cm infusion length 90cm working
length UniFuse catheter was placed. Wire was removed, small amount
of contrast confirmed position and catheter was flushed.

The obturator wires were then placed through both the left and right
catheters.

Sheaths were secured in position.  Final image was stored.

The patient tolerated the procedure well and remained
hemodynamically stable throughout.

No complications were encountered and no significant blood loss was
encountered.
FINDINGS: Ultrasound survey demonstrates patent right common femoral vein.

Main pulmonary artery pressure measures 41/14 (24).

Final image demonstrates right-sided catheter directed into lower
lobar branches and left sided catheter directed into lower lobar
branches.

The rightward sheath transmits the left-sided catheter. The leftward
sheath transmits the right-sided pulmonary artery catheter.
IMPRESSION: Status post US guided right CFV access and placement of left and
right pulmonary artery lytic catheters for initiation of catheter
directed thrombolysis.

## 2023-12-30 IMAGING — CT CT ANGIO CHEST-ABD-PELV FOR DISSECTION W/ AND WO/W CM
2 of 7 series · 13 of 46 positions shown, 15 images · non-contrast
Comparison: 12/17/2007

CLINICAL DATA: Acute chest pain radiating to lower abdomen since
yesterday, short of breath, near syncope

EXAM:
CT ANGIOGRAPHY CHEST, ABDOMEN AND PELVIS
TECHNIQUE: Non-contrast CT of the chest was initially obtained.

[Series 6: axial arterial · axial · arterial · 0.88mm/px · z∈[+681,+1296]mm · 10 of 235 slices shown, 12 images]
[im 15/235  soft-tissue]
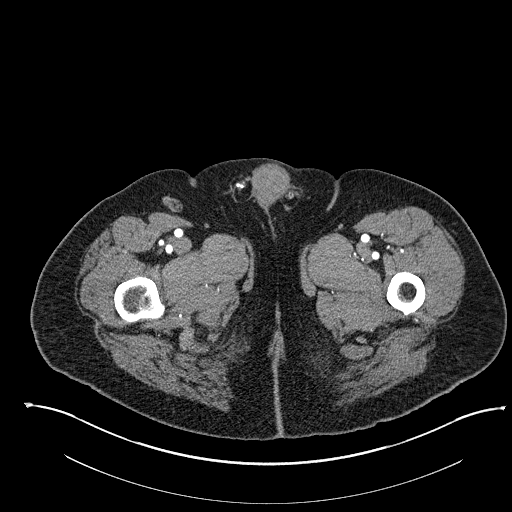
[im 15/235  bone]
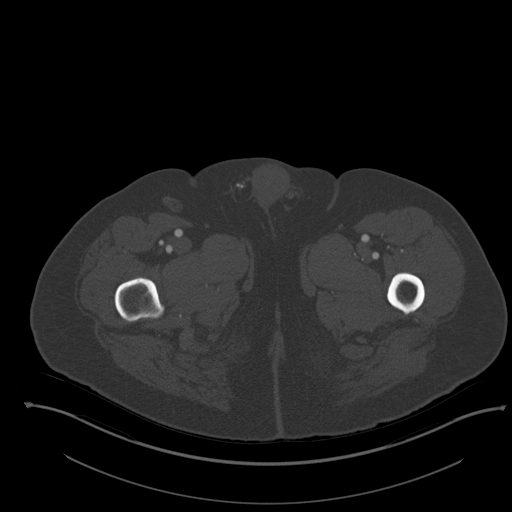
[im 44/235  soft-tissue]
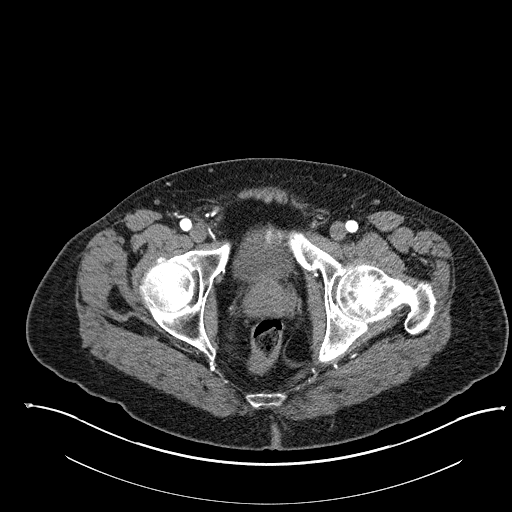
[im 59/235  soft-tissue]
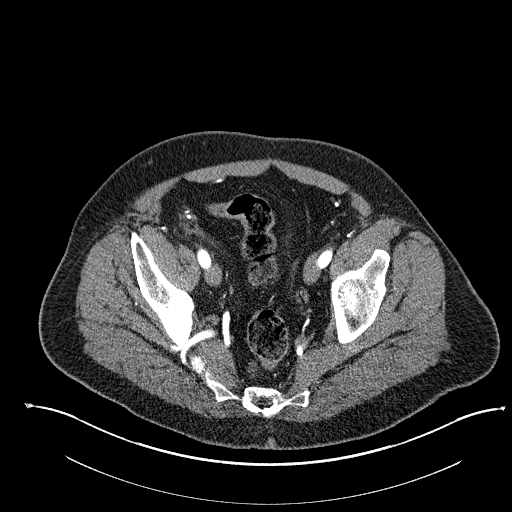
[im 88/235  soft-tissue]
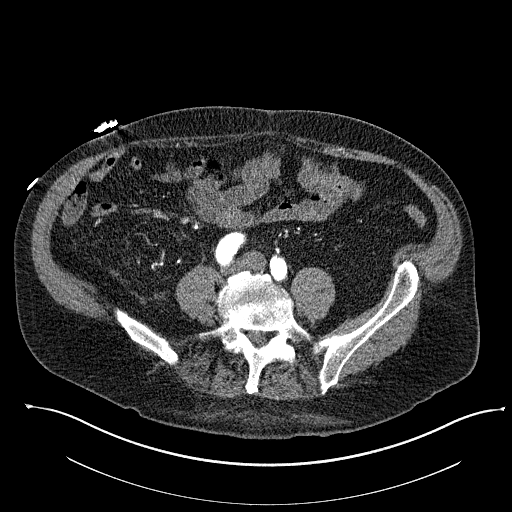
[im 103/235  soft-tissue]
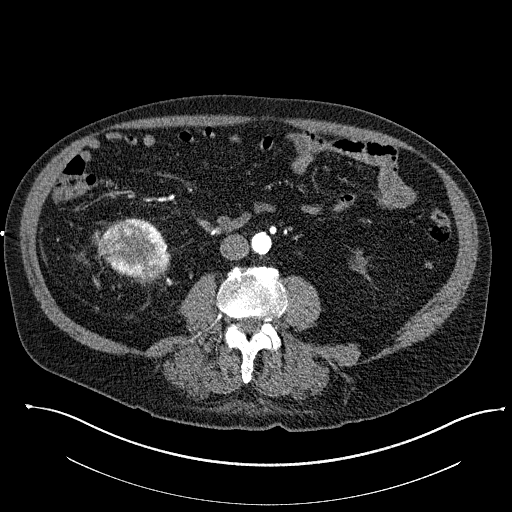
[im 132/235  soft-tissue]
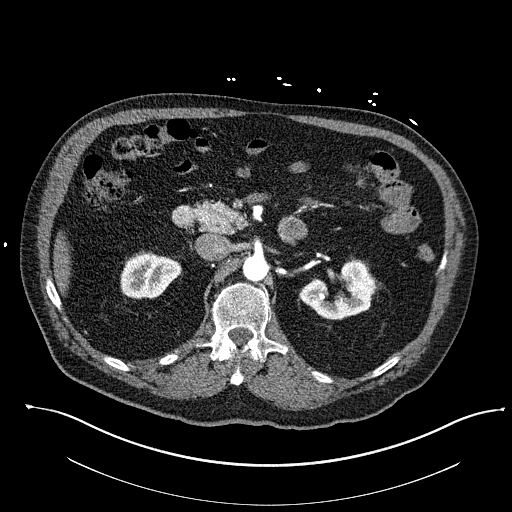
[im 147/235  soft-tissue]
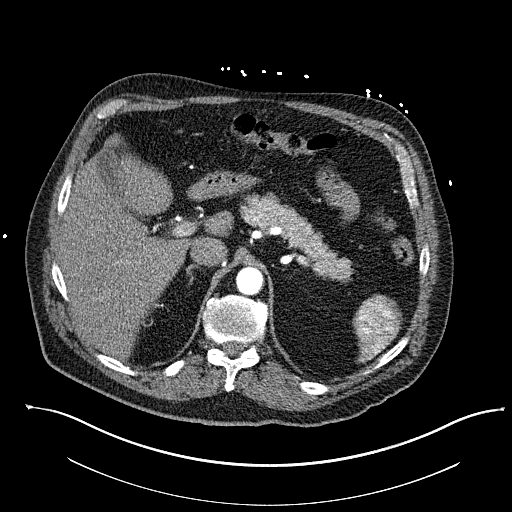
[im 176/235  soft-tissue]
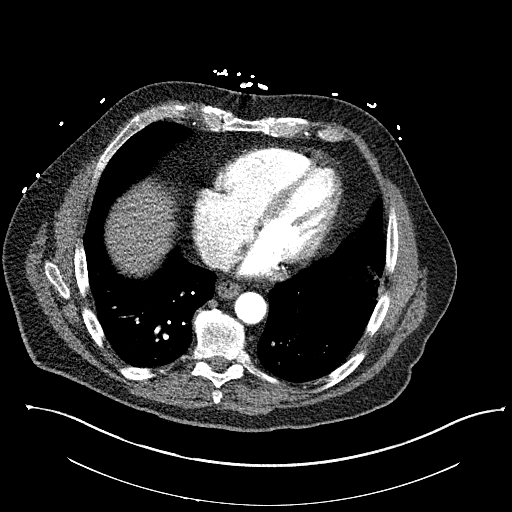
[im 191/235  soft-tissue]
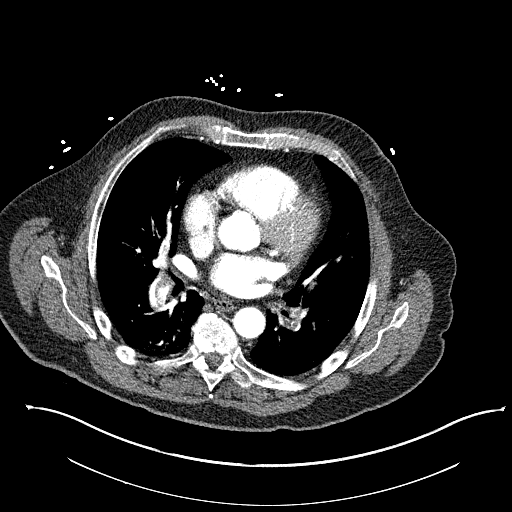
[im 191/235  bone]
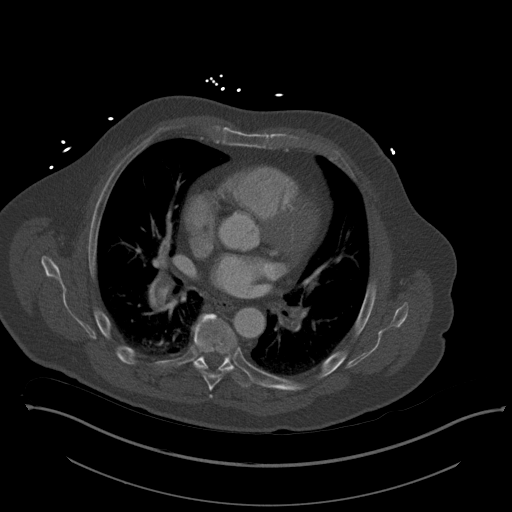
[im 220/235  soft-tissue]
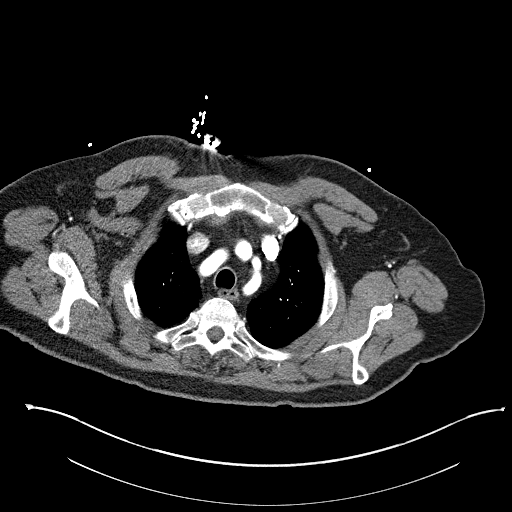

[Series 7: coronals · coronal · 0.99mm/px · 3 of 162 slices shown]
[im 41/162  soft-tissue]
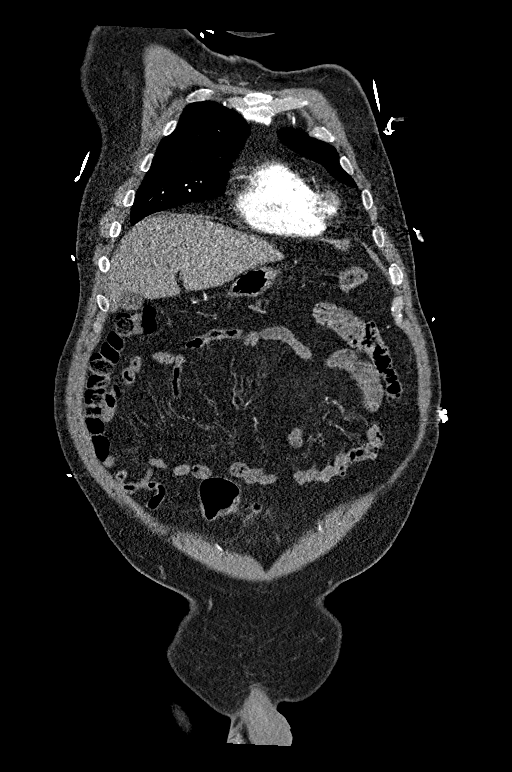
[im 81/162  soft-tissue]
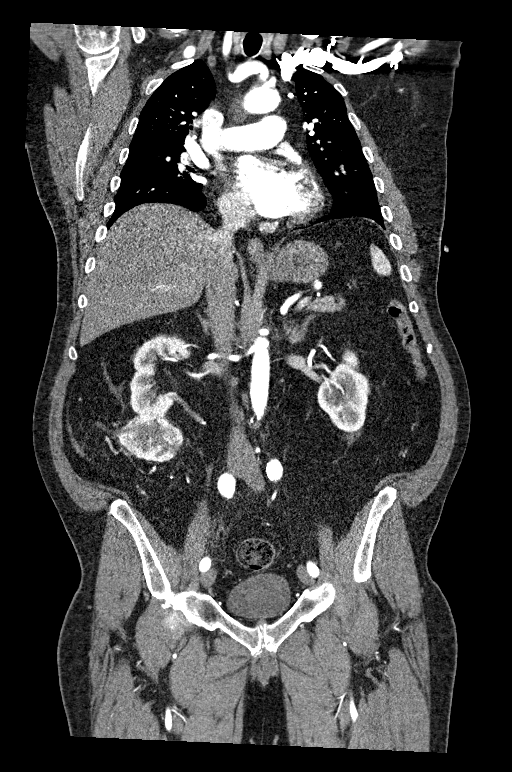
[im 121/162  soft-tissue]
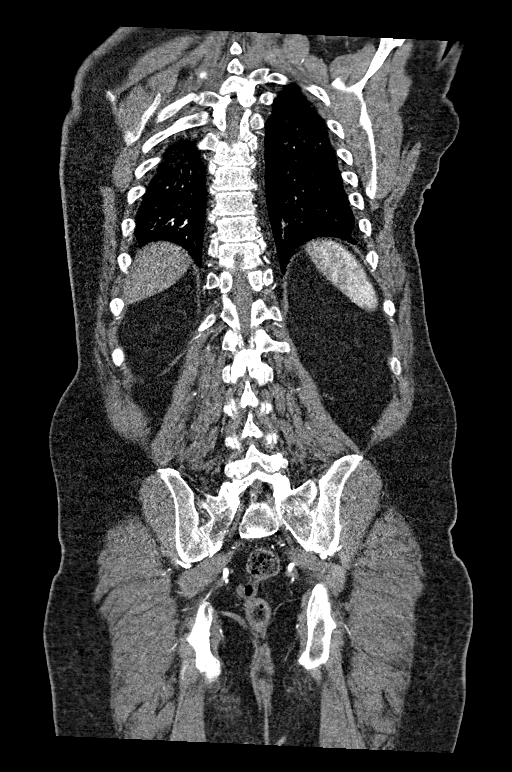

[13 of 46 positions shown; findings below may reference images not displayed]

Multidetector CT imaging through the chest, abdomen and pelvis was
performed using the standard protocol during bolus administration of
intravenous contrast. Multiplanar reconstructed images and MIPs were
obtained and reviewed to evaluate the vascular anatomy.

RADIATION DOSE REDUCTION: This exam was performed according to the
departmental dose-optimization program which includes automated
exposure control, adjustment of the mA and/or kV according to
patient size and/or use of iterative reconstruction technique.

CONTRAST:  100mL OMNIPAQUE IOHEXOL 350 MG/ML SOLN
FINDINGS: CTA CHEST FINDINGS

Cardiovascular: The thoracic aorta is unremarkable without aneurysm
or dissection.

There is adequate opacification of the pulmonary vasculature, with
large saddle pulmonary embolus identified. There is significant clot
burden, with a dilated RV/LV ratio measuring approximately
consistent with right heart strain.

No pericardial effusion.  No significant atherosclerosis.

Mediastinum/Nodes: No enlarged mediastinal, hilar, or axillary lymph
nodes. Thyroid gland, trachea, and esophagus demonstrate no
significant findings.

Lungs/Pleura: No acute airspace disease, effusion, or pneumothorax.
Central airways are widely patent.

Musculoskeletal: No acute or destructive bony lesions. Reconstructed
images demonstrate no additional findings.

Review of the MIP images confirms the above findings.

CTA ABDOMEN AND PELVIS FINDINGS

VASCULAR

Aorta: Normal caliber aorta without aneurysm, dissection, vasculitis
or significant stenosis. Minimal atherosclerosis.

Celiac: Patent without evidence of aneurysm, dissection, vasculitis
or significant stenosis.

SMA: Patent without evidence of aneurysm, dissection, vasculitis or
significant stenosis.

Renals: Duplicated left renal artery. Bilateral renal arteries are
patent without evidence of aneurysm, dissection, vasculitis,
fibromuscular dysplasia, or significant stenosis.

IMA: Patent without evidence of aneurysm, dissection, vasculitis or
significant stenosis.

Inflow: Patent without evidence of aneurysm, dissection, vasculitis
or significant stenosis. Minimal atherosclerosis.

Veins: No obvious venous abnormality within the limitations of this
arterial phase study.

Review of the MIP images confirms the above findings.

NON-VASCULAR

Hepatobiliary: No focal liver abnormality is seen. No gallstones,
gallbladder wall thickening, or biliary dilatation.

Pancreas: Unremarkable. No pancreatic ductal dilatation or
surrounding inflammatory changes.

Spleen: Normal in size without focal abnormality.

Adrenals/Urinary Tract: There is a heterogeneous solid mass arising
from the lower pole right kidney measuring 7.0 x 5.9 x 4.7 cm,
consistent with renal cell carcinoma. No extension into the right
renal pelvis or right renal vasculature. Significant
neovascularization supplying the right renal mass from lower lobar
branches of the right renal artery.

In addition, there is a 2.4 by 2.5 x 2.2 cm solid mass within the
mid left kidney, also suspicious for renal cell carcinoma. This does
not involve the left renal hilum or left renal vasculature.

The adrenals are unremarkable.  Bladder is grossly normal.

Stomach/Bowel: No bowel obstruction or ileus. Diverticulosis of the
descending and sigmoid colon without diverticulitis. Normal
retrocecal appendix. No bowel wall thickening or inflammatory
change.

Lymphatic: No pathologic adenopathy within the abdomen or pelvis.

Reproductive: Prostate is unremarkable.

Other: No free fluid or free intraperitoneal gas. No abdominal wall
hernia.

Musculoskeletal: No acute or destructive bony lesions. Reconstructed
images demonstrate no additional findings.

Review of the MIP images confirms the above findings.
IMPRESSION: Vascular:

1. Large saddle pulmonary embolus with significant clot burden.
Positive for acute PE with CT evidence of right heart strain (RV/LV
Ratio = 1.17) consistent with at least submassive (intermediate
risk) PE. The presence of right heart strain has been associated
with an increased risk of morbidity and mortality. Please refer to
the "PE Focused" order set in [REDACTED].
2. No evidence of thoracoabdominal aortic aneurysm or dissection.
3.  Aortic Atherosclerosis ([id number]).

Nonvascular:

1. Bilateral renal masses consistent with renal cell carcinoma,
measuring up to 7 cm on the right and 2.5 cm on the left.
2. Distal colonic diverticulosis without diverticulitis.

Critical Value/emergent results were called by telephone at the time
of interpretation on 12/24/2021 at [DATE] to provider AMNON TIGER
, who verbally acknowledged these results.

## 2023-12-31 DIAGNOSIS — G4733 Obstructive sleep apnea (adult) (pediatric): Secondary | ICD-10-CM | POA: Diagnosis not present

## 2023-12-31 DIAGNOSIS — F5104 Psychophysiologic insomnia: Secondary | ICD-10-CM | POA: Diagnosis not present

## 2023-12-31 DIAGNOSIS — I10 Essential (primary) hypertension: Secondary | ICD-10-CM | POA: Diagnosis not present

## 2023-12-31 DIAGNOSIS — I4891 Unspecified atrial fibrillation: Secondary | ICD-10-CM | POA: Diagnosis not present

## 2024-01-14 ENCOUNTER — Ambulatory Visit
Admission: RE | Admit: 2024-01-14 | Discharge: 2024-01-14 | Disposition: A | Discharge status: Home / Self Care | Source: Ambulatory Visit | Attending: Interventional Radiology | Admitting: Interventional Radiology

## 2024-01-14 DIAGNOSIS — D3002 Benign neoplasm of left kidney: Secondary | ICD-10-CM | POA: Diagnosis not present

## 2024-01-14 HISTORY — PX: IR RADIOLOGIST EVAL & MGMT: IMG5224

## 2024-01-14 NOTE — Progress Notes (Signed)
 Chief Complaint: Patient was consulted remotely today (TeleHealth) for follow up of a left renal oncocytoma.  History of Present Illness: Stanley Juarez is a 78 y.o. 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 Ultrasound-guided biopsy of the mass was performed on 11/03/2022 with good visualization of the mass by ultrasound.  Mass dimensions at that time by ultrasound were estimated to be approximately 3.8 x 3.5 x 3.5 cm. 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 MRI without contrast on 02/20/23 demonstrated a stable roughly 3 cm neoplasm. He had two follow up MRI studies on 08/29/23 and 12/28/23 demonstrating stable size of the oncocytoma. He remains asymptomatic with no urinary complaints or pain.  Past Medical History:  Diagnosis Date   Arthritis    back   Atrial fibrillation (HCC)    Cancer (HCC)    Chronic kidney failure    DVT (deep venous thrombosis) (HCC)    History of pulmonary embolus (PE)    Hypertension    Pneumonia    Proteinuria    Sleep apnea     Past Surgical History:  Procedure Laterality Date   CARDIOVERSION N/A 09/29/2022   Procedure: CARDIOVERSION;  Surgeon: Elmyra Haggard, MD;  Location: [facility] ENDOSCOPY;  Service: Cardiovascular;  Laterality: N/A;   CATARACT EXTRACTION     IR ANGIOGRAM PULMONARY BILATERAL SELECTIVE  12/25/2021   IR INFUSION THROMBOL ARTERIAL INITIAL (MS)  12/25/2021   IR INFUSION THROMBOL ARTERIAL INITIAL (MS)  12/25/2021   IR RADIOLOGIST EVAL & MGMT  01/30/2022   IR RADIOLOGIST EVAL & MGMT  02/18/2023   IR THROMB F/U EVAL ART/VEN FINAL DAY (MS)  12/25/2021   IR US  GUIDE VASC ACCESS RIGHT  12/25/2021   IR US  GUIDE VASC ACCESS RIGHT  12/25/2021   MENISCUS REPAIR Right    ROBOT ASSISTED LAPAROSCOPIC NEPHRECTOMY Right 05/09/2022   Procedure: XI ROBOTIC ASSISTED LAPAROSCOPIC NEPHRECTOMY;  Surgeon:  Adelbert Homans, MD;  Location: [facility] ORS;  Service: Urology;  Laterality: Right;  ONLY NEEDS 180 MIN   ROTATOR CUFF REPAIR Right    skin cancer removal      Allergies: Amiodarone , Prednisone, and Penicillins  Medications: Prior to Admission medications   Medication Sig Start Date End Date Taking? Authorizing Provider  ARTIFICIAL TEAR SOLUTION OP Place 1 drop into both eyes 2 (two) times daily.    [provider]  azelastine  (ASTELIN ) 0.1 % nasal spray Place 2 sprays into both nostrils 2 (two) times daily. Use in each nostril as directed 03/18/23   Soldatova, Liuba, MD  cyclobenzaprine  (FLEXERIL ) 5 MG tablet Take 5 mg by mouth as needed for muscle spasms.    [provider]  diltiazem  (CARDIZEM  CD) 240 MG 24 hr capsule TAKE 1 CAPSULE EVERY DAY 08/24/23   Hugh Madura, MD  metoprolol  tartrate (LOPRESSOR ) 25 MG tablet TAKE 1 TABLET TWICE DAILY 08/24/23   Hugh Madura, MD  oxymetazoline  (AFRIN NASAL SPRAY) 0.05 % nasal spray Place 1 spray into both nostrils 2 (two) times daily. Do not use Afrin longer than 3 days and use it only for epistaxis 03/18/23   Soldatova, Liuba, MD  rivaroxaban  (XARELTO ) 20 MG TABS tablet Take 1 tablet (20 mg total) by mouth daily with supper. 01/18/22   Macdonald Savoy, MD  sodium chloride  (OCEAN) 0.65 % SOLN nasal spray Place 1 spray into  both nostrils as needed for congestion. 03/18/23   Soldatova, Liuba, MD  traZODone  (DESYREL ) 100 MG tablet Take 100 mg by mouth at bedtime. 12/03/21   [provider]     Family History  Problem Relation Age of Onset   Cancer Brother    Cancer Brother     Social History   Socioeconomic History   Marital status: Married    Spouse name: Not on file   Number of children: Not on file   Years of education: Not on file   Highest education level: Not on file  Occupational History   Not on file  Tobacco Use   Smoking status: Never   Smokeless tobacco: Never   Tobacco comments:    Never  smoke 10/06/22   Vaping Use   Vaping status: Never Used  Substance and Sexual Activity   Alcohol use: Never   Drug use: Never   Sexual activity: Not on file  Other Topics Concern   Not on file  Social History Narrative   Not on file   Social Drivers of Health   Financial Resource Strain: Not on file  Food Insecurity: No Food Insecurity (08/28/2022)   Hunger Vital Sign    Worried About Running Out of Food in the Last Year: Never true    Ran Out of Food in the Last Year: Never true  Transportation Needs: No Transportation Needs (08/28/2022)   PRAPARE - Administrator, Civil Service (Medical): No    Lack of Transportation (Non-Medical): No  Physical Activity: Not on file  Stress: Not on file  Social Connections: Not on file    Review of Systems  Constitutional: Negative.   Respiratory: Negative.    Cardiovascular: Negative.   Gastrointestinal: Negative.   Genitourinary: Negative.   Musculoskeletal: Negative.   Neurological: Negative.     Review of Systems: A 12 point ROS discussed and pertinent positives are indicated in the HPI above.  All other systems are negative.     Physical Exam No direct physical exam was performed (except for noted visual exam findings with Video Visits).   Vital Signs: There were no vitals taken for this visit.  Imaging: MR ABDOMEN WWO CONTRAST Result Date: 12/29/2023 CLINICAL DATA:  Renal oncocytoma of left kidney, status post right nephrectomy EXAM: MRI ABDOMEN WITHOUT AND WITH CONTRAST TECHNIQUE: Multiplanar multisequence MR imaging of the abdomen was performed both before and after the administration of intravenous contrast. CONTRAST:  10mL GADAVIST  GADOBUTROL  1 MMOL/ML IV SOLN COMPARISON:  08/29/2023 FINDINGS: Lower chest: No acute abnormality. Hepatobiliary: No solid liver abnormality is seen. No gallstones, gallbladder wall thickening, or biliary dilatation. Pancreas: Unchanged, definitively benign 0.9 cm fluid signal cystic  lesion in the superior pancreatic head neck junction, requiring no further follow-up or characterization (series 20, image 16). No pancreatic ductal dilatation or surrounding inflammatory changes. Spleen: Normal in size without significant abnormality. Adrenals/Urinary Tract: Adrenal glands are unremarkable. Status post right nephrectomy. No suspicious soft tissue or contrast enhancement in the nephrectomy bed. Unchanged brightly enhancing mass of the peripheral midportion of the left kidney measuring 3.2 x 2.9 cm (series 20, image 27). Additional subcentimeter fluid signal cysts, requiring no further follow-up or characterization. No obvious calculi. No hydronephrosis. Stomach/Bowel: Stomach is within normal limits. No evidence of bowel wall thickening, distention, or inflammatory changes. Occasional colonic diverticula. Vascular/Lymphatic: No significant vascular findings are present. No enlarged abdominal lymph nodes. Other: No abdominal wall hernia or abnormality. No ascites. Musculoskeletal: No acute or significant  osseous findings. IMPRESSION: 1. Unchanged brightly enhancing mass of the peripheral midportion of the left kidney measuring 3.2 x 2.9 cm, previously biopsied. 2. Status post right nephrectomy. No suspicious soft tissue or contrast enhancement in the nephrectomy bed. 3. No evidence of lymphadenopathy or metastatic disease in the abdomen. 4. No acute findings in the abdomen. Electronically Signed   By: Fredricka Jenny M.D.   On: 12/29/2023 12:17    Labs:  CBC: No results for input(s): "WBC", "HGB", "HCT", "PLT" in the last 8760 hours.  COAGS: No results for input(s): "INR", "APTT" in the last 8760 hours.  BMP: Recent Labs    06/01/23 0926  NA 141  K 4.9  CL 105  CO2 24  GLUCOSE 100*  BUN 28*  CALCIUM 9.6  CREATININE 1.65*    Assessment and Plan:  I spoke with Stanley Juarez by phone and reviewed MRI findings with him. Unfortunately, he inadvertently had two MRI studies performed  this year due to one being ordered by Dr. Sherrine Dolly and one by myself. Both show stable size of the left renal oncocytoma since MRI last year. It probably is slightly larger than by CT on 12/24/21 where it measured 2.5-2.6 cm. Given prior biopsy result and evidence of slow growth, I recommended a follow up MRI without contrast in 2 years given his CKD and solitary kidney.    Electronically Signed: Aileen Alexanders 01/14/2024, 9:24 AM    I spent a total of 10 Minutes in remote  clinical consultation, greater than 50% of which was counseling/coordinating care for a left renal oncocytoma.    Visit type: Audio only (telephone). Audio (no video) only due to patient's lack of internet/smartphone capability. Alternative for in-person consultation at [facility], [address] [place], [place], [place]. This visit type was conducted due to national recommendations for restrictions regarding the COVID-19 Pandemic (e.g. social distancing).  This format is felt to be most appropriate for this patient at this time.  All issues noted in this document were discussed and addressed.

## 2024-01-20 ENCOUNTER — Encounter: Payer: Self-pay | Admitting: Internal Medicine

## 2024-03-28 DIAGNOSIS — R7303 Prediabetes: Secondary | ICD-10-CM | POA: Diagnosis not present

## 2024-03-28 DIAGNOSIS — Z1331 Encounter for screening for depression: Secondary | ICD-10-CM | POA: Diagnosis not present

## 2024-03-28 DIAGNOSIS — I4891 Unspecified atrial fibrillation: Secondary | ICD-10-CM | POA: Diagnosis not present

## 2024-03-28 DIAGNOSIS — G4733 Obstructive sleep apnea (adult) (pediatric): Secondary | ICD-10-CM | POA: Diagnosis not present

## 2024-03-28 DIAGNOSIS — N183 Chronic kidney disease, stage 3 unspecified: Secondary | ICD-10-CM | POA: Diagnosis not present

## 2024-03-28 DIAGNOSIS — Z23 Encounter for immunization: Secondary | ICD-10-CM | POA: Diagnosis not present

## 2024-03-28 DIAGNOSIS — R040 Epistaxis: Secondary | ICD-10-CM | POA: Diagnosis not present

## 2024-03-28 DIAGNOSIS — M109 Gout, unspecified: Secondary | ICD-10-CM | POA: Diagnosis not present

## 2024-03-28 DIAGNOSIS — I10 Essential (primary) hypertension: Secondary | ICD-10-CM | POA: Diagnosis not present

## 2024-03-28 DIAGNOSIS — Z0000 Encounter for general adult medical examination without abnormal findings: Secondary | ICD-10-CM | POA: Diagnosis not present

## 2024-03-28 LAB — LAB REPORT - SCANNED
Albumin, Urine POC: 1.56
Creatinine, POC: 79 mg/dL
EGFR: 44
Microalb Creat Ratio: 19.8

## 2024-03-29 ENCOUNTER — Ambulatory Visit: Payer: Self-pay

## 2024-04-01 ENCOUNTER — Other Ambulatory Visit: Payer: Self-pay | Admitting: Cardiology

## 2024-04-01 DIAGNOSIS — I10 Essential (primary) hypertension: Secondary | ICD-10-CM | POA: Diagnosis not present

## 2024-04-01 DIAGNOSIS — I4891 Unspecified atrial fibrillation: Secondary | ICD-10-CM | POA: Diagnosis not present

## 2024-04-01 DIAGNOSIS — N183 Chronic kidney disease, stage 3 unspecified: Secondary | ICD-10-CM | POA: Diagnosis not present

## 2024-04-05 DIAGNOSIS — R040 Epistaxis: Secondary | ICD-10-CM | POA: Diagnosis not present

## 2024-04-05 DIAGNOSIS — G4733 Obstructive sleep apnea (adult) (pediatric): Secondary | ICD-10-CM | POA: Diagnosis not present

## 2024-04-24 DIAGNOSIS — N183 Chronic kidney disease, stage 3 unspecified: Secondary | ICD-10-CM | POA: Diagnosis not present

## 2024-04-24 DIAGNOSIS — E669 Obesity, unspecified: Secondary | ICD-10-CM | POA: Diagnosis not present

## 2024-04-24 DIAGNOSIS — I4891 Unspecified atrial fibrillation: Secondary | ICD-10-CM | POA: Diagnosis not present

## 2024-04-24 DIAGNOSIS — I10 Essential (primary) hypertension: Secondary | ICD-10-CM | POA: Diagnosis not present

## 2024-04-30 DIAGNOSIS — I4891 Unspecified atrial fibrillation: Secondary | ICD-10-CM | POA: Diagnosis not present

## 2024-04-30 DIAGNOSIS — N183 Chronic kidney disease, stage 3 unspecified: Secondary | ICD-10-CM | POA: Diagnosis not present

## 2024-04-30 DIAGNOSIS — I10 Essential (primary) hypertension: Secondary | ICD-10-CM | POA: Diagnosis not present

## 2024-05-04 DIAGNOSIS — R040 Epistaxis: Secondary | ICD-10-CM | POA: Diagnosis not present

## 2024-05-04 DIAGNOSIS — I129 Hypertensive chronic kidney disease with stage 1 through stage 4 chronic kidney disease, or unspecified chronic kidney disease: Secondary | ICD-10-CM | POA: Diagnosis not present

## 2024-05-04 DIAGNOSIS — N2889 Other specified disorders of kidney and ureter: Secondary | ICD-10-CM | POA: Diagnosis not present

## 2024-05-04 DIAGNOSIS — R001 Bradycardia, unspecified: Secondary | ICD-10-CM | POA: Diagnosis not present

## 2024-05-04 DIAGNOSIS — E875 Hyperkalemia: Secondary | ICD-10-CM | POA: Diagnosis not present

## 2024-05-04 DIAGNOSIS — Z905 Acquired absence of kidney: Secondary | ICD-10-CM | POA: Diagnosis not present

## 2024-05-04 DIAGNOSIS — R809 Proteinuria, unspecified: Secondary | ICD-10-CM | POA: Diagnosis not present

## 2024-05-04 DIAGNOSIS — N1832 Chronic kidney disease, stage 3b: Secondary | ICD-10-CM | POA: Diagnosis not present

## 2024-05-24 DIAGNOSIS — I4891 Unspecified atrial fibrillation: Secondary | ICD-10-CM | POA: Diagnosis not present

## 2024-05-24 DIAGNOSIS — E669 Obesity, unspecified: Secondary | ICD-10-CM | POA: Diagnosis not present

## 2024-05-24 DIAGNOSIS — I10 Essential (primary) hypertension: Secondary | ICD-10-CM | POA: Diagnosis not present

## 2024-05-24 DIAGNOSIS — N183 Chronic kidney disease, stage 3 unspecified: Secondary | ICD-10-CM | POA: Diagnosis not present

## 2024-05-24 NOTE — Progress Notes (Signed)
 Cardiology Office Note:  .   Date:  05/31/2024  ID:  Stanley Juarez, DOB Aug 09, 1946, MRN [id number] PCP: Loreli Kins, MD  [facility] Providers Cardiologist:  Oneil Parchment, MD    History of Present Illness: .   HENCE DERRICK is a 78 y.o. 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 He is currently on Xarelto , Diltiazem  240 mg daily, and Metoprolol  25 mg twice a day.  Patient comes in with his wife. Overall he's been doing well. He says his pulse is in the 50's in am when he get up but comes up to 60-70's when moving around. Denies chest pain, palpitations, dyspnea, dizziness, edema. Uses CPAP nightly. He tires more easily. 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 Has occasional leg swelling but eating out fast food some. Wears compression stockings and walking helps.   ROS:    Studies Reviewed: SABRA         Prior CV Studies:    Monitor 12/2022   Sinus rhythm with average heart rate 57 bpm.  First-degree AV block present.  Occasional sinus bradycardia. On diltiazem  240mg    Occasional PVCs 3.1%   No evidence of atrial fibrillation.  Post cardioversion.     Patch Wear Time:  13 days and 23 hours (2024-04-18T14:22:01-399 to 2024-05-02T14:22:09-398)   Patient had a min HR of 37 bpm, max HR of 139 bpm, and avg HR of 57 bpm. Predominant underlying rhythm was Sinus Rhythm. First Degree AV Block was present. 3 Supraventricular Tachycardia runs occurred, the run with the fastest interval lasting 7 beats  with a max rate of 139 bpm, the longest lasting 6 beats with an avg rate of 106 bpm. Isolated SVEs were rare (<1.0%), SVE Couplets were rare (<1.0%), and no SVE Triplets were present.  Isolated VEs were occasional (3.1%, 35418), VE Couplets were rare  (<1.0%, 61), and no VE Triplets were present. Ventricular Bigeminy and Trigeminy were present.  Echo 08/2022 IMPRESSIONS     1. Left ventricular ejection fraction, by estimation, is 55 to 60%. There  is mild concentric left ventricular hypertrophy.   2. Right ventricular systolic function is normal. The right ventricular  size is normal.   3. The mitral valve is normal in structure. No evidence of mitral valve  regurgitation.   4. The aortic valve was not well visualized. Aortic valve regurgitation  is not visualized.   5. The inferior vena cava is normal in size with greater than 50%  respiratory variability, suggesting right atrial pressure of 3 mmHg.   Risk Assessment/Calculations:    CHA2DS2-VASc Score = 4   This indicates a 4.8% annual risk of stroke. The patient's score is based upon: CHF History: 0 HTN History: 1 Diabetes History: 0 Stroke History: 0 Vascular Disease History: 1 Age Score: 2 Gender Score: 0            Physical Exam:   VS:  BP 130/80    Orhtostatics: No data found. Wt Readings from Last 3 Encounters:  09/14/23 247 lb 6.4 oz (112.2 kg)  06/01/23 243 lb (110.2 kg)  03/18/23 240 lb (108.9 kg)    GEN: Obese, in no acute distress NECK: No JVD; No carotid bruits CARDIAC:  RRR, no murmurs, rubs, gallops  RESPIRATORY:  Clear to auscultation without rales, wheezing or rhonchi  ABDOMEN: Soft, non-tender, non-distended EXTREMITIES:  trace edema; No deformity   ASSESSMENT AND PLAN: .    Parox Atrial Fibrillation/sinus bracycardia/fatigue Maintaining sinus rhythm on Diltiazem  240mg  daily and Metoprolol  25mg  twice daily. No symptoms of heart failure but slower HR's and fatigue. Will try to decrease metoprolol  12.5 mg bid to see if this helps. He will monitor BP and HR at home.     Chronic Venous Insufficiency No current symptoms of fluid overload despite high fluid intake to help promote  weight loss. -Continue current management strategies including elevating legs when sitting and compression stockings -2 gm sodium diet   Hyperkalemia Previous high potassium level, possibly related to diet. Recent levels within normal range. -Continue current dietary modifications.   Pulmonary Embolism History of submassive PE treated with catheter-directed thrombolysis. Currently on Xarelto . -Continue Xarelto .   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 No current issues reported.          Dispo: f/u in 6 months.  Signed, Olivia Pavy, PA-C

## 2024-05-30 DIAGNOSIS — I10 Essential (primary) hypertension: Secondary | ICD-10-CM | POA: Diagnosis not present

## 2024-05-30 DIAGNOSIS — I4891 Unspecified atrial fibrillation: Secondary | ICD-10-CM | POA: Diagnosis not present

## 2024-05-30 DIAGNOSIS — N183 Chronic kidney disease, stage 3 unspecified: Secondary | ICD-10-CM | POA: Diagnosis not present

## 2024-05-31 ENCOUNTER — Encounter: Payer: Self-pay | Admitting: Physician Assistant

## 2024-05-31 ENCOUNTER — Ambulatory Visit: Attending: Physician Assistant | Admitting: Physician Assistant

## 2024-05-31 VITALS — BP 130/80 | Wt 245.0 lb

## 2024-05-31 DIAGNOSIS — I480 Paroxysmal atrial fibrillation: Secondary | ICD-10-CM

## 2024-05-31 DIAGNOSIS — I872 Venous insufficiency (chronic) (peripheral): Secondary | ICD-10-CM

## 2024-05-31 MED ORDER — METOPROLOL TARTRATE 25 MG PO TABS: 12.5000 mg | ORAL_TABLET | Freq: Two times a day (BID) | ORAL | Status: DC

## 2024-05-31 NOTE — Patient Instructions (Signed)
 Medication Instructions:  DECREASE metoprolol  to half tablet twice daily   *If you need a refill on your cardiac medications before your next appointment, please call your pharmacy*  Blood Pressure Record Sheet To take your blood pressure, you will need a blood pressure machine. You can buy a blood pressure machine (blood pressure monitor) at your clinic, drug store, or online. When choosing one, consider: An automatic monitor that has an arm cuff. A cuff that wraps snugly around your upper arm. You should be able to fit only one finger between your arm and the cuff. A device that stores blood pressure reading results. Do not choose a monitor that measures your blood pressure from your wrist or finger. Follow your health care provider's instructions for how to take your blood pressure. To use this form: Take your blood pressure medications every day These measurements should be taken when you have been at rest for at least 10-15 min Take at least 2 readings with each blood pressure check. This makes sure the results are correct. Wait 1-2 minutes between measurements. Write down the results in the spaces on this form. Keep in mind it should always be recorded systolic over diastolic. Both numbers are important.  Repeat this every day for 2-3 weeks, or as told by your health care provider.  Make a follow-up appointment with your health care provider to discuss the results.  Blood Pressure Log Date Medications taken? (Y/N) Blood Pressure Time of Day                                                                                                         Follow-Up: At [facility], you and your health needs are our priority.  As part of our continuing mission to provide you with exceptional heart care, our providers are all part of one team.  This team includes your primary Cardiologist (physician) and Advanced Practice Providers or APPs (Physician Assistants and Nurse  Practitioners) who all work together to provide you with the care you need, when you need it.  Your next appointment:   6 month(s)  Provider:   Oneil Parchment, MD    We recommend signing up for the patient portal called MyChart.  Sign up information is provided on this After Visit Summary.  MyChart is used to connect with patients for Virtual Visits (Telemedicine).  Patients are able to view lab/test results, encounter notes, upcoming appointments, etc.  Non-urgent messages can be sent to your provider as well.   To learn more about what you can do with MyChart, go to [email or web address].   Other Instructions Two Gram Sodium Diet 2000 mg  What is Sodium? Sodium is a mineral found naturally in many foods. The most significant source of sodium in the diet is table salt, which is about 40% sodium.  Processed, convenience, and preserved foods also contain a large amount of sodium.  The body needs only 500 mg of sodium daily to function,  A normal diet provides more than enough sodium even if you do not use salt.  Why  Limit Sodium? A build up of sodium in the body can cause thirst, increased blood pressure, shortness of breath, and water  retention.  Decreasing sodium in the diet can reduce edema and risk of heart attack or stroke associated with high blood pressure.  Keep in mind that there are many other factors involved in these health problems.  Heredity, obesity, lack of exercise, cigarette smoking, stress and what you eat all play a role.  General Guidelines: Do not add salt at the table or in cooking.  One teaspoon of salt contains over 2 grams of sodium. Read food labels Avoid processed and convenience foods Ask your dietitian before eating any foods not dicussed in the menu planning guidelines Consult your physician if you wish to use a salt substitute or a sodium containing medication such as antacids.  Limit milk and milk products to 16 oz (2 cups) per day.  Shopping Hints: READ  LABELS!! Dietetic does not necessarily mean low sodium. Salt and other sodium ingredients are often added to foods during processing.    Menu Planning Guidelines Food Group Choose More Often Avoid  Beverages (see also the milk group All fruit juices, low-sodium, salt-free vegetables juices, low-sodium carbonated beverages Regular vegetable or tomato juices, commercially softened water  used for drinking or cooking  Breads and Cereals Enriched white, wheat, rye and pumpernickel bread, hard rolls and dinner rolls; muffins, cornbread and waffles; most dry cereals, cooked cereal without added salt; unsalted crackers and breadsticks; low sodium or homemade bread crumbs Bread, rolls and crackers with salted tops; quick breads; instant hot cereals; pancakes; commercial bread stuffing; self-rising flower and biscuit mixes; regular bread crumbs or cracker crumbs  Desserts and Sweets Desserts and sweets mad with mild should be within allowance Instant pudding mixes and cake mixes  Fats Butter or margarine; vegetable oils; unsalted salad dressings, regular salad dressings limited to 1 Tbs; light, sour and heavy cream Regular salad dressings containing bacon fat, bacon bits, and salt pork; snack dips made with instant soup mixes or processed cheese; salted nuts  Fruits Most fresh, frozen and canned fruits Fruits processed with salt or sodium-containing ingredient (some dried fruits are processed with sodium sulfites        Vegetables Fresh, frozen vegetables and low- sodium canned vegetables Regular canned vegetables, sauerkraut, pickled vegetables, and others prepared in brine; frozen vegetables in sauces; vegetables seasoned with ham, bacon or salt pork  Condiments, Sauces, Miscellaneous  Salt substitute with physician's approval; pepper, herbs, spices; vinegar, lemon or lime juice; hot pepper sauce; garlic powder, onion powder, low sodium soy sauce (1 Tbs.); low sodium condiments (ketchup, chili sauce,  mustard) in limited amounts (1 tsp.) fresh ground horseradish; unsalted tortilla chips, pretzels, potato chips, popcorn, salsa (1/4 cup) Any seasoning made with salt including garlic salt, celery salt, onion salt, and seasoned salt; sea salt, rock salt, kosher salt; meat tenderizers; monosodium glutamate; mustard, regular soy sauce, barbecue, sauce, chili sauce, teriyaki sauce, steak sauce, Worcestershire sauce, and most flavored vinegars; canned gravy and mixes; regular condiments; salted snack foods, olives, picles, relish, horseradish sauce, catsup   Food preparation: Try these seasonings Meats:    Pork Sage, onion Serve with applesauce  Chicken Poultry seasoning, thyme, parsley Serve with cranberry sauce  Lamb Curry powder, rosemary, garlic, thyme Serve with mint sauce or jelly  Veal Marjoram, basil Serve with current jelly, cranberry sauce  Beef Pepper, bay leaf Serve with dry mustard, unsalted chive butter  Fish [place] leaf, dill Serve with unsalted lemon butter, unsalted  parsley butter  Vegetables:    Asparagus Lemon juice   Broccoli Lemon juice   Carrots Mustard dressing parsley, mint, nutmeg, glazed with unsalted butter and sugar   Green beans Marjoram, lemon juice, nutmeg,dill seed   Tomatoes Basil, marjoram, onion   Spice /blend for [employer]  4 tsp ground thyme 1 tsp ground sage 3 tsp ground rosemary 4 tsp ground marjoram   Test your knowledge A product that says Salt Free may still contain sodium. True or False Garlic Powder and Hot Pepper Sauce an be used as alternative seasonings.True or False Processed foods have more sodium than fresh foods.  True or False Canned Vegetables have less sodium than froze True or False   WAYS TO DECREASE YOUR SODIUM INTAKE Avoid the use of added salt in cooking and at the table.  Table salt (and other prepared seasonings which contain salt) is probably one of the greatest sources of sodium in the diet.  Unsalted foods can gain flavor from  the sweet, sour, and butter taste sensations of herbs and spices.  Instead of using salt for seasoning, try the following seasonings with the foods listed.  Remember: how you use them to enhance natural food flavors is limited only by your creativity... Allspice-Meat, fish, eggs, fruit, peas, red and yellow vegetables Almond Extract-Fruit baked goods Anise Seed-Sweet breads, fruit, carrots, beets, cottage cheese, cookies (tastes like licorice) Basil-Meat, fish, eggs, vegetables, rice, vegetables salads, soups, sauces Bay Leaf-Meat, fish, stews, poultry Burnet-Salad, vegetables (cucumber-like flavor) Caraway Seed-Bread, cookies, cottage cheese, meat, vegetables, cheese, rice Cardamon-Baked goods, fruit, soups Celery Powder or seed-Salads, salad dressings, sauces, meatloaf, soup, bread.Do not use  celery salt Chervil-Meats, salads, fish, eggs, vegetables, cottage cheese (parsley-like flavor) Chili Power-Meatloaf, chicken cheese, corn, eggplant, egg dishes Chives-Salads cottage cheese, egg dishes, soups, vegetables, sauces Cilantro-Salsa, casseroles Cinnamon-Baked goods, fruit, pork, lamb, chicken, carrots Cloves-Fruit, baked goods, fish, pot roast, green beans, beets, carrots Coriander-Pastry, cookies, meat, salads, cheese (lemon-orange flavor) Cumin-Meatloaf, fish,cheese, eggs, cabbage,fruit pie (caraway flavor) [employer], fruit, eggs, fish, poultry, cottage cheese, vegetables Dill Seed-Meat, cottage cheese, poultry, vegetables, fish, salads, bread Fennel Seed-Bread, cookies, apples, pork, eggs, fish, beets, cabbage, cheese, Licorice-like flavor Garlic-(buds or powder) Salads, meat, poultry, fish, bread, butter, vegetables, potatoes.Do not  use garlic salt Ginger-Fruit, vegetables, baked goods, meat, fish, poultry Horseradish Root-Meet, vegetables, butter Lemon Juice or Extract-Vegetables, fruit, tea, baked goods, fish salads Mace-Baked goods fruit, vegetables, fish, poultry (taste  like nutmeg) Maple Extract-Syrups Marjoram-Meat, chicken, fish, vegetables, breads, green salads (taste like Sage) Mint-Tea, lamb, sherbet, vegetables, desserts, carrots, cabbage Mustard, Dry or Seed-Cheese, eggs, meats, vegetables, poultry Nutmeg-Baked goods, fruit, chicken, eggs, vegetables, desserts Onion Powder-Meat, fish, poultry, vegetables, cheese, eggs, bread, rice salads (Do not use   Onion salt) Orange Extract-Desserts, baked goods Oregano-Pasta, eggs, cheese, onions, pork, lamb, fish, chicken, vegetables, green salads Paprika-Meat, fish, poultry, eggs, cheese, vegetables Parsley Flakes-Butter, vegetables, meat fish, poultry, eggs, bread, salads (certain forms may   Contain sodium Pepper-Meat fish, poultry, vegetables, eggs Peppermint Extract-Desserts, baked goods Poppy Seed-Eggs, bread, cheese, fruit dressings, baked goods, noodles, vegetables, cottage  [employer], poultry, meat, fish, cauliflower, turnips,eggs bread Saffron-Rice, bread, veal, chicken, fish, eggs Sage-Meat, fish, poultry, onions, eggplant, tomateos, pork, stews Savory-Eggs, salads, poultry, meat, rice, vegetables, soups, pork Tarragon-Meat, poultry, fish, eggs, butter, vegetables (licorice-like flavor)  Thyme-Meat, poultry, fish, eggs, vegetables, (clover-like flavor), sauces, soups Tumeric-Salads, butter, eggs, fish, rice, vegetables (saffron-like flavor) Vanilla Extract-Baked goods, candy Vinegar-Salads, vegetables, meat marinades Walnut Extract-baked goods, candy  2. Choose your Foods Wisely   The following is a list of foods to avoid which are high in sodium:  Meats-Avoid all smoked, canned, salt cured, dried and kosher meat and fish as well as Anchovies   [employer] meats:Bologna, Liverwurst, Pastrami Canned meat or fish  Marinated herring Caviar    Pepperoni Corned Beef   Pizza Dried chipped beef  Salami Frozen breaded fish or meat Salt  pork Frankfurters or hot dogs  Sardines Gefilte fish   Sausage Ham (boiled ham, Proscuitto Smoked butt    spiced ham)   Spam      TV Dinners Vegetables Canned vegetables (Regular) Relish Canned mushrooms  Sauerkraut Olives    Tomato juice Pickles  Bakery and Dessert Products Canned puddings  Cream pies Cheesecake   Decorated cakes Cookies  Beverages/Juices Tomato juice, regular  Gatorade   V-8 vegetable juice, regular  Breads and Cereals Biscuit mixes   Salted potato chips, corn chips, pretzels Bread stuffing mixes  Salted crackers and rolls Pancake and waffle mixes Self-rising flour  Seasonings Accent    Meat sauces Barbecue sauce  Meat tenderizer Catsup    Monosodium glutamate (MSG) Celery salt   Onion salt Chili sauce   Prepared mustard Garlic salt   Salt, seasoned salt, sea salt Gravy mixes   Soy sauce Horseradish   Steak sauce Ketchup   Tartar sauce Lite salt    Teriyaki sauce Marinade mixes   Worcestershire sauce  Others Baking powder   Cocoa and cocoa mixes Baking soda   Commercial casserole mixes Candy-caramels, chocolate  Dehydrated soups    Bars, fudge,nougats  Instant rice and pasta mixes Canned broth or soup  Maraschino cherries Cheese, aged and processed cheese and cheese spreads  Learning Assessment Quiz  Indicated T (for True) or F (for False) for each of the following statements:  _____ Fresh fruits and vegetables and unprocessed grains are generally low in sodium _____ Water  may contain a considerable amount of sodium, depending on the source _____ You can always tell if a food is high in sodium by tasting it _____ Certain laxatives my be high in sodium and should be avoided unless prescribed   by a physician or pharmacist _____ Salt substitutes may be used freely by anyone on a sodium restricted diet _____ Sodium is present in table salt, food additives and as a natural component of   most foods _____ Table salt is approximately 90%  sodium _____ Limiting sodium intake may help prevent excess fluid accumulation in the body _____ On a sodium-restricted diet, seasonings such as bouillon soy sauce, and    cooking wine should be used in place of table salt _____ On an ingredient list, a product which lists monosodium glutamate as the first   ingredient is an appropriate food to include on a low sodium diet  Circle the best answer(s) to the following statements (Hint: there may be more than one correct answer)  11. On a low-sodium diet, some acceptable snack items are:    A. Olives  F. Bean dip   K. Grapefruit juice    B. Salted Pretzels G. Commercial Popcorn   L. Canned peaches    C. Carrot Sticks  H. Bouillon   M. Unsalted nuts   D. [place] fries  I. Peanut butter crackers N. Salami   E. Sweet pickles J. Tomato Juice   O. Pizza  12.  Seasonings that may be used freely on a reduced - sodium  diet include   A. Lemon wedges F.Monosodium glutamate K. Celery seed    B.Soysauce   G. Pepper   L. Mustard powder   C. Sea salt  H. Cooking wine  M. Onion flakes   D. Vinegar  E. Prepared horseradish N. Salsa   E. Sage   J. Worcestershire sauce  O. Chutney

## 2024-06-24 DIAGNOSIS — I4891 Unspecified atrial fibrillation: Secondary | ICD-10-CM | POA: Diagnosis not present

## 2024-06-24 DIAGNOSIS — I10 Essential (primary) hypertension: Secondary | ICD-10-CM | POA: Diagnosis not present

## 2024-06-24 DIAGNOSIS — E669 Obesity, unspecified: Secondary | ICD-10-CM | POA: Diagnosis not present

## 2024-06-24 DIAGNOSIS — N183 Chronic kidney disease, stage 3 unspecified: Secondary | ICD-10-CM | POA: Diagnosis not present

## 2024-06-29 DIAGNOSIS — I4891 Unspecified atrial fibrillation: Secondary | ICD-10-CM | POA: Diagnosis not present

## 2024-06-29 DIAGNOSIS — N183 Chronic kidney disease, stage 3 unspecified: Secondary | ICD-10-CM | POA: Diagnosis not present

## 2024-06-29 DIAGNOSIS — I10 Essential (primary) hypertension: Secondary | ICD-10-CM | POA: Diagnosis not present

## 2024-07-07 ENCOUNTER — Other Ambulatory Visit: Payer: Self-pay | Admitting: Urology

## 2024-07-07 DIAGNOSIS — D49512 Neoplasm of unspecified behavior of left kidney: Secondary | ICD-10-CM

## 2024-07-08 ENCOUNTER — Other Ambulatory Visit: Payer: Self-pay | Admitting: Urology

## 2024-07-24 DIAGNOSIS — N183 Chronic kidney disease, stage 3 unspecified: Secondary | ICD-10-CM | POA: Diagnosis not present

## 2024-07-24 DIAGNOSIS — E669 Obesity, unspecified: Secondary | ICD-10-CM | POA: Diagnosis not present

## 2024-07-24 DIAGNOSIS — I4891 Unspecified atrial fibrillation: Secondary | ICD-10-CM | POA: Diagnosis not present

## 2024-07-24 DIAGNOSIS — I10 Essential (primary) hypertension: Secondary | ICD-10-CM | POA: Diagnosis not present

## 2024-08-14 ENCOUNTER — Other Ambulatory Visit: Payer: Self-pay | Admitting: Cardiology

## 2024-09-28 LAB — LAB REPORT - SCANNED
A1c: 5.5
EGFR: 44

## 2024-09-30 ENCOUNTER — Encounter: Payer: Self-pay | Admitting: Neurology

## 2024-11-02 ENCOUNTER — Ambulatory Visit: Payer: Self-pay | Admitting: Neurology
